# Patient Record
Sex: Female | Born: 1970 | Race: Black or African American | Hispanic: No | Marital: Single | Smoking: Never smoker
Health system: Southern US, Community
[De-identification: ages and names within clinical notes are randomized; demographics above are authoritative.]

## PROBLEM LIST (undated history)

## (undated) DIAGNOSIS — R112 Nausea with vomiting, unspecified: Secondary | ICD-10-CM

## (undated) DIAGNOSIS — T7840XA Allergy, unspecified, initial encounter: Secondary | ICD-10-CM

## (undated) DIAGNOSIS — E785 Hyperlipidemia, unspecified: Secondary | ICD-10-CM

## (undated) DIAGNOSIS — Z9889 Other specified postprocedural states: Secondary | ICD-10-CM

## (undated) DIAGNOSIS — D259 Leiomyoma of uterus, unspecified: Secondary | ICD-10-CM

## (undated) HISTORY — DX: Allergy, unspecified, initial encounter: T78.40XA

## (undated) HISTORY — PX: BREAST LUMPECTOMY: SHX2

## (undated) HISTORY — PX: BREAST EXCISIONAL BIOPSY: SUR124

---

## 1989-12-09 HISTORY — PX: BREAST LUMPECTOMY: SHX2

## 1991-12-10 HISTORY — PX: BREAST EXCISIONAL BIOPSY: SUR124

## 2005-08-28 ENCOUNTER — Observation Stay: Payer: Self-pay | Admitting: Obstetrics and Gynecology

## 2005-08-29 ENCOUNTER — Ambulatory Visit: Payer: Self-pay

## 2005-12-15 ENCOUNTER — Inpatient Hospital Stay: Payer: Self-pay | Admitting: Obstetrics and Gynecology

## 2006-02-06 ENCOUNTER — Encounter: Payer: Self-pay | Admitting: Neurology

## 2006-03-09 ENCOUNTER — Encounter: Payer: Self-pay | Admitting: Neurology

## 2006-05-28 DIAGNOSIS — D251 Intramural leiomyoma of uterus: Secondary | ICD-10-CM | POA: Insufficient documentation

## 2006-06-08 HISTORY — PX: ABDOMINAL HYSTERECTOMY: SHX81

## 2006-06-16 ENCOUNTER — Inpatient Hospital Stay: Payer: Self-pay | Admitting: Obstetrics and Gynecology

## 2007-08-03 DIAGNOSIS — E785 Hyperlipidemia, unspecified: Secondary | ICD-10-CM | POA: Insufficient documentation

## 2007-08-06 LAB — HIV ANTIBODY (ROUTINE TESTING W REFLEX): HIV 1&2 Ab, 4th Generation: NONREACTIVE

## 2007-09-09 ENCOUNTER — Ambulatory Visit: Payer: Self-pay | Admitting: Family Medicine

## 2010-02-21 ENCOUNTER — Ambulatory Visit: Payer: Self-pay | Admitting: Family Medicine

## 2014-04-03 ENCOUNTER — Emergency Department: Payer: Self-pay | Admitting: Emergency Medicine

## 2014-04-03 LAB — CBC WITH DIFFERENTIAL/PLATELET
Basophil #: 0 10*3/uL (ref 0.0–0.1)
Basophil %: 0.7 %
EOS PCT: 2.8 %
Eosinophil #: 0.2 10*3/uL (ref 0.0–0.7)
HCT: 39.7 % (ref 35.0–47.0)
HGB: 13.6 g/dL (ref 12.0–16.0)
LYMPHS ABS: 2 10*3/uL (ref 1.0–3.6)
Lymphocyte %: 33.3 %
MCH: 32.4 pg (ref 26.0–34.0)
MCHC: 34.2 g/dL (ref 32.0–36.0)
MCV: 95 fL (ref 80–100)
Monocyte #: 0.4 x10 3/mm (ref 0.2–0.9)
Monocyte %: 6.5 %
Neutrophil #: 3.4 10*3/uL (ref 1.4–6.5)
Neutrophil %: 56.7 %
PLATELETS: 242 10*3/uL (ref 150–440)
RBC: 4.2 10*6/uL (ref 3.80–5.20)
RDW: 13.3 % (ref 11.5–14.5)
WBC: 5.9 10*3/uL (ref 3.6–11.0)

## 2014-04-03 LAB — COMPREHENSIVE METABOLIC PANEL
ALBUMIN: 4.1 g/dL (ref 3.4–5.0)
ALK PHOS: 55 U/L
Anion Gap: 3 — ABNORMAL LOW (ref 7–16)
BUN: 9 mg/dL (ref 7–18)
Bilirubin,Total: 0.6 mg/dL (ref 0.2–1.0)
CO2: 31 mmol/L (ref 21–32)
CREATININE: 0.74 mg/dL (ref 0.60–1.30)
Calcium, Total: 9 mg/dL (ref 8.5–10.1)
Chloride: 104 mmol/L (ref 98–107)
EGFR (African American): >60
EGFR (Non-African Amer.): >60
Glucose: 90 mg/dL (ref 65–99)
Osmolality: 274 (ref 275–301)
POTASSIUM: 4.1 mmol/L (ref 3.5–5.1)
SGOT(AST): 26 U/L (ref 15–37)
SGPT (ALT): 20 U/L (ref 12–78)
SODIUM: 138 mmol/L (ref 136–145)
Total Protein: 7.9 g/dL (ref 6.4–8.2)

## 2014-04-03 LAB — URINALYSIS, COMPLETE
Bacteria: NONE SEEN
Bilirubin,UR: NEGATIVE
Glucose,UR: NEGATIVE mg/dL (ref 0–75)
Ketone: NEGATIVE
Leukocyte Esterase: NEGATIVE
Nitrite: NEGATIVE
PH: 6 (ref 4.5–8.0)
PROTEIN: NEGATIVE
SPECIFIC GRAVITY: 1.009 (ref 1.003–1.030)
Squamous Epithelial: 9
WBC UR: <1 /HPF (ref 0–5)

## 2014-04-03 LAB — TROPONIN I: Troponin-I: <0.02 ng/mL

## 2014-04-03 LAB — LIPASE, BLOOD: Lipase: 91 U/L (ref 73–393)

## 2014-09-29 LAB — HEPATIC FUNCTION PANEL
ALT: 21 (ref 7–35)
AST: 25 (ref 13–35)

## 2014-09-29 LAB — BASIC METABOLIC PANEL
BUN: 9 (ref 4–21)
Creatinine: 0.7 (ref 0.5–1.1)
GLUCOSE: 92
Potassium: 4.2 (ref 3.4–5.3)
Sodium: 141 (ref 137–147)

## 2014-09-29 LAB — LIPID PANEL
CHOLESTEROL: 221 — AB (ref 0–200)
HDL: 46 (ref 35–70)
LDL CALC: 160
Triglycerides: 76 (ref 40–160)

## 2014-10-11 ENCOUNTER — Ambulatory Visit: Payer: Self-pay | Admitting: Family Medicine

## 2014-10-27 ENCOUNTER — Ambulatory Visit: Payer: Self-pay | Admitting: Family Medicine

## 2014-11-11 ENCOUNTER — Ambulatory Visit: Payer: Self-pay | Admitting: Family Medicine

## 2014-11-11 DIAGNOSIS — N63 Unspecified lump in unspecified breast: Secondary | ICD-10-CM | POA: Insufficient documentation

## 2015-07-30 IMAGING — CR DG CHEST 2V
1 series · 2 of 2 positions shown · non-contrast
Comparison: None.

CLINICAL DATA: sharp pain under rt breast circles around to back
intense pain comes and goes dull ache constant

EXAM:
CHEST  2 VIEW

[Series 1: w chest pa · 0.14mm/px · 2 of 2 slices shown]
[im 1/2]
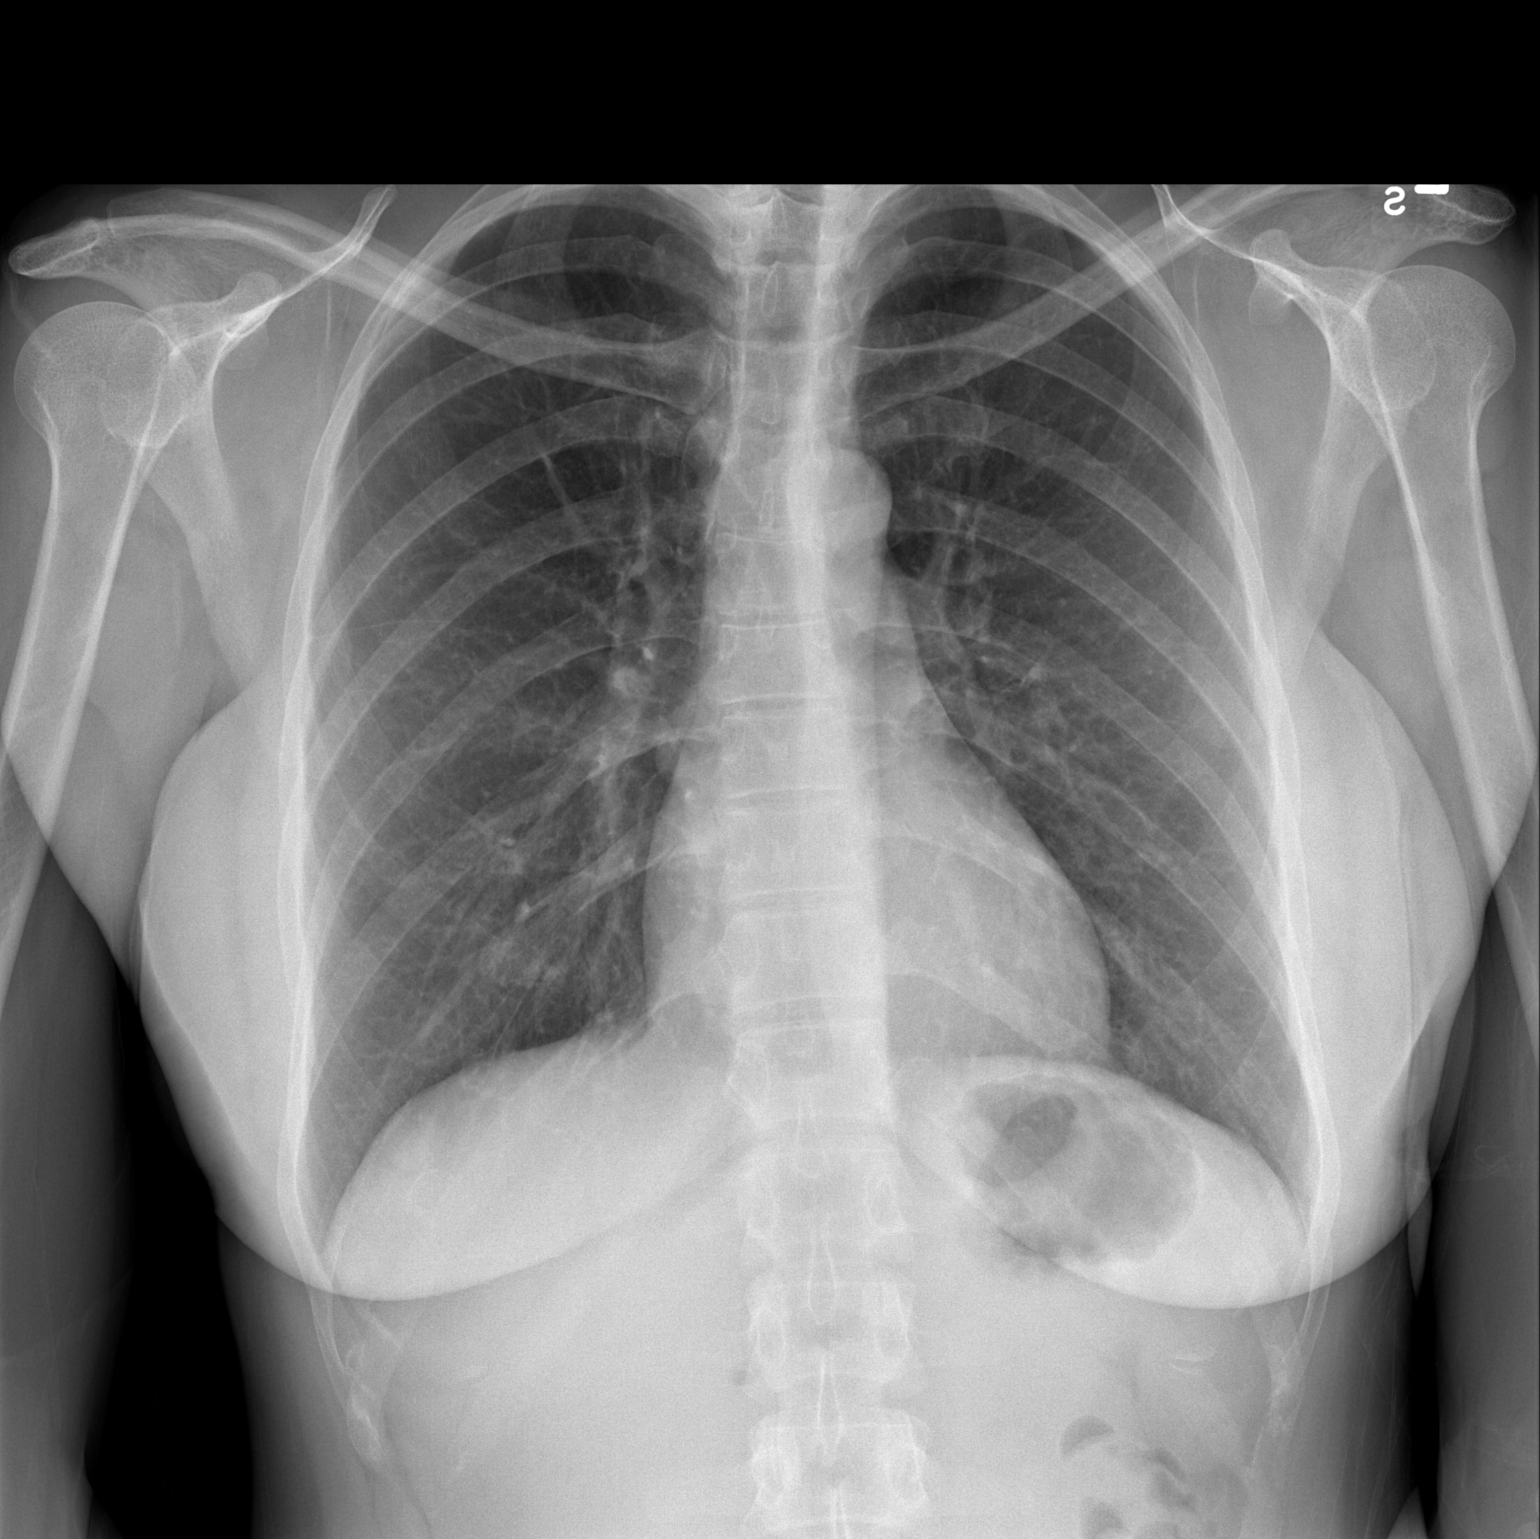
[im 2/2]
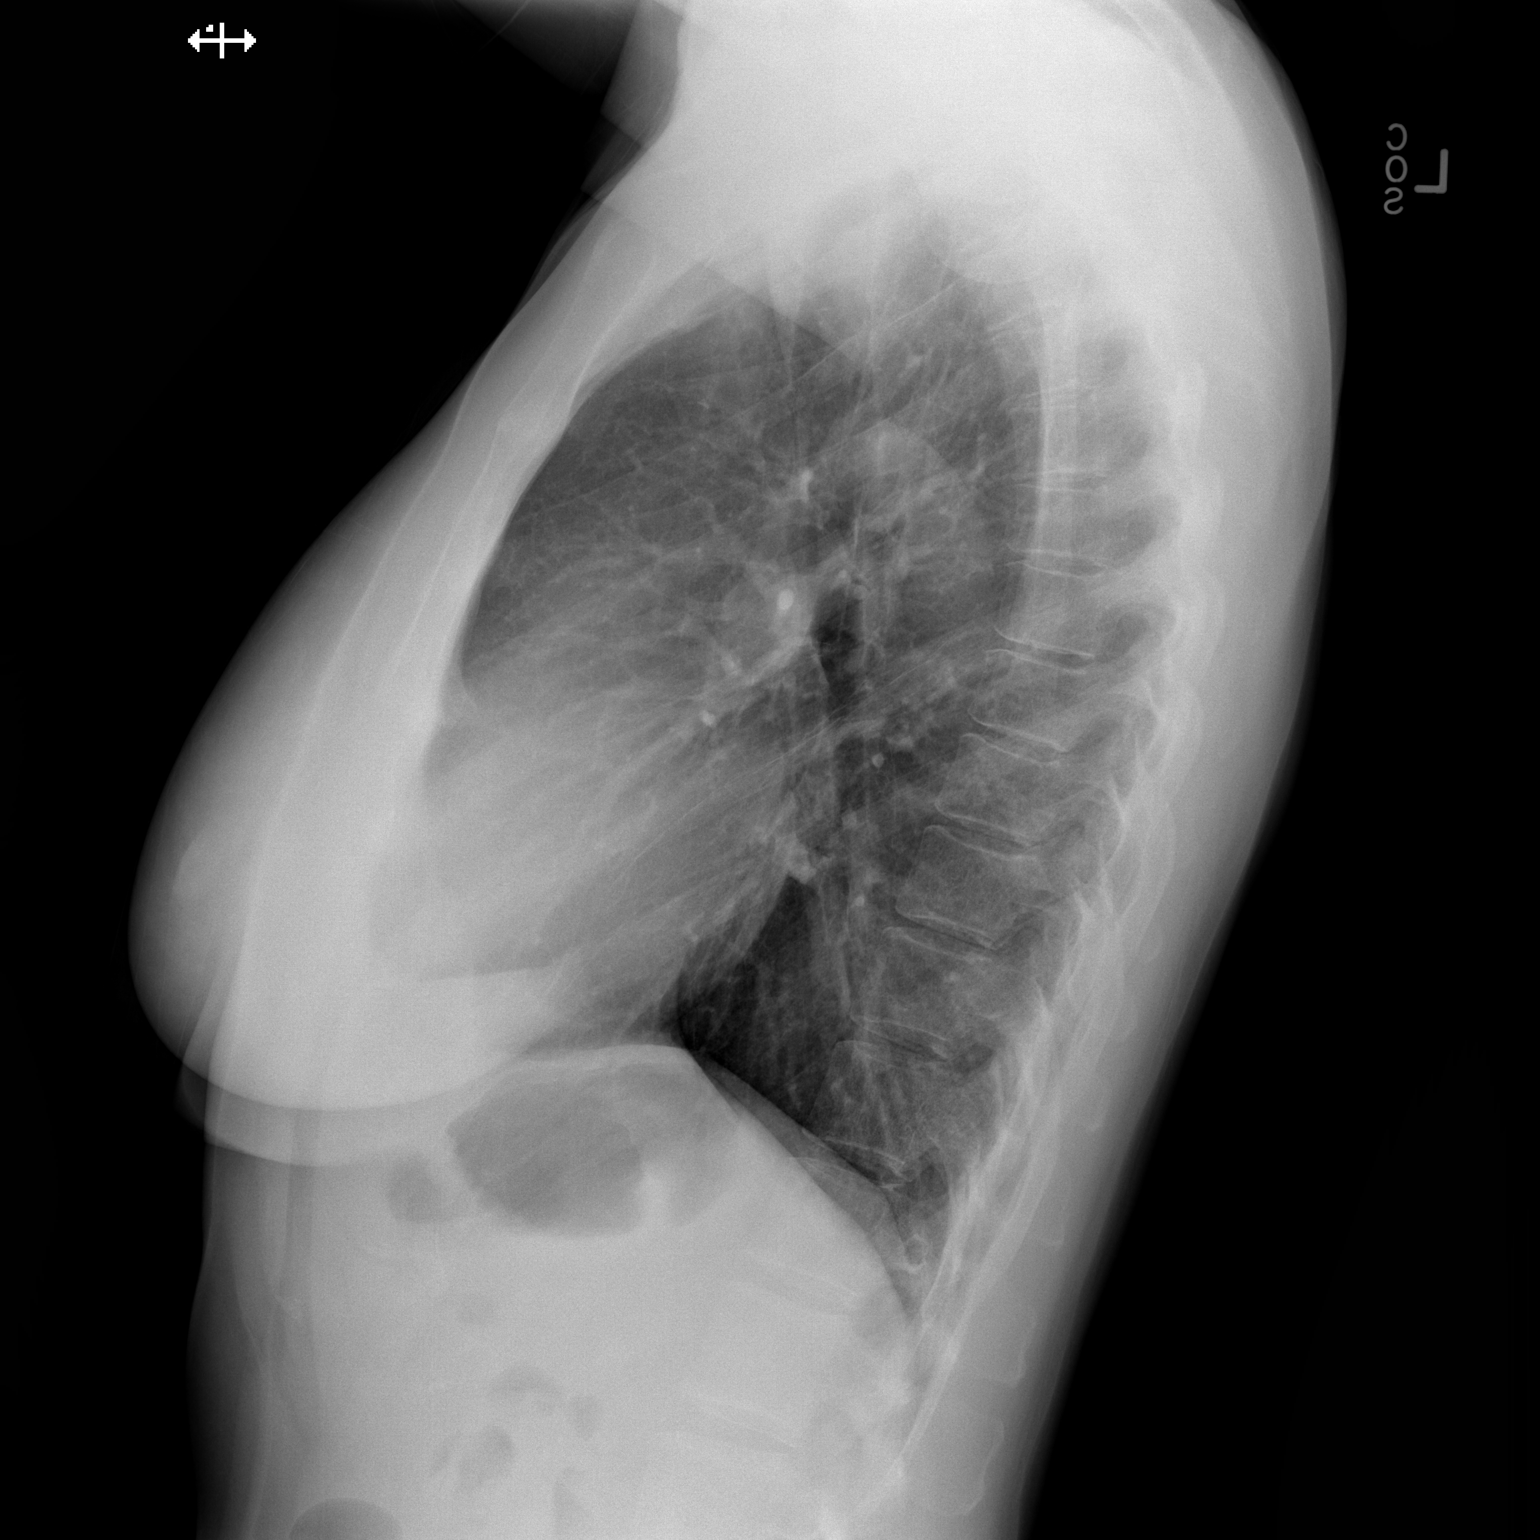

[2 of 2 positions shown; findings below may reference images not displayed]

FINDINGS: Left lung clear. Cardiopericardial silhouette and mediastinal
contours appear normal.

There is a nodular density measuring about 14 mm at the right lung
base. Repeat frontal with a chest with nipple markers is
recommended.

No airspace disease or pleural effusion.
IMPRESSION: 1. 14 mm nodular density at the right lung base. Repeat frontal view
of the chest with nipple markers recommended. While this may
represent nipple shadow, confluence of rib end and vascular shadows,
a pulmonary nodule cannot be excluded.
2. No acute cardiopulmonary disease.

## 2016-02-06 IMAGING — US ABDOMEN ULTRASOUND
1 series · 14 of 25 positions shown · non-contrast
Comparison: None.

CLINICAL DATA: Generalized abdominal pain intermittently for 1
year.

EXAM:
ULTRASOUND ABDOMEN COMPLETE

[Series 1: abdomen ultrasound · 0.27mm/px · 14 of 111 slices shown]
[im 1/111]
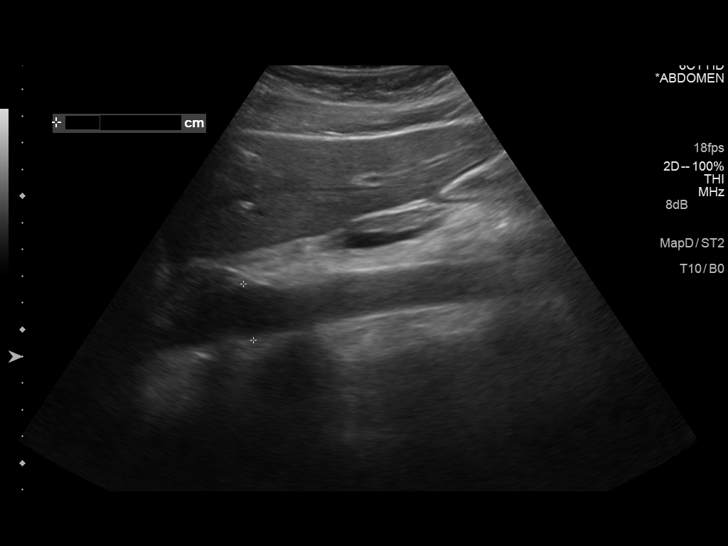
[im 10/111]
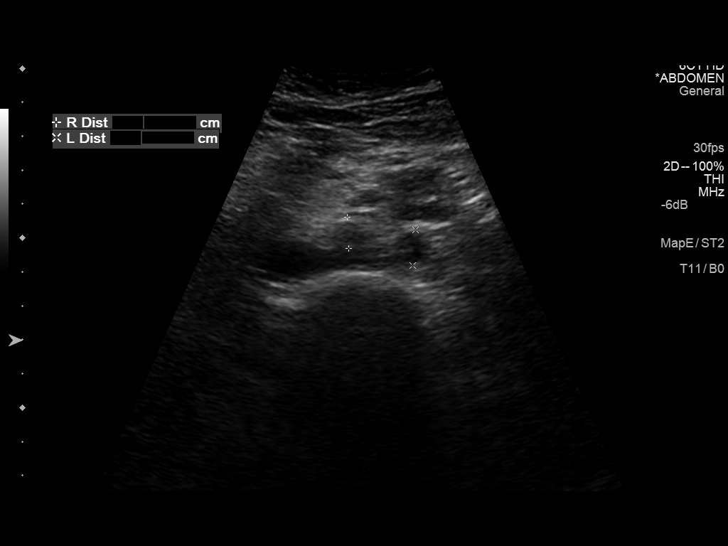
[im 19/111]
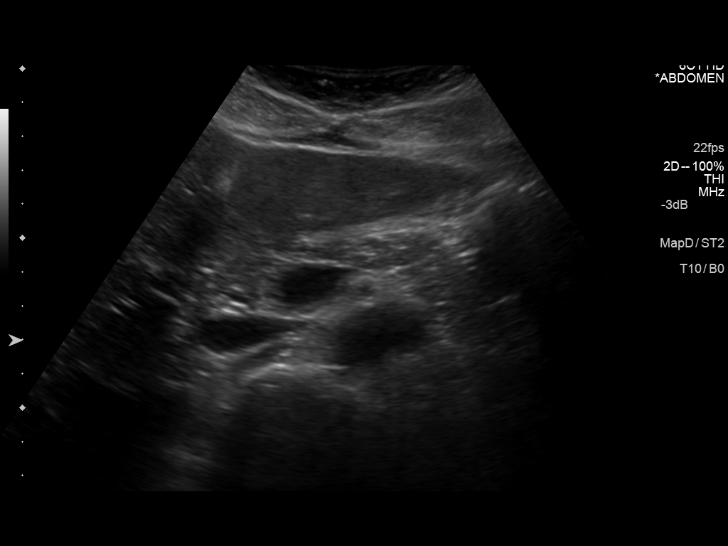
[im 28/111]
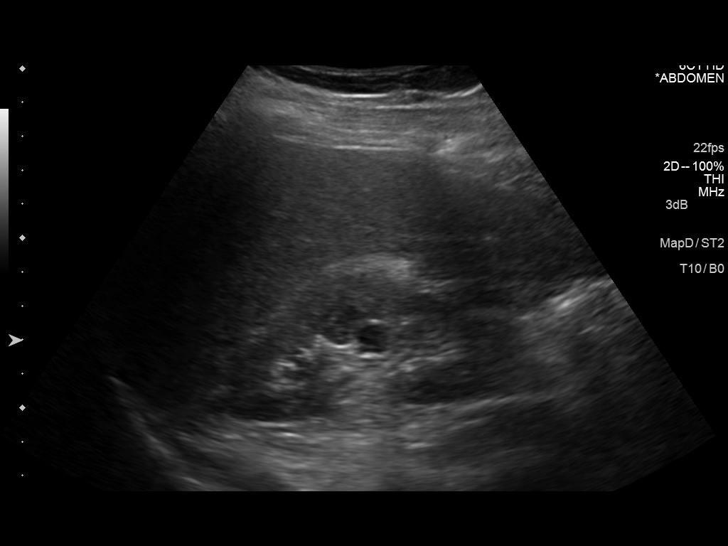
[im 37/111]
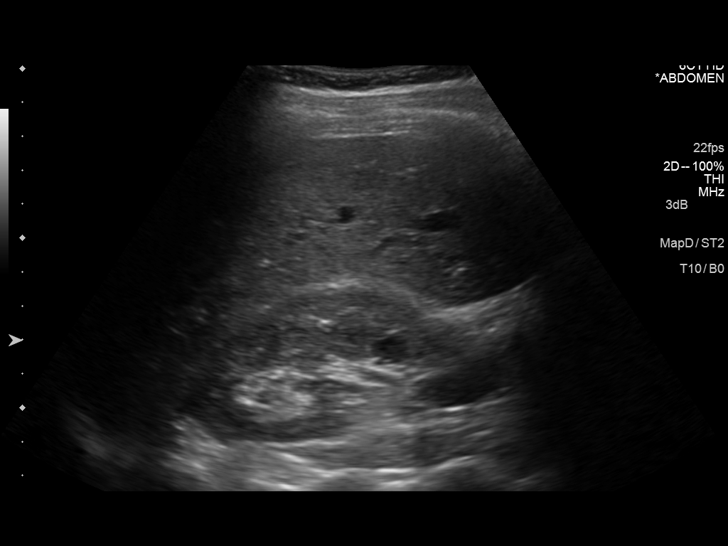
[im 42/111]
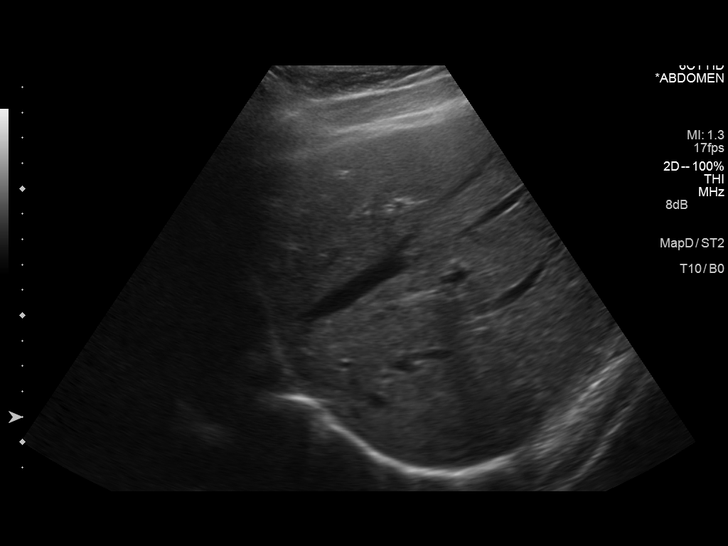
[im 51/111]
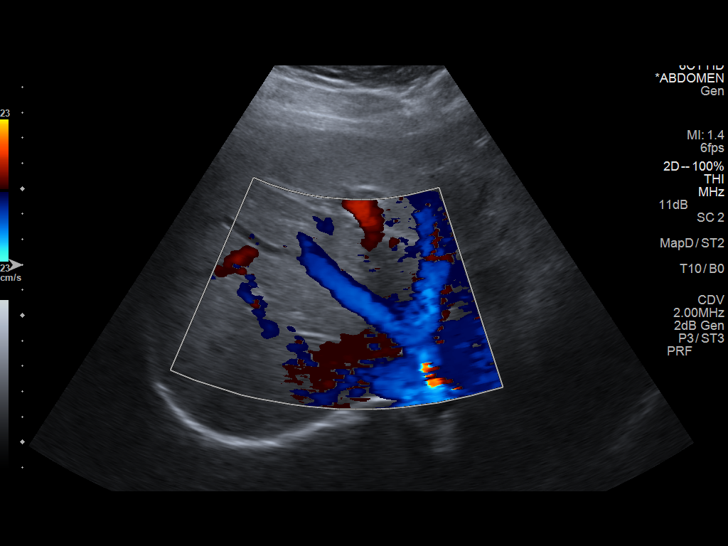
[im 60/111]
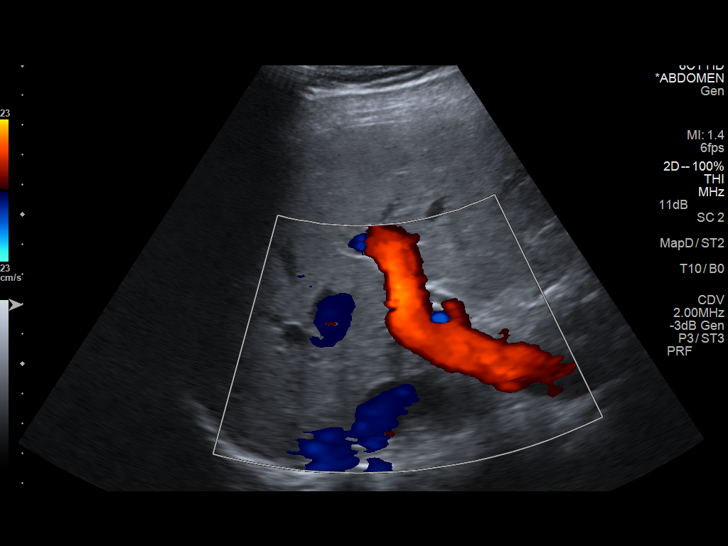
[im 69/111]
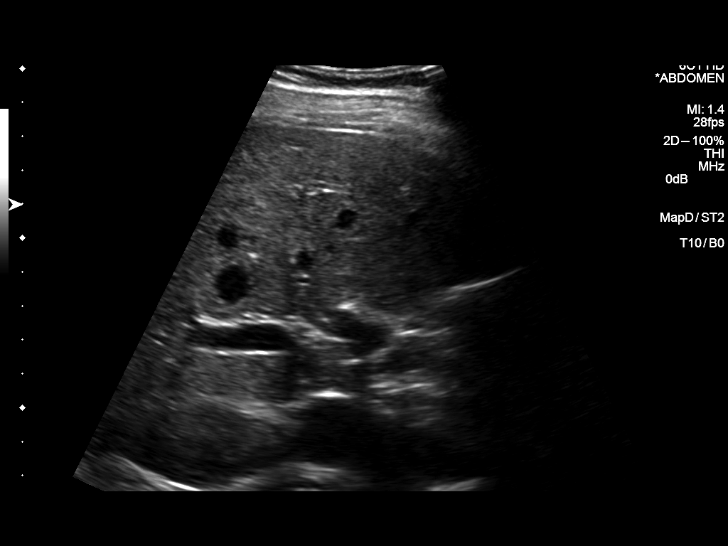
[im 74/111]
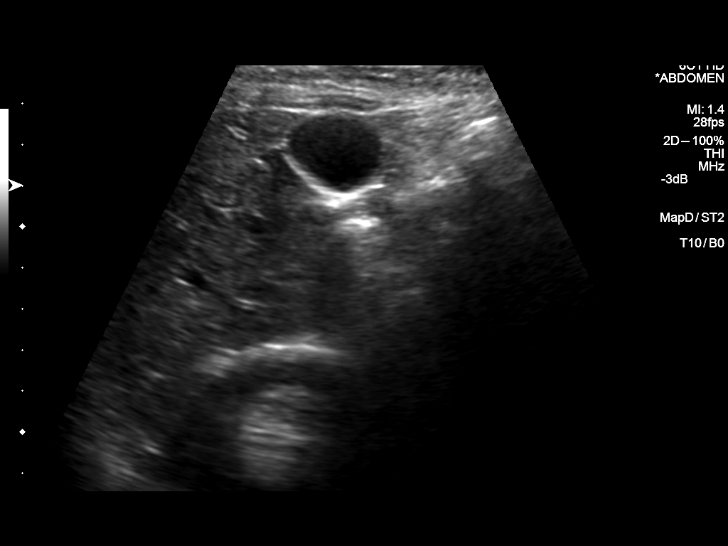
[im 83/111]
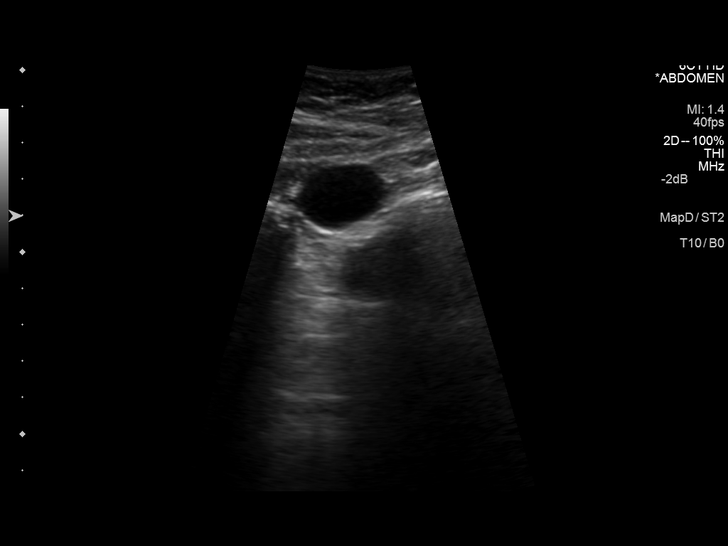
[im 92/111]
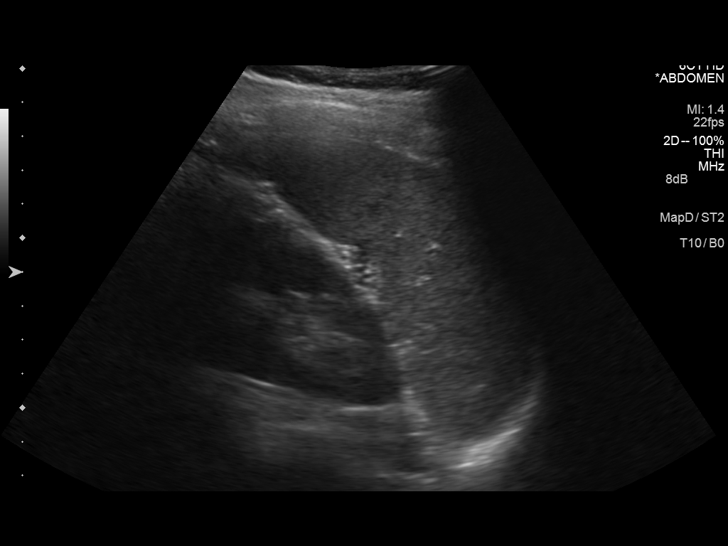
[im 101/111]
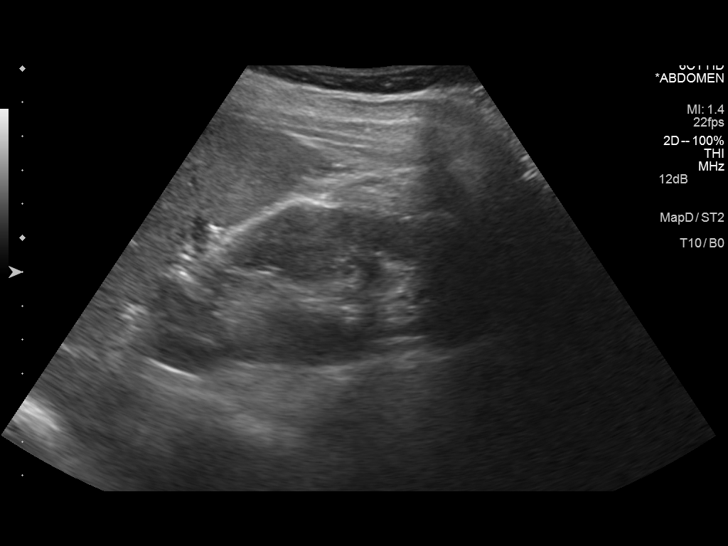
[im 111/111]
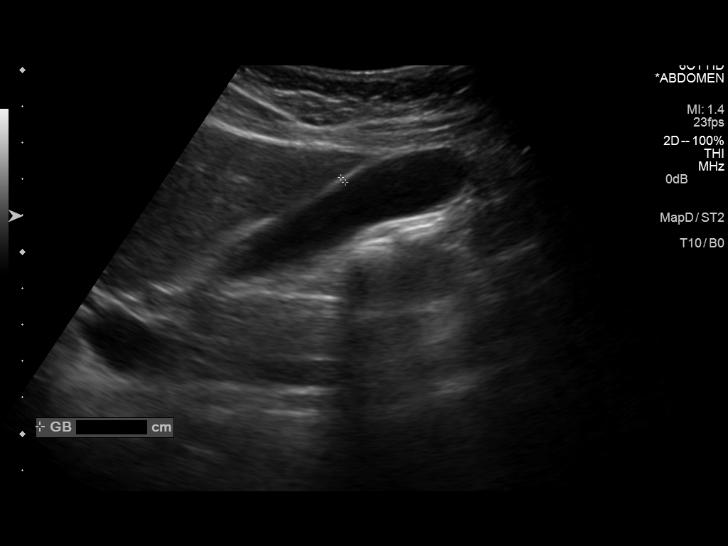

[14 of 25 positions shown; findings below may reference images not displayed]

FINDINGS: Gallbladder: No gallstones. 4 mm echogenic polyp lies along 1 wall.
No wall thickening. No pericholecystic fluid.

Common bile duct: Diameter: 4.3 mm

Liver: No focal lesion identified. Within normal limits in
parenchymal echogenicity.

IVC: No abnormality visualized.

Pancreas: Visualized portion unremarkable.

Spleen: Size and appearance within normal limits.

Right Kidney: Length: 11.3 cm. Echogenicity within normal limits. No
mass or hydronephrosis visualized.

Left Kidney: Length: 11.3 cm. Normal parenchymal echogenicity. 15 mm
simple appearing lower pole cyst. No other renal masses. No
hydronephrosis.

Abdominal aorta: No aneurysm visualized.

Other findings: None.
IMPRESSION: 1. No acute findings.
2. Small echogenic gallbladder polyp. Small lower pole left renal
cyst. No other abnormalities.

## 2017-10-29 DIAGNOSIS — M5430 Sciatica, unspecified side: Secondary | ICD-10-CM | POA: Insufficient documentation

## 2017-11-03 ENCOUNTER — Encounter: Payer: Self-pay | Admitting: Family Medicine

## 2017-11-03 ENCOUNTER — Other Ambulatory Visit: Payer: Self-pay | Admitting: Family Medicine

## 2017-11-03 ENCOUNTER — Ambulatory Visit (INDEPENDENT_AMBULATORY_CARE_PROVIDER_SITE_OTHER): Payer: Managed Care, Other (non HMO) | Admitting: Family Medicine

## 2017-11-03 ENCOUNTER — Telehealth: Payer: Self-pay | Admitting: Family Medicine

## 2017-11-03 VITALS — BP 120/84 | HR 60 | Temp 98.4°F | Resp 16 | Ht 72.0 in | Wt 164.0 lb

## 2017-11-03 DIAGNOSIS — Z1231 Encounter for screening mammogram for malignant neoplasm of breast: Secondary | ICD-10-CM

## 2017-11-03 DIAGNOSIS — Z0000 Encounter for general adult medical examination without abnormal findings: Secondary | ICD-10-CM

## 2017-11-03 DIAGNOSIS — Z87898 Personal history of other specified conditions: Secondary | ICD-10-CM

## 2017-11-03 LAB — COMPLETE METABOLIC PANEL WITH GFR
AG RATIO: 1.8 (calc) (ref 1.0–2.5)
ALKALINE PHOSPHATASE (APISO): 52 U/L (ref 33–115)
ALT: 17 U/L (ref 6–29)
AST: 19 U/L (ref 10–35)
Albumin: 4.5 g/dL (ref 3.6–5.1)
BILIRUBIN TOTAL: 0.6 mg/dL (ref 0.2–1.2)
BUN: 7 mg/dL (ref 7–25)
CO2: 34 mmol/L — AB (ref 20–32)
CREATININE: 0.62 mg/dL (ref 0.50–1.10)
Calcium: 9.3 mg/dL (ref 8.6–10.2)
Chloride: 104 mmol/L (ref 98–110)
GFR, Est African American: 124 mL/min/{1.73_m2} (ref >=60)
GFR, Est Non African American: 107 mL/min/{1.73_m2} (ref >=60)
GLOBULIN: 2.5 g/dL (ref 1.9–3.7)
Glucose, Bld: 88 mg/dL (ref 65–99)
Potassium: 4.1 mmol/L (ref 3.5–5.3)
SODIUM: 140 mmol/L (ref 135–146)
Total Protein: 7 g/dL (ref 6.1–8.1)

## 2017-11-03 LAB — LIPID PANEL
Cholesterol: 233 mg/dL — ABNORMAL HIGH (ref <200)
HDL: 55 mg/dL (ref >50)
LDL Cholesterol (Calc): 160 mg/dL (calc) — ABNORMAL HIGH
NON-HDL CHOLESTEROL (CALC): 178 mg/dL — AB (ref <130)
TRIGLYCERIDES: 76 mg/dL (ref <150)
Total CHOL/HDL Ratio: 4.2 (calc) (ref <5.0)

## 2017-11-03 LAB — CBC
HEMATOCRIT: 37.5 % (ref 35.0–45.0)
HEMOGLOBIN: 12.8 g/dL (ref 11.7–15.5)
MCH: 31.2 pg (ref 27.0–33.0)
MCHC: 34.1 g/dL (ref 32.0–36.0)
MCV: 91.5 fL (ref 80.0–100.0)
MPV: 9.5 fL (ref 7.5–12.5)
Platelets: 283 10*3/uL (ref 140–400)
RBC: 4.1 10*6/uL (ref 3.80–5.10)
RDW: 12.4 % (ref 11.0–15.0)
WBC: 6.5 10*3/uL (ref 3.8–10.8)

## 2017-11-03 NOTE — Patient Instructions (Signed)
Preventive Care 40-64 Years, Female Preventive care refers to lifestyle choices and visits with your health care provider that can promote health and wellness. What does preventive care include?  A yearly physical exam. This is also called an annual well check.  Dental exams once or twice a year.  Routine eye exams. Ask your health care provider how often you should have your eyes checked.  Personal lifestyle choices, including: ? Daily care of your teeth and gums. ? Regular physical activity. ? Eating a healthy diet. ? Avoiding tobacco and drug use. ? Limiting alcohol use. ? Practicing safe sex. ? Taking low-dose aspirin daily starting at age 58. ? Taking vitamin and mineral supplements as recommended by your health care provider. What happens during an annual well check? The services and screenings done by your health care provider during your annual well check will depend on your age, overall health, lifestyle risk factors, and family history of disease. Counseling Your health care provider may ask you questions about your:  Alcohol use.  Tobacco use.  Drug use.  Emotional well-being.  Home and relationship well-being.  Sexual activity.  Eating habits.  Work and work Statistician.  Method of birth control.  Menstrual cycle.  Pregnancy history.  Screening You may have the following tests or measurements:  Height, weight, and BMI.  Blood pressure.  Lipid and cholesterol levels. These may be checked every 5 years, or more frequently if you are over 81 years old.  Skin check.  Lung cancer screening. You may have this screening every year starting at age 78 if you have a 30-pack-year history of smoking and currently smoke or have quit within the past 15 years.  Fecal occult blood test (FOBT) of the stool. You may have this test every year starting at age 65.  Flexible sigmoidoscopy or colonoscopy. You may have a sigmoidoscopy every 5 years or a colonoscopy  every 10 years starting at age 30.  Hepatitis C blood test.  Hepatitis B blood test.  Sexually transmitted disease (STD) testing.  Diabetes screening. This is done by checking your blood sugar (glucose) after you have not eaten for a while (fasting). You may have this done every 1-3 years.  Mammogram. This may be done every 1-2 years. Talk to your health care provider about when you should start having regular mammograms. This may depend on whether you have a family history of breast cancer.  BRCA-related cancer screening. This may be done if you have a family history of breast, ovarian, tubal, or peritoneal cancers.  Pelvic exam and Pap test. This may be done every 3 years starting at age 80. Starting at age 36, this may be done every 5 years if you have a Pap test in combination with an HPV test.  Bone density scan. This is done to screen for osteoporosis. You may have this scan if you are at high risk for osteoporosis.  Discuss your test results, treatment options, and if necessary, the need for more tests with your health care provider. Vaccines Your health care provider may recommend certain vaccines, such as:  Influenza vaccine. This is recommended every year.  Tetanus, diphtheria, and acellular pertussis (Tdap, Td) vaccine. You may need a Td booster every 10 years.  Varicella vaccine. You may need this if you have not been vaccinated.  Zoster vaccine. You may need this after age 5.  Measles, mumps, and rubella (MMR) vaccine. You may need at least one dose of MMR if you were born in  1957 or later. You may also need a second dose.  Pneumococcal 13-valent conjugate (PCV13) vaccine. You may need this if you have certain conditions and were not previously vaccinated.  Pneumococcal polysaccharide (PPSV23) vaccine. You may need one or two doses if you smoke cigarettes or if you have certain conditions.  Meningococcal vaccine. You may need this if you have certain  conditions.  Hepatitis A vaccine. You may need this if you have certain conditions or if you travel or work in places where you may be exposed to hepatitis A.  Hepatitis B vaccine. You may need this if you have certain conditions or if you travel or work in places where you may be exposed to hepatitis B.  Haemophilus influenzae type b (Hib) vaccine. You may need this if you have certain conditions.  Talk to your health care provider about which screenings and vaccines you need and how often you need them. This information is not intended to replace advice given to you by your health care provider. Make sure you discuss any questions you have with your health care provider. Document Released: 12/22/2015 Document Revised: 08/14/2016 Document Reviewed: 09/26/2015 Elsevier Interactive Patient Education  2017 [employer].

## 2017-11-03 NOTE — Telephone Encounter (Signed)
Please schedule mammogram. Patient needs appt to be either early in the morning or late in the afternoon. Thank you!

## 2017-11-03 NOTE — Progress Notes (Signed)
Patient: Angelica Dougherty, Female    DOB: 1971/03/11, 46 y.o.   MRN: [id number] Visit Date: 11/03/2017  Today's Provider: Mila Merry, MD   Chief Complaint  Patient presents with  . Annual Exam  . Hyperlipidemia   Subjective:    Annual physical exam Vennesa Dougherty is a 46 y.o. female who presents today for health maintenance and complete physical. She feels well. She reports exercising none. She reports she is sleeping well.  ----------------------------------------------------------------  Hyperlipidemia  From 09/28/2014-labs checked. Recommended she cut back on saturated fats. Lab Results  Component Value Date   CHOL 221 (A) 09/29/2014   HDL 46 09/29/2014   LDLCALC 160 09/29/2014   TRIG 76 09/29/2014      Review of Systems  Constitutional: Negative for chills, diaphoresis and fever.  HENT: Negative for congestion, ear discharge, ear pain, hearing loss, nosebleeds, sore throat and tinnitus.   Eyes: Negative for photophobia, pain, discharge and redness.  Respiratory: Negative for cough, shortness of breath, wheezing and stridor.   Cardiovascular: Negative for chest pain, palpitations and leg swelling.  Gastrointestinal: Negative for abdominal pain, blood in stool, constipation, diarrhea, nausea and vomiting.  Endocrine: Negative for polydipsia.  Genitourinary: Negative for dysuria, flank pain, frequency, hematuria and urgency.  Musculoskeletal: Negative for back pain, myalgias and neck pain.  Skin: Negative for rash.  Allergic/Immunologic: Negative for environmental allergies.  Neurological: Negative for dizziness, tremors, seizures, weakness and headaches.  Hematological: Does not bruise/bleed easily.  Psychiatric/Behavioral: Negative for hallucinations and suicidal ideas. The patient is not nervous/anxious.   All other systems reviewed and are negative.   Social History      She  reports that  has never smoked. she has never used smokeless tobacco.  She reports that she does not drink alcohol or use drugs.       Social History   Socioeconomic History  . Marital status: Single    Spouse name: None  . Number of children: 1  . Years of education: None  . Highest education level: None  Social Needs  . Financial resource strain: None  . Food insecurity - worry: None  . Food insecurity - inability: None  . Transportation needs - medical: None  . Transportation needs - non-medical: None  Occupational History  . None  Tobacco Use  . Smoking status: Never Smoker  . Smokeless tobacco: Never Used  Substance and Sexual Activity  . Alcohol use: No    Frequency: Never  . Drug use: No  . Sexual activity: None  Other Topics Concern  . None  Social History Narrative  . None    History reviewed. No pertinent past medical history.   Patient Active Problem List   Diagnosis Date Noted  . 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 Breast mass seen on mammogram 11/11/2014  . HLD (hyperlipidemia) 08/03/2007  . Intramural leiomyoma of uterus 05/28/2006    Past Surgical History:  Procedure Laterality Date  . ABDOMINAL HYSTERECTOMY  06/2006   Total hysterctomy due to excessive bleeding and fibroids per patient report  . CESAREAN SECTION  12/16/2005    Family History        Family Status  Relation Name Status  . Mother  Alive  . Father  Alive  . Emelda Brothers  (Not Specified)        Her family history includes Breast cancer in her paternal aunt.     Allergies  Allergen Reactions  . Codeine  Swelling    No current outpatient medications on file.   Patient Care Team: Malva Limes, MD as PCP - General (Family Medicine)      Objective:   Vitals: BP 120/84 (BP Location: Right Arm, Patient Position: Sitting, Cuff Size: Normal)   Pulse 60   Temp 98.4 F (36.9 C) (Oral)   Resp 16   Ht 6' (1.829 m)   Wt 164 lb (74.4 kg)   LMP 06/08/2006 (Approximate) Comment: Hysterectomy  SpO2 97%   BMI 22.24 kg/m     Vitals:   11/03/17 0921  BP: 120/84  Pulse: 60  Resp: 16  Temp: 98.4 F (36.9 C)  TempSrc: Oral  SpO2: 97%  Weight: 164 lb (74.4 kg)  Height: 6' (1.829 m)     Physical Exam   General Appearance:    Alert, cooperative, no distress, appears stated age  Head:    Normocephalic, without obvious abnormality, atraumatic  Eyes:    PERRL, conjunctiva/corneas clear, EOM's intact, fundi    benign, both eyes  Ears:    Normal TM's and external ear canals, both ears  Nose:   Nares normal, septum midline, mucosa normal, no drainage    or sinus tenderness  Throat:   Lips, mucosa, and tongue normal; teeth and gums normal  Neck:   Supple, symmetrical, trachea midline, no adenopathy;    thyroid:  no enlargement/tenderness/nodules; no carotid   bruit or JVD  Back:     Symmetric, no curvature, ROM normal, no CVA tenderness  Lungs:     Clear to auscultation bilaterally, respirations unlabored  Chest Wall:    No tenderness or deformity   Heart:    Regular rate and rhythm, S1 and S2 normal, no murmur, rub   or gallop  Breast Exam:    normal appearance, no masses or tenderness  Abdomen:     Soft, non-tender, bowel sounds active all four quadrants,    no masses, no organomegaly  Pelvic:    deferred  Extremities:   Extremities normal, atraumatic, no cyanosis or edema  Pulses:   2+ and symmetric all extremities  Skin:   Skin color, texture, turgor normal, no rashes or lesions  Lymph nodes:   Cervical, supraclavicular, and axillary nodes normal  Neurologic:   CNII-XII intact, normal strength, sensation and reflexes    throughout    Depression Screen PHQ 2/9 Scores 11/03/2017  PHQ - 2 Score 0  PHQ- 9 Score 0      Assessment & Plan:     Routine Health Maintenance and Physical Exam  Exercise Activities and Dietary recommendations Goals    None      Immunization History  Administered Date(s) Administered  . Tdap 09/28/2014    Health Maintenance  Topic Date Due  . INFLUENZA  VACCINE  07/09/2018 (Originally 07/09/2017)  . TETANUS/TDAP  09/28/2024  . HIV Screening  Completed  . PAP SMEAR  Discontinued     Discussed health benefits of physical activity, and encouraged her to engage in regular exercise appropriate for her age and condition.    --------------------------------------------------------------------  1. Annual physical exam Doing well, normal exam. Recommended flu vaccine which she declined.  - COMPLETE METABOLIC PANEL WITH GFR - CBC - Lipid panel  Patient given contact information to schedule mammogram.    Mila Merry, MD  [facility]

## 2017-11-03 NOTE — Telephone Encounter (Signed)
Pt called [name] to st up an appointement for a mammogram.  Angelica Dougherty is requesting an order for a  diagnostic bilateral mammogram.  [phone]/[id number]

## 2017-11-04 NOTE — Telephone Encounter (Signed)
[facility] at 3[facility].Pt advised

## 2017-12-12 ENCOUNTER — Other Ambulatory Visit: Payer: Self-pay

## 2017-12-31 ENCOUNTER — Ambulatory Visit
Admission: RE | Admit: 2017-12-31 | Discharge: 2017-12-31 | Disposition: A | Discharge status: Home / Self Care | Payer: BLUE CROSS/BLUE SHIELD | Source: Ambulatory Visit | Attending: Family Medicine | Admitting: Family Medicine

## 2017-12-31 ENCOUNTER — Encounter: Payer: Self-pay | Admitting: Radiology

## 2017-12-31 ENCOUNTER — Other Ambulatory Visit: Payer: Self-pay | Admitting: Family Medicine

## 2017-12-31 DIAGNOSIS — Z87898 Personal history of other specified conditions: Secondary | ICD-10-CM

## 2017-12-31 DIAGNOSIS — R928 Other abnormal and inconclusive findings on diagnostic imaging of breast: Secondary | ICD-10-CM | POA: Diagnosis present

## 2017-12-31 DIAGNOSIS — Z1231 Encounter for screening mammogram for malignant neoplasm of breast: Secondary | ICD-10-CM

## 2017-12-31 DIAGNOSIS — N6311 Unspecified lump in the right breast, upper outer quadrant: Secondary | ICD-10-CM | POA: Diagnosis not present

## 2019-08-17 IMAGING — US US BREAST*R* LIMITED INC AXILLA
1 series · 9 of 9 positions shown · non-contrast
Comparison: Previous exam(s).

CLINICAL DATA: Patient had a diagnostic study on 11/11/2014 where a
probable benign lesion was seen in the right breast. The patient did
not return for short-term interval follow-up.

EXAM:
2D DIGITAL DIAGNOSTIC BILATERAL MAMMOGRAM WITH CAD AND ADJUNCT TOMO
ULTRASOUND RIGHT BREAST

[Series 1: us breast*right* limited inc axilla · 0.06mm/px · 9 of 9 slices shown]
[im 1/9]
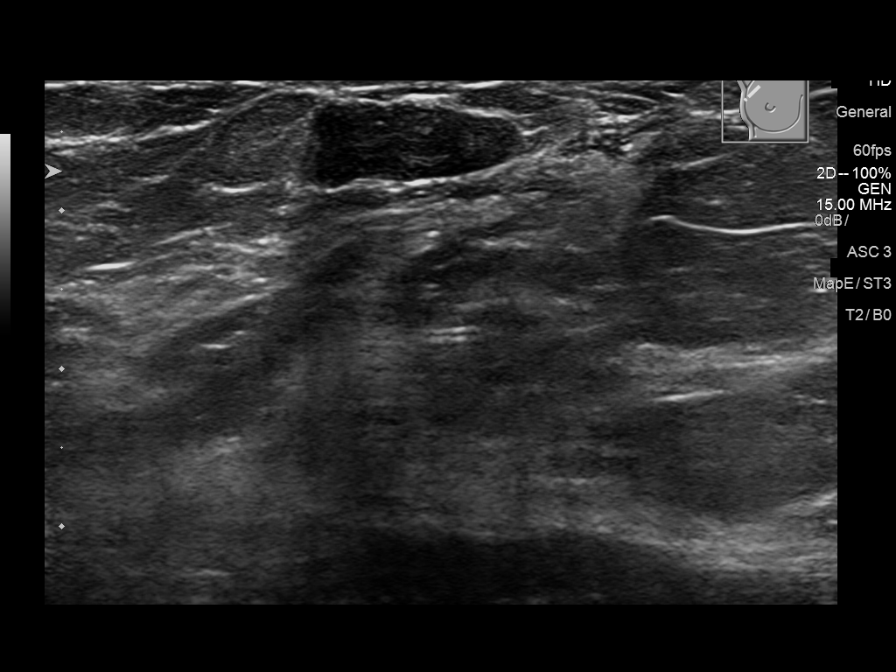
[im 2/9]
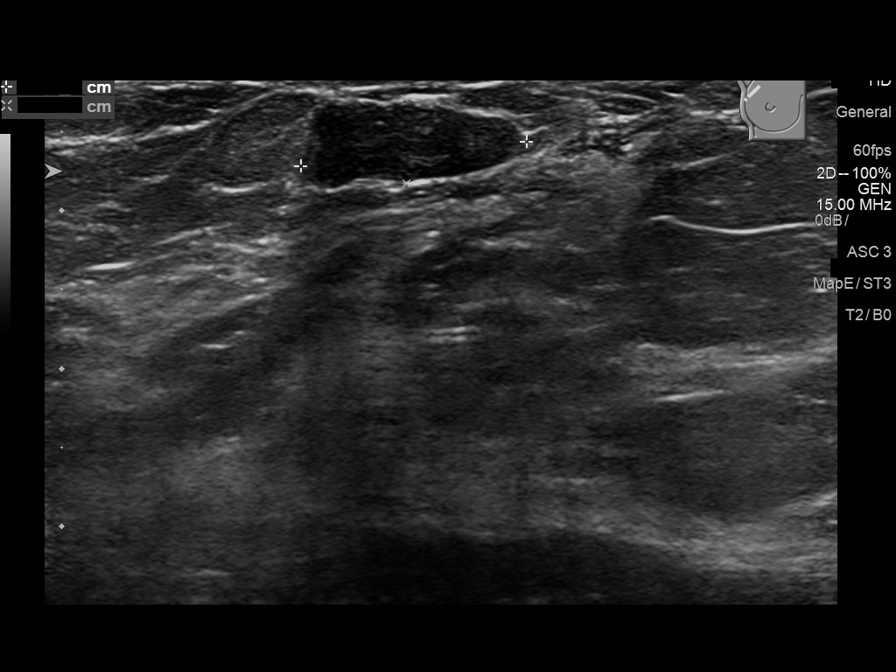
[im 3/9]
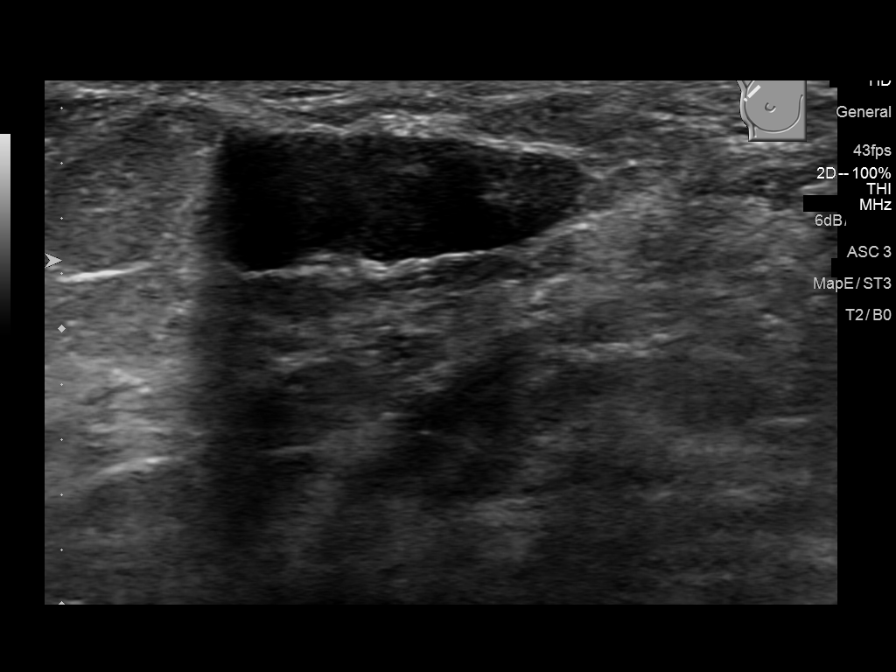
[im 4/9]
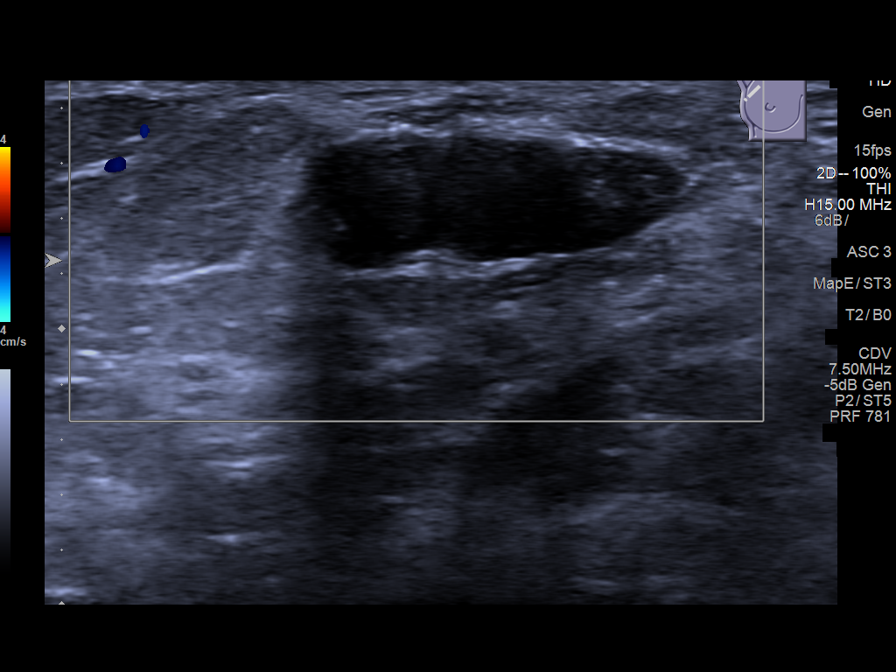
[im 5/9]
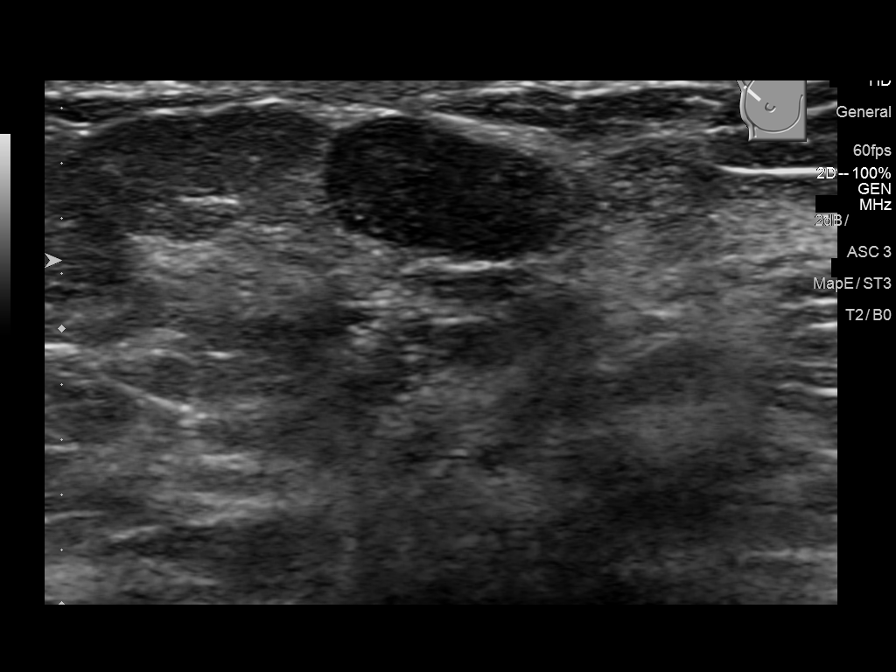
[im 6/9]
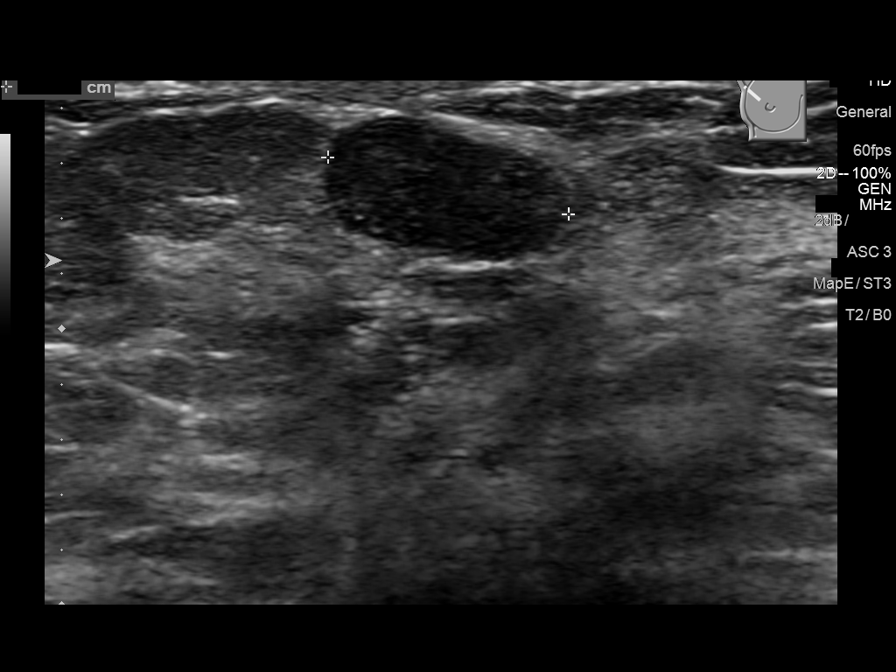
[im 7/9]
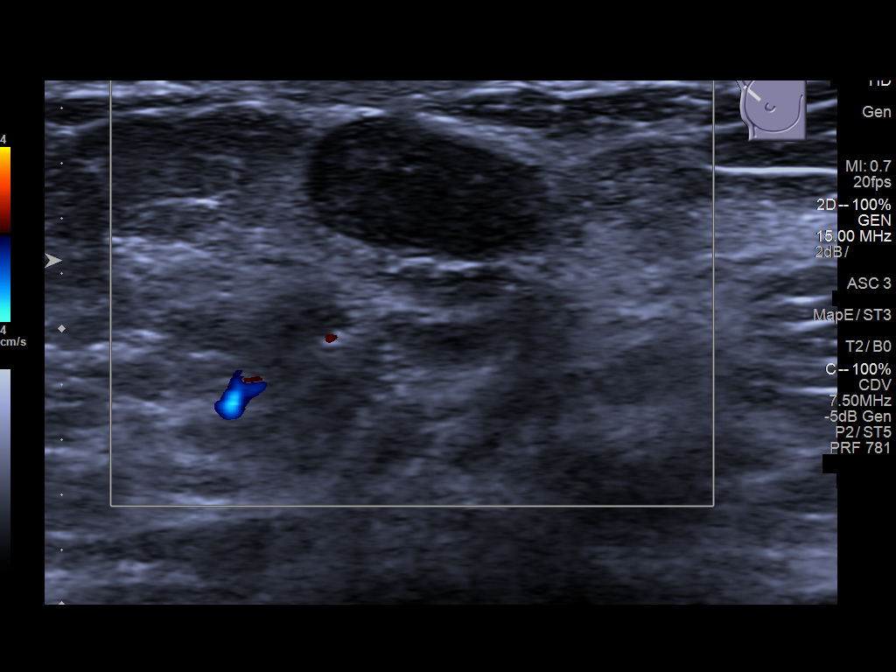
[im 8/9]
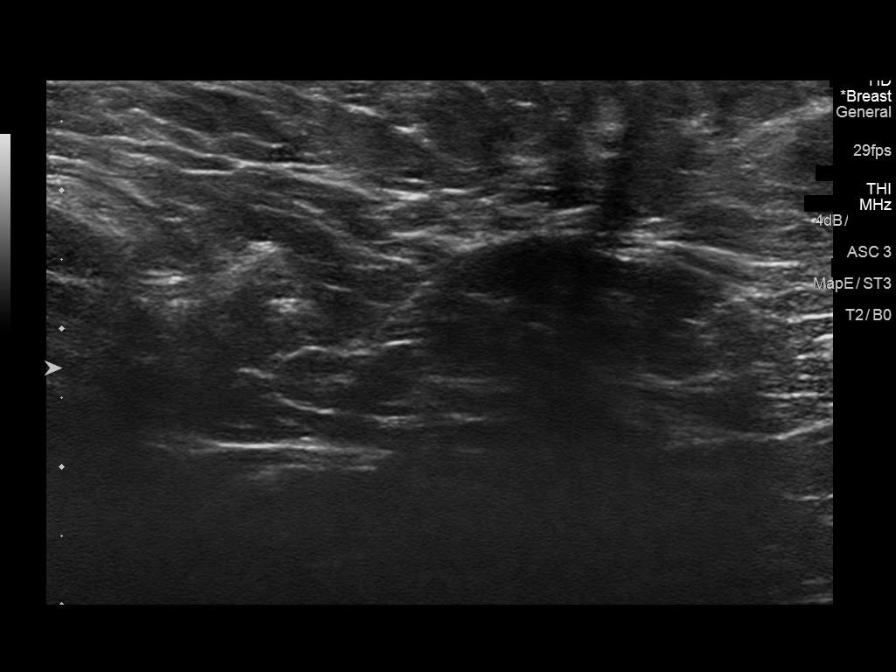
[im 9/9]
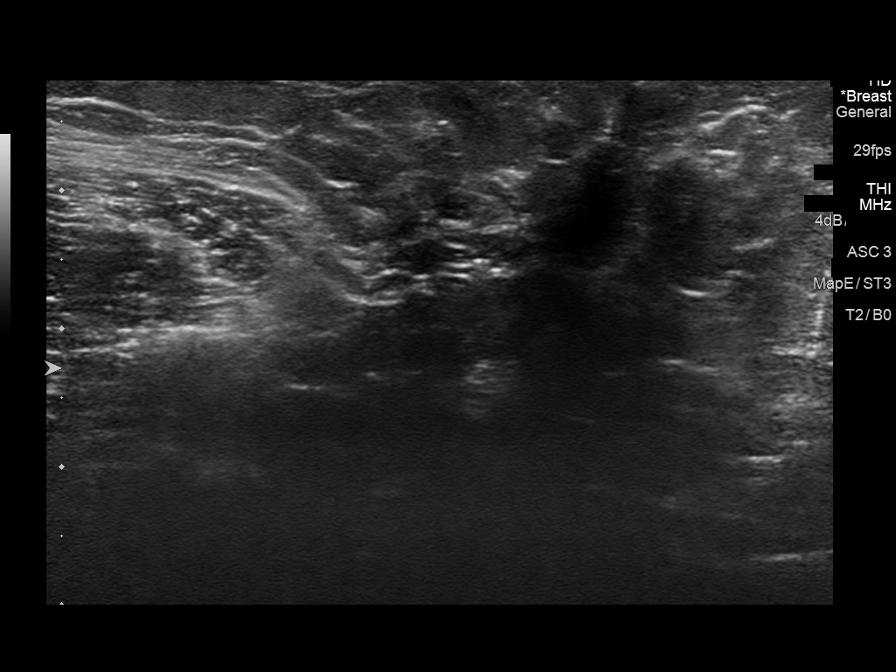

[9 of 9 positions shown; findings below may reference images not displayed]

ACR Breast Density Category c: The breast tissue is heterogeneously
dense, which may obscure small masses.
FINDINGS: There is a partially imaged mass in the superior aspect of the right
breast seen on the MLO view. No suspicious mass or malignant type
microcalcifications identified in either breast.

Mammographic images were processed with CAD.

Targeted ultrasound is performed, showing a well-circumscribed
hypoechoic mass in the right breast at 10 o'clock 10 cm from the
nipple measuring 1.4 x 0.5 x 0.9 cm. On the prior ultrasound dated
11/11/2014 it measured 1.3 x 0.5 x 0.9 cm. It is compatible with a
benign fibroadenoma.
IMPRESSION: Stable benign mass in the 10 o'clock region of the right breast
consistent with a fibroadenoma. No evidence of malignancy in either
breast.

RECOMMENDATION:
Bilateral screening mammogram in 1 year is recommended.

I have discussed the findings and recommendations with the patient.
Results were also provided in writing at the conclusion of the
visit. If applicable, a reminder letter will be sent to the patient
regarding the next appointment.

BI-RADS CATEGORY  2: Benign.

## 2019-11-02 ENCOUNTER — Other Ambulatory Visit: Payer: Self-pay

## 2019-11-02 DIAGNOSIS — Z20822 Contact with and (suspected) exposure to covid-19: Secondary | ICD-10-CM

## 2019-11-04 LAB — NOVEL CORONAVIRUS, NAA: SARS-CoV-2, NAA: NOT DETECTED

## 2019-12-30 ENCOUNTER — Ambulatory Visit: Payer: BC Managed Care – PPO | Attending: Internal Medicine

## 2019-12-30 DIAGNOSIS — Z20822 Contact with and (suspected) exposure to covid-19: Secondary | ICD-10-CM

## 2019-12-31 LAB — NOVEL CORONAVIRUS, NAA: SARS-CoV-2, NAA: NOT DETECTED

## 2022-12-16 DIAGNOSIS — Z03818 Encounter for observation for suspected exposure to other biological agents ruled out: Secondary | ICD-10-CM | POA: Diagnosis not present

## 2022-12-16 DIAGNOSIS — Z1152 Encounter for screening for COVID-19: Secondary | ICD-10-CM | POA: Diagnosis not present

## 2022-12-16 DIAGNOSIS — R051 Acute cough: Secondary | ICD-10-CM | POA: Diagnosis not present

## 2022-12-16 DIAGNOSIS — J029 Acute pharyngitis, unspecified: Secondary | ICD-10-CM | POA: Diagnosis not present

## 2023-08-15 ENCOUNTER — Ambulatory Visit (INDEPENDENT_AMBULATORY_CARE_PROVIDER_SITE_OTHER): Payer: BC Managed Care – PPO | Admitting: Family Medicine

## 2023-08-15 VITALS — BP 130/78 | HR 62 | Temp 98.6°F | Ht 72.0 in | Wt 195.0 lb

## 2023-08-15 DIAGNOSIS — Z0000 Encounter for general adult medical examination without abnormal findings: Secondary | ICD-10-CM | POA: Insufficient documentation

## 2023-08-15 DIAGNOSIS — Z1231 Encounter for screening mammogram for malignant neoplasm of breast: Secondary | ICD-10-CM | POA: Insufficient documentation

## 2023-08-15 DIAGNOSIS — Z1211 Encounter for screening for malignant neoplasm of colon: Secondary | ICD-10-CM | POA: Diagnosis not present

## 2023-08-15 DIAGNOSIS — E785 Hyperlipidemia, unspecified: Secondary | ICD-10-CM | POA: Diagnosis not present

## 2023-08-15 DIAGNOSIS — Z1159 Encounter for screening for other viral diseases: Secondary | ICD-10-CM | POA: Diagnosis not present

## 2023-08-15 DIAGNOSIS — Z780 Asymptomatic menopausal state: Secondary | ICD-10-CM | POA: Insufficient documentation

## 2023-08-15 NOTE — Assessment & Plan Note (Signed)
Previously noted; now with 30# weight gain in 6 years Repeat LP recommend diet low in saturated fat and regular exercise - 30 min at least 5 times per week

## 2023-08-15 NOTE — Assessment & Plan Note (Signed)
Recommend Vit D screening; hx of hysterectomy

## 2023-08-15 NOTE — Assessment & Plan Note (Signed)
Encouraged dental and prevention screening Things to do to keep yourself healthy  - Exercise at least 30-45 minutes a day, 3-4 days a week.  - Eat a low-fat diet with lots of fruits and vegetables, up to 7-9 servings per day.  - Seatbelts can save your life. Wear them always.  - Smoke detectors on every level of your home, check batteries every year.  - Eye Doctor - have an eye exam every 1-2 years  - Safe sex - if you may be exposed to STDs, use a condom.  - Alcohol -  If you drink, do it moderately, less than 2 drinks per day.  - Health Care Power of Attorney. Choose someone to speak for you if you are not able.  - Depression is common in our stressful world.If you're feeling down or losing interest in things you normally enjoy, please come in for a visit.  - Violence - If anyone is threatening or hurting you, please call immediately.

## 2023-08-15 NOTE — Progress Notes (Signed)
New patient visit  Patient: Angelica Dougherty   DOB: 1971-10-29   52 y.o. Female  MRN: [id number] Visit Date: 08/15/2023  Today's healthcare provider: Jacky Kindle, FNP  Patient presents for new patient visit to establish care.  Introduced to Publishing rights manager role and practice setting.  All questions answered.  Discussed provider/patient relationship and expectations.  Chief Complaint  Patient presents with   Establish Care   Subjective    Angelica Dougherty is a 52 y.o. female who presents today as a new patient to establish care.  HPI HPI   Random abdominal pain. Last time in July Last edited by Adline Peals, CMA on 08/15/2023 10:07 AM.       Past Medical History:  Diagnosis Date   Allergy    Past Surgical History:  Procedure Laterality Date   ABDOMINAL HYSTERECTOMY  06/2006   Total hysterctomy excessive bleeding and fibroids per patient report. [facility]   BREAST EXCISIONAL BIOPSY Right    benign   BREAST LUMPECTOMY Right    benign   CESAREAN SECTION  12/16/2005   Family Status  Relation Name Status   Mother  Alive   Father  Alive   Sister  Alive   Brother  Alive   Oceanographer  (Not Specified)   Emelda Brothers  (Not Specified)   Cousin paternal Alive  No partnership data on file   Family History  Problem Relation Age of Onset   Asthma Mother    Breast cancer Paternal Aunt    Breast cancer Paternal Aunt    Breast cancer Cousin    Social History   Socioeconomic History   Marital status: Single    Spouse name: Not on file   Number of children: 1   Years of education: Not on file   Highest education level: Some college, no degree  Occupational History   Not on file  Tobacco Use   Smoking status: Never   Smokeless tobacco: Never  Substance and Sexual Activity   Alcohol use: No   Drug use: No   Sexual activity: Not Currently  Other Topics Concern   Not on file  Social History Narrative   Not on file   Social Determinants of Health    Financial Resource Strain: Low Risk  (08/15/2023)   Overall Financial Resource Strain (CARDIA)    Difficulty of Paying Living Expenses: Not very hard  Food Insecurity: No Food Insecurity (08/15/2023)   Hunger Vital Sign    Worried About Running Out of Food in the Last Year: Never true    Ran Out of Food in the Last Year: Never true  Transportation Needs: No Transportation Needs (08/15/2023)   PRAPARE - Administrator, Civil Service (Medical): No    Lack of Transportation (Non-Medical): No  Physical Activity: Insufficiently Active (08/15/2023)   Exercise Vital Sign    Days of Exercise per Week: 2 days    Minutes of Exercise per Session: 20 min  Stress: No Stress Concern Present (08/15/2023)   [employer] of Occupational Health - Occupational Stress Questionnaire    Feeling of Stress : Not at all  Social Connections: Moderately Integrated (08/15/2023)   Social Connection and Isolation Panel [NHANES]    Frequency of Communication with Friends and Family: More than three times a week    Frequency of Social Gatherings with Friends and Family: Once a week    Attends Religious Services: More than 4 times per year    Active Member  of Clubs or Organizations: Yes    Attends Banker Meetings: More than 4 times per year    Marital Status: Never married   No outpatient medications prior to visit.   No facility-administered medications prior to visit.   Allergies  Allergen Reactions   Codeine     Swelling    Immunization History  Administered Date(s) Administered   Tdap 09/28/2014    Health Maintenance  Topic Date Due   Hepatitis C Screening  Never done   Colonoscopy  Never done   MAMMOGRAM  09/30/2020   Zoster Vaccines- Shingrix (1 of 2) Never done   COVID-19 Vaccine (1 - 2023-24 season) Never done   INFLUENZA VACCINE  03/08/2024 (Originally 07/10/2023)   DTaP/Tdap/Td (2 - Td or Tdap) 09/28/2024   HIV Screening  Completed   HPV VACCINES  Aged Out   PAP  SMEAR-Modifier  Discontinued    Patient Care Team: Jacky Kindle, FNP as PCP - General (Family Medicine)  Review of Systems      Objective    BP 130/78 (BP Location: Right Arm, Patient Position: Sitting, Cuff Size: Normal)   Pulse 62   Temp 98.6 F (37 C) (Oral)   Ht 6' (1.829 m)   Wt 195 lb (88.5 kg)   LMP 06/08/2006 (Approximate) Comment: Hysterectomy  SpO2 100%   BMI 26.45 kg/m   Physical Exam Vitals and nursing note reviewed.  Constitutional:      General: She is awake. She is not in acute distress.    Appearance: Normal appearance. She is well-developed, well-groomed and overweight. She is not ill-appearing, toxic-appearing or diaphoretic.  HENT:     Head: Normocephalic and atraumatic.     Jaw: There is normal jaw occlusion. No trismus, tenderness, swelling or pain on movement.     Right Ear: Hearing, tympanic membrane, ear canal and external ear normal. There is no impacted cerumen.     Left Ear: Hearing, tympanic membrane, ear canal and external ear normal. There is no impacted cerumen.     Nose: Nose normal. No congestion or rhinorrhea.     Right Turbinates: Not enlarged, swollen or pale.     Left Turbinates: Not enlarged, swollen or pale.     Right Sinus: No maxillary sinus tenderness or frontal sinus tenderness.     Left Sinus: No maxillary sinus tenderness or frontal sinus tenderness.     Mouth/Throat:     Lips: Pink.     Mouth: Mucous membranes are moist. No injury.     Tongue: No lesions.     Pharynx: Oropharynx is clear. Uvula midline. No pharyngeal swelling, oropharyngeal exudate, posterior oropharyngeal erythema or uvula swelling.     Tonsils: No tonsillar exudate or tonsillar abscesses.  Eyes:     General: Lids are normal. Lids are everted, no foreign bodies appreciated. Vision grossly intact. Gaze aligned appropriately. No allergic shiner or visual field deficit.       Right eye: No discharge.        Left eye: No discharge.     Extraocular  Movements: Extraocular movements intact.     Conjunctiva/sclera: Conjunctivae normal.     Right eye: Right conjunctiva is not injected. No exudate.    Left eye: Left conjunctiva is not injected. No exudate.    Pupils: Pupils are equal, round, and reactive to light.  Neck:     Thyroid: No thyroid mass, thyromegaly or thyroid tenderness.     Vascular: No carotid bruit.  Trachea: Trachea normal.  Cardiovascular:     Rate and Rhythm: Normal rate and regular rhythm.     Pulses: Normal pulses.          Carotid pulses are 2+ on the right side and 2+ on the left side.      Radial pulses are 2+ on the right side and 2+ on the left side.       Dorsalis pedis pulses are 2+ on the right side and 2+ on the left side.       Posterior tibial pulses are 2+ on the right side and 2+ on the left side.     Heart sounds: Normal heart sounds, S1 normal and S2 normal. No murmur heard.    No friction rub. No gallop.  Pulmonary:     Effort: Pulmonary effort is normal. No respiratory distress.     Breath sounds: Normal breath sounds and air entry. No stridor. No wheezing, rhonchi or rales.  Chest:     Chest wall: No tenderness.  Abdominal:     General: Abdomen is flat. Bowel sounds are normal. There is no distension.     Palpations: Abdomen is soft. There is no mass.     Tenderness: There is no abdominal tenderness. There is no right CVA tenderness, left CVA tenderness, guarding or rebound.     Hernia: No hernia is present.  Genitourinary:    Comments: Exam deferred; denies complaints Musculoskeletal:        General: No swelling, tenderness, deformity or signs of injury. Normal range of motion.     Cervical back: Full passive range of motion without pain, normal range of motion and neck supple. No edema, rigidity or tenderness. No muscular tenderness.     Right lower leg: No edema.     Left lower leg: No edema.  Lymphadenopathy:     Cervical: No cervical adenopathy.     Right cervical: No superficial,  deep or posterior cervical adenopathy.    Left cervical: No superficial, deep or posterior cervical adenopathy.  Skin:    General: Skin is warm and dry.     Capillary Refill: Capillary refill takes less than 2 seconds.     Coloration: Skin is not jaundiced or pale.     Findings: No bruising, erythema, lesion or rash.  Neurological:     General: No focal deficit present.     Mental Status: She is alert and oriented to person, place, and time. Mental status is at baseline.     GCS: GCS eye subscore is 4. GCS verbal subscore is 5. GCS motor subscore is 6.     Sensory: Sensation is intact. No sensory deficit.     Motor: Motor function is intact. No weakness.     Coordination: Coordination is intact. Coordination normal.     Gait: Gait is intact. Gait normal.  Psychiatric:        Attention and Perception: Attention and perception normal.        Mood and Affect: Mood and affect normal.        Speech: Speech normal.        Behavior: Behavior normal. Behavior is cooperative.        Thought Content: Thought content normal.        Cognition and Memory: Cognition and memory normal.        Judgment: Judgment normal.    Depression Screen    08/15/2023   10:08 AM 11/03/2017    9:19 AM  PHQ  2/9 Scores  PHQ - 2 Score 0 0  PHQ- 9 Score 0 0   No results found for any visits on 08/15/23.  Assessment & Plan      Problem List Items Addressed This Visit       Other   Annual physical exam - Primary    Encouraged dental and prevention screening Things to do to keep yourself healthy  - Exercise at least 30-45 minutes a day, 3-4 days a week.  - Eat a low-fat diet with lots of fruits and vegetables, up to 7-9 servings per day.  - Seatbelts can save your life. Wear them always.  - Smoke detectors on every level of your home, check batteries every year.  - Eye Doctor - have an eye exam every 1-2 years  - Safe sex - if you may be exposed to STDs, use a condom.  - Alcohol -  If you drink, do it  moderately, less than 2 drinks per day.  - Health Care Power of Attorney. Choose someone to speak for you if you are not able.  - Depression is common in our stressful world.If you're feeling down or losing interest in things you normally enjoy, please come in for a visit.  - Violence - If anyone is threatening or hurting you, please call immediately.       Relevant Orders   CBC with Differential/Platelet   TSH   Hemoglobin A1c   Comprehensive Metabolic Panel (CMET)   Lipid panel   Vitamin D (25 hydroxy)   Urinalysis, Routine w reflex microscopic   HLD (hyperlipidemia)    Previously noted; now with 30# weight gain in 6 years Repeat LP recommend diet low in saturated fat and regular exercise - 30 min at least 5 times per week       Relevant Orders   Lipid panel   Post-menopausal    Recommend Vit D screening; hx of hysterectomy       Relevant Orders   Vitamin D (25 hydroxy)   Screen for colon cancer    Occasional complaints of generalized abdominal pain and bloating Denies further changes or concerns Referral for colon cancer screening       Relevant Orders   Ambulatory referral to Gastroenterology   Screening mammogram for breast cancer    Due for screening for mammogram, denies breast concerns, provided with phone number to call and schedule appointment for mammogram. Encouraged to repeat breast cancer screening every 1-2 years.       Relevant Orders   MM 3D SCREENING MAMMOGRAM BILATERAL BREAST   Other Visit Diagnoses     Encounter for hepatitis C screening test for low risk patient       Relevant Orders   Hepatitis C Antibody      Return if symptoms worsen or fail to improve.    Leilani Merl, FNP, have reviewed all documentation for this visit. The documentation on 08/15/23 for the exam, diagnosis, procedures, and orders are all accurate and complete.  Jacky Kindle, FNP  [facility] [phone] (phone) [phone]  (fax)  [facility]

## 2023-08-15 NOTE — Patient Instructions (Signed)
The CDC recommends two doses of Shingrix (the new shingles vaccine) separated by 2 to 6 months for adults age 53 years and older. I recommend checking with your insurance plan regarding coverage for this vaccine.    Please call and schedule your mammogram:  [name]  [address], [address] [facility] [place],  [place]  [address] Get Driving Directions Main: [phone]  Sunday:Closed Monday:7:20 AM - 5:00 PM Tuesday:7:20 AM - 5:00 PM Wednesday:7:20 AM - 5:00 PM Thursday:7:20 AM - 5:00 PM Friday:7:20 AM - 4:30 PM Saturday:Closed  

## 2023-08-15 NOTE — Assessment & Plan Note (Signed)
Occasional complaints of generalized abdominal pain and bloating Denies further changes or concerns Referral for colon cancer screening

## 2023-08-15 NOTE — Assessment & Plan Note (Signed)
Due for screening for mammogram, denies breast concerns, provided with phone number to call and schedule appointment for mammogram. Encouraged to repeat breast cancer screening every 1-2 years.  

## 2023-08-16 LAB — URINALYSIS, ROUTINE W REFLEX MICROSCOPIC
Bilirubin, UA: NEGATIVE
Glucose, UA: NEGATIVE
Ketones, UA: NEGATIVE
Leukocytes,UA: NEGATIVE
Nitrite, UA: NEGATIVE
Protein,UA: NEGATIVE
Specific Gravity, UA: 1.02 (ref 1.005–1.030)
Urobilinogen, Ur: 0.2 mg/dL (ref 0.2–1.0)
pH, UA: 6 (ref 5.0–7.5)

## 2023-08-16 LAB — MICROSCOPIC EXAMINATION
Bacteria, UA: NONE SEEN
Casts: NONE SEEN /lpf

## 2023-08-16 LAB — CBC WITH DIFFERENTIAL/PLATELET
Basophils Absolute: 0 10*3/uL (ref 0.0–0.2)
Basos: 0 %
EOS (ABSOLUTE): 0.1 10*3/uL (ref 0.0–0.4)
Eos: 2 %
Hematocrit: 42 % (ref 34.0–46.6)
Hemoglobin: 14 g/dL (ref 11.1–15.9)
Immature Grans (Abs): 0 10*3/uL (ref 0.0–0.1)
Immature Granulocytes: 0 %
Lymphocytes Absolute: 2.9 10*3/uL (ref 0.7–3.1)
Lymphs: 44 %
MCH: 31.8 pg (ref 26.6–33.0)
MCHC: 33.3 g/dL (ref 31.5–35.7)
MCV: 96 fL (ref 79–97)
Monocytes Absolute: 0.4 10*3/uL (ref 0.1–0.9)
Monocytes: 6 %
Neutrophils Absolute: 3.1 10*3/uL (ref 1.4–7.0)
Neutrophils: 48 %
Platelets: 319 10*3/uL (ref 150–450)
RBC: 4.4 x10E6/uL (ref 3.77–5.28)
RDW: 12.5 % (ref 11.7–15.4)
WBC: 6.6 10*3/uL (ref 3.4–10.8)

## 2023-08-16 LAB — LIPID PANEL
Chol/HDL Ratio: 6.2 ratio — ABNORMAL HIGH (ref 0.0–4.4)
Cholesterol, Total: 283 mg/dL — ABNORMAL HIGH (ref 100–199)
HDL: 46 mg/dL (ref >39)
LDL Chol Calc (NIH): 221 mg/dL — ABNORMAL HIGH (ref 0–99)
Triglycerides: 93 mg/dL (ref 0–149)
VLDL Cholesterol Cal: 16 mg/dL (ref 5–40)

## 2023-08-16 LAB — HEPATITIS C ANTIBODY: Hep C Virus Ab: NONREACTIVE

## 2023-08-16 LAB — COMPREHENSIVE METABOLIC PANEL
ALT: 18 IU/L (ref 0–32)
AST: 22 IU/L (ref 0–40)
Albumin: 4.6 g/dL (ref 3.8–4.9)
Alkaline Phosphatase: 61 IU/L (ref 44–121)
BUN/Creatinine Ratio: 8 — ABNORMAL LOW (ref 9–23)
BUN: 8 mg/dL (ref 6–24)
Bilirubin Total: 0.6 mg/dL (ref 0.0–1.2)
CO2: 24 mmol/L (ref 20–29)
Calcium: 9.9 mg/dL (ref 8.7–10.2)
Chloride: 102 mmol/L (ref 96–106)
Creatinine, Ser: 0.99 mg/dL (ref 0.57–1.00)
Globulin, Total: 2.5 g/dL (ref 1.5–4.5)
Glucose: 92 mg/dL (ref 70–99)
Potassium: 4.6 mmol/L (ref 3.5–5.2)
Sodium: 138 mmol/L (ref 134–144)
Total Protein: 7.1 g/dL (ref 6.0–8.5)
eGFR: 69 mL/min/{1.73_m2} (ref >59)

## 2023-08-16 LAB — HEMOGLOBIN A1C
Est. average glucose Bld gHb Est-mCnc: 114 mg/dL
Hgb A1c MFr Bld: 5.6 % (ref 4.8–5.6)

## 2023-08-16 LAB — TSH: TSH: 0.683 u[IU]/mL (ref 0.450–4.500)

## 2023-08-16 LAB — VITAMIN D 25 HYDROXY (VIT D DEFICIENCY, FRACTURES): Vit D, 25-Hydroxy: 11 ng/mL — ABNORMAL LOW (ref 30.0–100.0)

## 2023-08-26 ENCOUNTER — Ambulatory Visit
Admission: RE | Admit: 2023-08-26 | Discharge: 2023-08-26 | Disposition: A | Discharge status: Home / Self Care | Payer: BC Managed Care – PPO | Source: Ambulatory Visit | Attending: Family Medicine | Admitting: Family Medicine

## 2023-08-26 DIAGNOSIS — Z1231 Encounter for screening mammogram for malignant neoplasm of breast: Secondary | ICD-10-CM | POA: Diagnosis not present

## 2023-08-27 ENCOUNTER — Other Ambulatory Visit: Payer: Self-pay | Admitting: Family Medicine

## 2023-08-27 DIAGNOSIS — R921 Mammographic calcification found on diagnostic imaging of breast: Secondary | ICD-10-CM

## 2023-08-27 DIAGNOSIS — N63 Unspecified lump in unspecified breast: Secondary | ICD-10-CM

## 2023-08-27 DIAGNOSIS — R928 Other abnormal and inconclusive findings on diagnostic imaging of breast: Secondary | ICD-10-CM

## 2023-08-27 NOTE — Progress Notes (Signed)
Incomplete mammogram; you will be contacted from radiology on the next steps.

## 2023-09-09 DIAGNOSIS — C50912 Malignant neoplasm of unspecified site of left female breast: Secondary | ICD-10-CM

## 2023-09-09 DIAGNOSIS — C50911 Malignant neoplasm of unspecified site of right female breast: Secondary | ICD-10-CM

## 2023-09-09 DIAGNOSIS — D0511 Intraductal carcinoma in situ of right breast: Secondary | ICD-10-CM

## 2023-09-09 HISTORY — DX: Malignant neoplasm of unspecified site of left female breast: C50.912

## 2023-09-09 HISTORY — DX: Malignant neoplasm of unspecified site of right female breast: C50.911

## 2023-09-09 HISTORY — DX: Intraductal carcinoma in situ of right breast: D05.11

## 2023-09-12 ENCOUNTER — Other Ambulatory Visit: Payer: BC Managed Care – PPO

## 2023-09-17 ENCOUNTER — Ambulatory Visit
Admission: RE | Admit: 2023-09-17 | Discharge: 2023-09-17 | Disposition: A | Discharge status: Home / Self Care | Payer: BC Managed Care – PPO | Source: Ambulatory Visit | Attending: Family Medicine

## 2023-09-17 ENCOUNTER — Ambulatory Visit
Admission: RE | Admit: 2023-09-17 | Discharge: 2023-09-17 | Disposition: A | Discharge status: Home / Self Care | Payer: BC Managed Care – PPO | Source: Ambulatory Visit | Attending: Family Medicine | Admitting: Family Medicine

## 2023-09-17 DIAGNOSIS — R921 Mammographic calcification found on diagnostic imaging of breast: Secondary | ICD-10-CM | POA: Diagnosis not present

## 2023-09-17 DIAGNOSIS — N63 Unspecified lump in unspecified breast: Secondary | ICD-10-CM | POA: Insufficient documentation

## 2023-09-17 DIAGNOSIS — R928 Other abnormal and inconclusive findings on diagnostic imaging of breast: Secondary | ICD-10-CM

## 2023-09-17 DIAGNOSIS — R92333 Mammographic heterogeneous density, bilateral breasts: Secondary | ICD-10-CM | POA: Diagnosis not present

## 2023-09-17 DIAGNOSIS — N6322 Unspecified lump in the left breast, upper inner quadrant: Secondary | ICD-10-CM | POA: Diagnosis not present

## 2023-09-18 ENCOUNTER — Other Ambulatory Visit: Payer: Self-pay | Admitting: Family Medicine

## 2023-09-18 DIAGNOSIS — R928 Other abnormal and inconclusive findings on diagnostic imaging of breast: Secondary | ICD-10-CM

## 2023-09-25 ENCOUNTER — Ambulatory Visit
Admission: RE | Admit: 2023-09-25 | Discharge: 2023-09-25 | Disposition: A | Discharge status: Home / Self Care | Payer: BC Managed Care – PPO | Source: Ambulatory Visit | Attending: Family Medicine | Admitting: Family Medicine

## 2023-09-25 DIAGNOSIS — D0501 Lobular carcinoma in situ of right breast: Secondary | ICD-10-CM | POA: Diagnosis not present

## 2023-09-25 DIAGNOSIS — R928 Other abnormal and inconclusive findings on diagnostic imaging of breast: Secondary | ICD-10-CM

## 2023-09-25 DIAGNOSIS — D0511 Intraductal carcinoma in situ of right breast: Secondary | ICD-10-CM | POA: Diagnosis not present

## 2023-09-25 DIAGNOSIS — C50212 Malignant neoplasm of upper-inner quadrant of left female breast: Secondary | ICD-10-CM | POA: Diagnosis not present

## 2023-09-25 HISTORY — PX: BREAST BIOPSY: SHX20

## 2023-09-25 MED ORDER — LIDOCAINE 1 % OPTIME INJ - NO CHARGE
5.0000 mL | Freq: Once | INTRAMUSCULAR | Status: AC
  Administered 2023-09-25: 5 mL
  Filled 2023-09-25: qty 6

## 2023-09-25 MED ORDER — LIDOCAINE-EPINEPHRINE 1 %-1:100000 IJ SOLN
8.0000 mL | Freq: Once | INTRAMUSCULAR | Status: AC
  Administered 2023-09-25: 8 mL
  Filled 2023-09-25: qty 8

## 2023-09-25 MED ORDER — LIDOCAINE 1 % OPTIME INJ - NO CHARGE
2.0000 mL | Freq: Once | INTRAMUSCULAR | Status: AC
  Administered 2023-09-25: 2 mL
  Filled 2023-09-25: qty 2

## 2023-09-25 MED ORDER — LIDOCAINE-EPINEPHRINE 1 %-1:100000 IJ SOLN
20.0000 mL | Freq: Once | INTRAMUSCULAR | Status: AC
  Administered 2023-09-25: 20 mL
  Filled 2023-09-25: qty 20

## 2023-09-26 LAB — SURGICAL PATHOLOGY

## 2023-09-29 ENCOUNTER — Encounter: Payer: Self-pay | Admitting: *Deleted

## 2023-09-29 DIAGNOSIS — D0511 Intraductal carcinoma in situ of right breast: Secondary | ICD-10-CM

## 2023-09-29 DIAGNOSIS — C50919 Malignant neoplasm of unspecified site of unspecified female breast: Secondary | ICD-10-CM

## 2023-09-29 NOTE — Progress Notes (Signed)
Received referral for newly diagnosed breast cancer from [facility].  Navigation initiated.  She will see Dr. Alena Bills tomorrow at 2:00.  Referral placed to [facility] per patient preference.

## 2023-09-30 ENCOUNTER — Inpatient Hospital Stay: Payer: BC Managed Care – PPO | Attending: Internal Medicine | Admitting: Internal Medicine

## 2023-09-30 ENCOUNTER — Encounter: Payer: Self-pay | Admitting: Family Medicine

## 2023-09-30 ENCOUNTER — Encounter: Payer: Self-pay | Admitting: *Deleted

## 2023-09-30 ENCOUNTER — Other Ambulatory Visit: Payer: Self-pay | Admitting: *Deleted

## 2023-09-30 ENCOUNTER — Inpatient Hospital Stay: Payer: BC Managed Care – PPO

## 2023-09-30 VITALS — BP 145/80 | HR 72 | Temp 97.8°F | Wt 190.0 lb

## 2023-09-30 DIAGNOSIS — Z885 Allergy status to narcotic agent status: Secondary | ICD-10-CM | POA: Insufficient documentation

## 2023-09-30 DIAGNOSIS — Z9189 Other specified personal risk factors, not elsewhere classified: Secondary | ICD-10-CM

## 2023-09-30 DIAGNOSIS — Z825 Family history of asthma and other chronic lower respiratory diseases: Secondary | ICD-10-CM | POA: Diagnosis not present

## 2023-09-30 DIAGNOSIS — Z170 Estrogen receptor positive status [ER+]: Secondary | ICD-10-CM | POA: Insufficient documentation

## 2023-09-30 DIAGNOSIS — C50919 Malignant neoplasm of unspecified site of unspecified female breast: Secondary | ICD-10-CM | POA: Insufficient documentation

## 2023-09-30 DIAGNOSIS — C50212 Malignant neoplasm of upper-inner quadrant of left female breast: Secondary | ICD-10-CM | POA: Insufficient documentation

## 2023-09-30 DIAGNOSIS — Z803 Family history of malignant neoplasm of breast: Secondary | ICD-10-CM | POA: Insufficient documentation

## 2023-09-30 DIAGNOSIS — Z79899 Other long term (current) drug therapy: Secondary | ICD-10-CM | POA: Diagnosis not present

## 2023-09-30 DIAGNOSIS — C50911 Malignant neoplasm of unspecified site of right female breast: Secondary | ICD-10-CM

## 2023-09-30 DIAGNOSIS — D0511 Intraductal carcinoma in situ of right breast: Secondary | ICD-10-CM

## 2023-09-30 DIAGNOSIS — Z1721 Progesterone receptor positive status: Secondary | ICD-10-CM | POA: Diagnosis not present

## 2023-09-30 DIAGNOSIS — Z90710 Acquired absence of both cervix and uterus: Secondary | ICD-10-CM | POA: Diagnosis not present

## 2023-09-30 DIAGNOSIS — D0510 Intraductal carcinoma in situ of unspecified breast: Secondary | ICD-10-CM | POA: Insufficient documentation

## 2023-09-30 DIAGNOSIS — C50912 Malignant neoplasm of unspecified site of left female breast: Secondary | ICD-10-CM | POA: Insufficient documentation

## 2023-09-30 NOTE — Progress Notes (Signed)
[facility] CONSULT NOTE  Patient Care Team: Jacky Kindle, FNP as PCP - General (Family Medicine) Michaelyn Barter, MD as Consulting Physician (Oncology)  REFERRING PROVIDER: Merita Norton   REASON FOR REFFERAL: Right breast DCIS, left breast IDC  CANCER STAGING   Cancer Staging  Breast cancer [place]) Staging form: Breast, AJCC 8th Edition - Clinical: Stage IIA (cT2, cN0, cM0, G3, ER+, PR+, HER2-) - Signed by Michaelyn Barter, MD on 09/30/2023 Histologic grading system: 3 grade system - Pathologic: No stage assigned - Unsigned   ASSESSMENT & PLAN:  Angelica Dougherty 52 y.o. 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  # Left breast IDC, stage IIa, ER/PR positive, HER2 negative -Detected on screening mammogram.  On ultrasound there is 2.1 x 1.2 x 1.2 cm irregular mass in the left breast at the 11:30 position 18 cm from the nipple.  Targeted ultrasound of the left axilla showed normal lymph node.  -s/p biopsy of left breast lesion at 11.30 18 cm from nipple showed invasive mammary carcinoma with focal lobular features, overall grade 3, ER 90% positive, PR 90% positive, HER2 negative by IHC  -Discussed with the patient, mother and nephew about the diagnosis, staging, prognosis and treatment.  She has stage IIa left breast cancer.  Goal of treatment is curative.  She has been referred to [facility].  Of chart, surgery office called her yesterday but no response.  We will share the contact information with the patient to call their office and schedule for follow-up appointment.  She will benefit from upfront surgery.  We will send the tumor for Oncotype DX testing to assess role for adjuvant chemotherapy.  If she decides to proceed with partial mastectomy, she will benefit from radiation.  She has history of hysterectomy around age 44.  Considering  her age she is likely postmenopausal.  I will obtain estradiol, LH FSH level to confirm.  I also discussed with her about aromatase inhibitor for 5 years such as letrozole.  Side effects such as mood changes, myalgia, arthralgia, hyperlipidemia, osteoporosis, role of DEXA scan was discussed.  Considering there are lobular features on the pathology, I will also obtain MRI breast with and without contrast to assess the extent of the disease.  I will follow-up with her in 2 to 3 weeks after the surgery.  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  1.9 cm area of calcifications. -As above.  # Family history of breast cancer -Discussed about referral to genetics.  She is okay to proceed if covered by insurance.  Will reach out to genetics.   Orders Placed This Encounter  Procedures   MR BREAST BILATERAL W WO CONTRAST INC CAD    Standing Status:   Future    Standing Expiration Date:   09/29/2024    Order Specific Question:   If indicated for the ordered procedure, I authorize the administration of contrast media per Radiology protocol    Answer:   Yes    Order Specific Question:   What is the patient's sedation requirement?    Answer:   No Sedation    Order Specific Question:   Does the patient have a pacemaker or implanted devices?    Answer:   No    Order Specific Question:   Preferred imaging location?    Answer:   [facility] (table limit - 550lbs)  [facility]    Standing Status:   Future    Number of Occurrences:   1    Standing Expiration Date:   09/29/2024   FSH/LH    Standing Status:   Future    Number of Occurrences:   1    Standing Expiration Date:   09/29/2024    The total time spent in the appointment was 60 minutes encounter with patients including review of chart and various tests results, discussions about plan of care and coordination of care plan   All questions were answered. The patient knows to call the clinic with any problems, questions or concerns.  No barriers to learning was detected.  Michaelyn Barter, MD 10/22/20243:35 PM   HISTORY OF PRESENTING ILLNESS:  Angelica Dougherty 52 y.o. 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  Detected on screening mammogram.  Patient has been feeling well.  Denies any concerns.  I have reviewed her chart and materials related to her cancer extensively and collaborated history with the patient. Summary of oncologic history is as follows: Oncology History  Breast cancer (HCC)  08/26/2023 Mammogram   Screening mammogram IMPRESSION: Further evaluation is suggested for possible calcifications in the right breast.   Further evaluation is suggested for possible mass in the left breast.  Diagnostic mammogram and ultrasound FINDINGS: RIGHT breast: In the upper central right breast at middle depth, there are amorphous and round calcifications in a linear distribution spanning 1.9 cm. These are new compared to most recent mammogram 12/31/2017. No suspicious mass or other findings in the right breast.   LEFT breast: The previously noted possible mass seen in the upper posterior left breast on MLO view only, overlying the pectoralis muscle, persists on additional views as a 1.8 cm irregular mass with indistinct margins in the associated architectural distortion. On tomosynthesis series, the mass localizes to the medial breast. No definite mammographic correlate seen on [facility] M view, likely due to the far posterior location. No suspicious calcifications or other suspicious findings in the left breast.   Targeted ultrasound of the left breast at the 11:30 position 18 cm from the nipple demonstrates a 2.1 x 1.2 x 1.2 cm irregular hypoechoic mass with indistinct and angular margins, corresponding to the mammographic finding.   Targeted ultrasound of the left axilla demonstrates lymph nodes with normal morphology.    IMPRESSION: 1. Highly suspicious 2.1 cm mass at the left breast 11:30 position. 2. Suspicious linear calcifications in the upper central right breast. 3. No left axillary lymphadenopathy.     09/25/2023 Pathology Results   FINAL DIAGNOSIS       1. Breast, right, needle core biopsy, upper middle depth, ribbon clip :      - DUCTAL CARCINOMA IN SITU, INTERMEDIATE GRADE (2)      - CANNOT RULE OUT FOCAL MICROINVASION      - NECROSIS: PRESENT, COMEDO-TYPE      - CALCIFICATIONS: PRESENT      - DCIS LENGTH: 0.22 CM      - SEE NOTE       2. 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      - TUBULE FORMATION: SCORE 3      - NUCLEAR PLEOMORPHISM: SCORE 3      - MITOTIC COUNT: SCORE 2      - TOTAL SCORE:  8      - OVERALL GRADE: 3      - LYMPHOVASCULAR INVASION: NOT IDENTIFIED      - CANCER LENGTH: 1.1 CM      - CALCIFICATIONS: NOT IDENTIFIED      - SEE NOTE  Results: IMMUNOHISTOCHEMICAL AND MORPHOMETRIC ANALYSIS PERFORMED MANUALLY Estrogen Receptor:  95%, POSITIVE, STRONG STAINING INTENSITY REFERENCE RANGE ESTROGEN RECEPTOR NEGATIVE     0% POSITIVE       =>1% All controls stained appropriately [employer], [place], Sports administrator, International aid/development worker ( Signed 10 21 2024) Breast, left, needle core biopsy, 11:30 o'clock, 18cmfn savi scout PROGNOSTIC INDICATORS  Results: IMMUNOHISTOCHEMICAL AND MORPHOMETRIC ANALYSIS PERFORMED MANUALLY The tumor cells are negative for Her2 (1+). Estrogen Receptor:  90%, POSITIVE, STRONG STAINING INTENSITY Progesterone Receptor:  90%, POSITIVE, STRONG STAINING INTENSITY REFERENCE RANGE ESTROGEN RECEPTOR NEGATIVE     0% POSITIVE       =>1% REFERENCE RANGE PROGESTERONE RECEPTOR NEGATIVE     0% POSITIVE        =>1%    09/30/2023 Cancer Staging   Staging form: Breast, AJCC 8th Edition - Clinical: Stage IIA (cT2, cN0, cM0, G3, ER+, PR+, HER2-) - Signed by Michaelyn Barter, MD on 09/30/2023 Histologic grading system: 3 grade system     MEDICAL HISTORY:  Past Medical History:  Diagnosis Date   Allergy     SURGICAL HISTORY: Past Surgical History:  Procedure Laterality Date   ABDOMINAL HYSTERECTOMY  06/2006   Total hysterctomy excessive bleeding and fibroids per patient report. [place]   BREAST BIOPSY Right 09/25/2023   stereo bx, calcs, RIBBON clip-path pending   BREAST BIOPSY Left 09/25/2023   [place] bx, savi tag as marker, path pending   BREAST BIOPSY Right 09/25/2023   MM RT BREAST BX W LOC DEV 1ST LESION IMAGE BX SPEC STEREO GUIDE 09/25/2023 [facility]   BREAST BIOPSY Left 09/25/2023   [place] LT BREAST BX W LOC DEV 1ST LESION IMG BX SPEC US GUIDE 09/25/2023 [facility]   BREAST EXCISIONAL BIOPSY Right    benign   BREAST LUMPECTOMY Right    benign   CESAREAN SECTION  12/16/2005    SOCIAL HISTORY: Social History   Socioeconomic History   Marital status: Single    Spouse name: Not on file   Number of children: 1   Years of education: Not on file   Highest education level: Some college, no degree  Occupational History   Not on file  Tobacco Use   Smoking status: Never   Smokeless tobacco: Never  Substance and Sexual Activity   Alcohol use: No   Drug use: No   Sexual activity: Not Currently  Other Topics Concern   Not on file  Social History Narrative   Not on file   Social Determinants of Health   Financial Resource Strain: Low Risk  (08/15/2023)   Overall Financial Resource Strain (CARDIA)    Difficulty of Paying Living Expenses: Not very hard  Food Insecurity: No Food Insecurity (08/15/2023)   Hunger Vital Sign    Worried About Running Out of Food in the Last Year: Never true    Ran Out of Food in the Last Year: Never true  Transportation Needs: No Transportation Needs (08/15/2023)   PRAPARE - Administrator, Civil Service (Medical): No    Lack of Transportation (Non-Medical): No  Physical  Activity: Insufficiently Active (08/15/2023)   Exercise Vital Sign    Days of Exercise per Week: 2 days  Minutes of Exercise per Session: 20 min  Stress: No Stress Concern Present (08/15/2023)   [employer] of Occupational Health - Occupational Stress Questionnaire    Feeling of Stress : Not at all  Social Connections: Moderately Integrated (08/15/2023)   Social Connection and Isolation Panel [NHANES]    Frequency of Communication with Friends and Family: More than three times a week    Frequency of Social Gatherings with Friends and Family: Once a week    Attends Religious Services: More than 4 times per year    Active Member of [employer] or Organizations: Yes    Attends Engineer, structural: More than 4 times per year    Marital Status: Never married  Catering manager Violence: Not on file    FAMILY HISTORY: Family History  Problem Relation Age of Onset   Asthma Mother    Breast cancer Paternal Aunt    Breast cancer Paternal Aunt    Breast cancer Cousin     ALLERGIES:  is allergic to codeine.  MEDICATIONS:  No current outpatient medications on file.   No current facility-administered medications for this visit.    REVIEW OF SYSTEMS:   Pertinent information mentioned in HPI All other systems were reviewed with the patient and are negative.  PHYSICAL EXAMINATION: ECOG PERFORMANCE STATUS: 0 - Asymptomatic  Vitals:   09/30/23 1409  BP: (!) 145/80  Pulse: 72  Temp: 97.8 F (36.6 C)  SpO2: 100%   Filed Weights   09/30/23 1409  Weight: 190 lb (86.2 kg)    GENERAL:alert, no distress and comfortable SKIN: skin color, texture, turgor are normal, no rashes or significant lesions EYES: normal, conjunctiva are pink and non-injected, sclera clear OROPHARYNX:no exudate, no erythema and lips, buccal mucosa, and tongue normal  NECK: supple, thyroid normal size, non-tender, without nodularity LYMPH:  no palpable lymphadenopathy in the cervical, axillary or  inguinal LUNGS: clear to auscultation and percussion with normal breathing effort HEART: regular rate & rhythm and no murmurs and no lower extremity edema ABDOMEN:abdomen soft, non-tender and normal bowel sounds Musculoskeletal:no cyanosis of digits and no clubbing  PSYCH: alert & oriented x 3 with fluent speech NEURO: no focal motor/sensory deficits  LABORATORY DATA:  I have reviewed the data as listed Lab Results  Component Value Date   WBC 6.6 08/15/2023   HGB 14.0 08/15/2023   HCT 42.0 08/15/2023   MCV 96 08/15/2023   PLT 319 08/15/2023   Recent Labs    08/15/23 1030  NA 138  K 4.6  CL 102  CO2 24  GLUCOSE 92  BUN 8  CREATININE 0.99  CALCIUM 9.9  PROT 7.1  ALBUMIN 4.6  AST 22  ALT 18  ALKPHOS 61  BILITOT 0.6    RADIOGRAPHIC STUDIES: I have personally reviewed the radiological images as listed and agreed with the findings in the report. MM RT BREAST BX W LOC DEV 1ST LESION IMAGE BX SPEC STEREO GUIDE  Addendum Date: 09/26/2023   ADDENDUM REPORT: 09/26/2023 15:23 ADDENDUM: PATHOLOGY revealed: Site 1. Breast, right, needle core biopsy, upper middle depth, ribbon clip. - DUCTAL CARCINOMA IN SITU, INTERMEDIATE GRADE (2). - CANNOT RULE OUT FOCAL MICROINVASION. - NECROSIS: PRESENT, COMEDO-TYPE - CALCIFICATIONS: PRESENT. - DCIS LENGTH: 0.22 CM. Pathology results are CONCORDANT with imaging findings, per Dr. Meda Klinefelter. PATHOLOGY revealed: Site 2. Breast, left, needle core biopsy, 11:30 o'clock, 18 cmfn, savi scout. - INVASIVE MAMMARY CARCINOMA WITH FOCAL LOBULAR FEATURES - TUBULE FORMATION: SCORE 3. - OVERALL GRADE: 3. -  LYMPHOVASCULAR INVASION: NOT IDENTIFIED. - CANCER LENGTH: 1.1 CM - CALCIFICATIONS: NOT IDENTIFIED. Pathology results are CONCORDANT with imaging findings, per Dr. Meda Klinefelter. Pathology results and recommendations below were discussed with patient by telephone on 09/26/2023. Patient reported biopsy site within normal limits with slight tenderness at  the site. Post biopsy care instructions were reviewed, questions were answered and my direct phone number was provided to patient. Patient was instructed to call [facility] if any concerns or questions arise related to the biopsy. RECOMMENDATIONS: 1. Surgical and oncological consultation. Request for surgical and oncological consultation relayed to Irving Shows RN at [facility] by Randa Lynn RN on 09/26/2023. 2. Recommend pretreatment bilateral breast MRI with and without contrast to evaluate extent of breast disease given lobular features and breast density. 3. If breast conservation surgery is being pursued, recommend complete bracketed excision of residual calcifications for Site 1 only. Pathology results reported by Randa Lynn RN on 09/26/2023. Electronically Signed   By: Meda Klinefelter M.D.   On: 09/26/2023 15:23   Result Date: 09/26/2023 CLINICAL DATA:  Highly suspicious LEFT breast mass. Indeterminate RIGHT breast calcifications. EXAM: RIGHT BREAST STEREOTACTIC CORE NEEDLE BIOPSY ULTRASOUND GUIDED LEFT BREAST CORE NEEDLE BIOPSY COMPARISON:  Previous exam(s). PROCEDURE: Site 1: RIGHT upper breast middle depth calcifications The patient and I discussed the procedure of stereotactic-guided biopsy including benefits and alternatives. We discussed the high likelihood of a successful procedure. We discussed the risks of the procedure including infection, bleeding, tissue injury, clip migration, and inadequate sampling. Informed written consent was given. The usual time out protocol was performed immediately prior to the procedure. Using sterile technique and 1% lidocaine and 1% lidocaine with epinephrine as local anesthetic, under stereotactic guidance, a 9 gauge vacuum assisted device was used to perform core needle biopsy of calcifications in the upper aspect of the RIGHT breast using a superior approach. Specimen radiograph was performed showing representative calcifications.  Specimens with calcifications are identified for pathology. Lesion quadrant: Upper outer quadrant At the conclusion of the procedure, a RIBBON shaped tissue marker clip was deployed into the biopsy cavity. Follow-up 2-view mammogram was performed and dictated separately. Site 2: LEFT breast 11:30 18 cm from the nipple I met with the patient and we discussed the procedure of ultrasound-guided biopsy, including benefits and alternatives. We discussed the high likelihood of a successful procedure. We discussed the risks of the procedure, including infection, bleeding, tissue injury, clip migration, and inadequate sampling. Informed written consent was given. The usual time-out protocol was performed immediately prior to the procedure. Lesion quadrant: Upper inner quadrant Using sterile technique and 1% lidocaine and 1% lidocaine with epinephrine as local anesthetic, under direct ultrasound visualization, a 14 gauge spring-loaded device was used to perform biopsy of a mass at 11:30 18 cm from the nipple using a medial approach. At the conclusion of the procedure a SAVI scout was deployed into the biopsy cavity. Follow up 2 view mammogram was performed and dictated separately. IMPRESSION: 1. Stereotactic guided biopsy of indeterminate RIGHT breast calcifications. No apparent significant complications. Residual calcifications extend posteriorly. 2. Ultrasound guided biopsy of a LEFT breast mass at 11:30. No apparent complications. Electronically Signed: By: Meda Klinefelter M.D. On: 09/25/2023 09:31   [place] LT BREAST BX W LOC DEV 1ST LESION IMG BX SPEC US GUIDE  Addendum Date: 09/26/2023   ADDENDUM REPORT: 09/26/2023 15:23 ADDENDUM: PATHOLOGY revealed: Site 1. Breast, right, needle core biopsy, upper middle depth, ribbon clip. - DUCTAL CARCINOMA IN SITU, INTERMEDIATE  GRADE (2). - CANNOT RULE OUT FOCAL MICROINVASION. - NECROSIS: PRESENT, COMEDO-TYPE - CALCIFICATIONS: PRESENT. - DCIS LENGTH: 0.22 CM. Pathology results  are CONCORDANT with imaging findings, per Dr. Meda Klinefelter. PATHOLOGY revealed: Site 2. Breast, left, needle core biopsy, 11:30 o'clock, 18 cmfn, savi scout. - INVASIVE MAMMARY CARCINOMA WITH FOCAL LOBULAR FEATURES - TUBULE FORMATION: SCORE 3. - OVERALL GRADE: 3. - LYMPHOVASCULAR INVASION: NOT IDENTIFIED. - CANCER LENGTH: 1.1 CM - CALCIFICATIONS: NOT IDENTIFIED. Pathology results are CONCORDANT with imaging findings, per Dr. Meda Klinefelter. Pathology results and recommendations below were discussed with patient by telephone on 09/26/2023. Patient reported biopsy site within normal limits with slight tenderness at the site. Post biopsy care instructions were reviewed, questions were answered and my direct phone number was provided to patient. Patient was instructed to call [facility] if any concerns or questions arise related to the biopsy. RECOMMENDATIONS: 1. Surgical and oncological consultation. Request for surgical and oncological consultation relayed to Irving Shows RN at [facility] by Randa Lynn RN on 09/26/2023. 2. Recommend pretreatment bilateral breast MRI with and without contrast to evaluate extent of breast disease given lobular features and breast density. 3. If breast conservation surgery is being pursued, recommend complete bracketed excision of residual calcifications for Site 1 only. Pathology results reported by Randa Lynn RN on 09/26/2023. Electronically Signed   By: Meda Klinefelter M.D.   On: 09/26/2023 15:23   Result Date: 09/26/2023 CLINICAL DATA:  Highly suspicious LEFT breast mass. Indeterminate RIGHT breast calcifications. EXAM: RIGHT BREAST STEREOTACTIC CORE NEEDLE BIOPSY ULTRASOUND GUIDED LEFT BREAST CORE NEEDLE BIOPSY COMPARISON:  Previous exam(s). PROCEDURE: Site 1: RIGHT upper breast middle depth calcifications The patient and I discussed the procedure of stereotactic-guided biopsy including benefits and alternatives. We discussed the high  likelihood of a successful procedure. We discussed the risks of the procedure including infection, bleeding, tissue injury, clip migration, and inadequate sampling. Informed written consent was given. The usual time out protocol was performed immediately prior to the procedure. Using sterile technique and 1% lidocaine and 1% lidocaine with epinephrine as local anesthetic, under stereotactic guidance, a 9 gauge vacuum assisted device was used to perform core needle biopsy of calcifications in the upper aspect of the RIGHT breast using a superior approach. Specimen radiograph was performed showing representative calcifications. Specimens with calcifications are identified for pathology. Lesion quadrant: Upper outer quadrant At the conclusion of the procedure, a RIBBON shaped tissue marker clip was deployed into the biopsy cavity. Follow-up 2-view mammogram was performed and dictated separately. Site 2: LEFT breast 11:30 18 cm from the nipple I met with the patient and we discussed the procedure of ultrasound-guided biopsy, including benefits and alternatives. We discussed the high likelihood of a successful procedure. We discussed the risks of the procedure, including infection, bleeding, tissue injury, clip migration, and inadequate sampling. Informed written consent was given. The usual time-out protocol was performed immediately prior to the procedure. Lesion quadrant: Upper inner quadrant Using sterile technique and 1% lidocaine and 1% lidocaine with epinephrine as local anesthetic, under direct ultrasound visualization, a 14 gauge spring-loaded device was used to perform biopsy of a mass at 11:30 18 cm from the nipple using a medial approach. At the conclusion of the procedure a SAVI scout was deployed into the biopsy cavity. Follow up 2 view mammogram was performed and dictated separately. IMPRESSION: 1. Stereotactic guided biopsy of indeterminate RIGHT breast calcifications. No apparent significant  complications. Residual calcifications extend posteriorly. 2. Ultrasound guided biopsy of a  LEFT breast mass at 11:30. No apparent complications. Electronically Signed: By: Meda Klinefelter M.D. On: 09/25/2023 09:31   MM CLIP PLACEMENT LEFT  Result Date: 09/25/2023 CLINICAL DATA:  Status post ultrasound-guided biopsy EXAM: 3D DIAGNOSTIC LEFT MAMMOGRAM POST ULTRASOUND BIOPSY COMPARISON:  Previous exam(s). FINDINGS: 3D Mammographic images were obtained following ultrasound guided biopsy of a LEFT breast mass at 11:30. The SAVI scout is in expected position at the site of biopsy. Mass is outside of the field of view on CC imaging IMPRESSION: Appropriate positioning of the SAVI scout at the site of biopsy in the far upper breast. Final Assessment: Post Procedure Mammograms for Marker Placement Electronically Signed   By: Meda Klinefelter M.D.   On: 09/25/2023 09:07   MM CLIP PLACEMENT RIGHT  Result Date: 09/25/2023 CLINICAL DATA:  Status post stereotactic guided biopsy EXAM: 3D DIAGNOSTIC RIGHT MAMMOGRAM POST STEREOTACTIC BIOPSY COMPARISON:  Previous exam(s). FINDINGS: 3D Mammographic images were obtained following stereotactic guided biopsy of indeterminate calcifications. The RIBBON biopsy marking clip is minimally displaced inferiorly from site of biopsy. Residual calcifications extend posteriorly. IMPRESSION: Minimal inferior displacement of the RIBBON shaped biopsy marking clip from the site of biopsy in the upper breast at middle depth. Residual calcifications extend posteriorly. Final Assessment: Post Procedure Mammograms for Marker Placement Electronically Signed   By: Meda Klinefelter M.D.   On: 09/25/2023 08:24   MM 3D DIAGNOSTIC MAMMOGRAM BILATERAL BREAST  Result Date: 09/17/2023 CLINICAL DATA:  Recall for possible mass in the left breast and calcifications in the right breast. EXAM: DIGITAL DIAGNOSTIC BILATERAL MAMMOGRAM WITH TOMOSYNTHESIS AND CAD; ULTRASOUND LEFT BREAST LIMITED  TECHNIQUE: Bilateral digital diagnostic mammography and breast tomosynthesis was performed. The images were evaluated with computer-aided detection. ; Targeted ultrasound examination of the left breast was performed. COMPARISON:  Previous exam(s). ACR Breast Density Category c: The breasts are heterogeneously dense, which may obscure small masses. FINDINGS: RIGHT breast: In the upper central right breast at middle depth, there are amorphous and round calcifications in a linear distribution spanning 1.9 cm. These are new compared to most recent mammogram 12/31/2017. No suspicious mass or other findings in the right breast. LEFT breast: The previously noted possible mass seen in the upper posterior left breast on MLO view only, overlying the pectoralis muscle, persists on additional views as a 1.8 cm irregular mass with indistinct margins in the associated architectural distortion. On tomosynthesis series, the mass localizes to the medial breast. No definite mammographic correlate seen on [facility] M view, likely due to the far posterior location. No suspicious calcifications or other suspicious findings in the left breast. Targeted ultrasound of the left breast at the 11:30 position 18 cm from the nipple demonstrates a 2.1 x 1.2 x 1.2 cm irregular hypoechoic mass with indistinct and angular margins, corresponding to the mammographic finding. Targeted ultrasound of the left axilla demonstrates lymph nodes with normal morphology. IMPRESSION: 1. Highly suspicious 2.1 cm mass at the left breast 11:30 position. 2. Suspicious linear calcifications in the upper central right breast. 3. No left axillary lymphadenopathy. RECOMMENDATION: Left breast ultrasound-guided biopsy and right breast stereotactic guided biopsy. I have discussed the findings and recommendations with the patient. The biopsy procedures were discussed with the patient and questions were answered. Patient expressed their understanding of the biopsy  recommendations. Ordering provider will be notified regarding imaging findings and recommendations. Once orders for the biopsy procedures are received, patient will be contacted to schedule the procedures at her earliest convenience. BI-RADS CATEGORY  5: Highly suggestive of  malignancy. Electronically Signed   By: Sherron Ales M.D.   On: 09/17/2023 16:14   [place] LIMITED ULTRASOUND INCLUDING AXILLA LEFT BREAST   Result Date: 09/17/2023 CLINICAL DATA:  Recall for possible mass in the left breast and calcifications in the right breast. EXAM: DIGITAL DIAGNOSTIC BILATERAL MAMMOGRAM WITH TOMOSYNTHESIS AND CAD; ULTRASOUND LEFT BREAST LIMITED TECHNIQUE: Bilateral digital diagnostic mammography and breast tomosynthesis was performed. The images were evaluated with computer-aided detection. ; Targeted ultrasound examination of the left breast was performed. COMPARISON:  Previous exam(s). ACR Breast Density Category c: The breasts are heterogeneously dense, which may obscure small masses. FINDINGS: RIGHT breast: In the upper central right breast at middle depth, there are amorphous and round calcifications in a linear distribution spanning 1.9 cm. These are new compared to most recent mammogram 12/31/2017. No suspicious mass or other findings in the right breast. LEFT breast: The previously noted possible mass seen in the upper posterior left breast on MLO view only, overlying the pectoralis muscle, persists on additional views as a 1.8 cm irregular mass with indistinct margins in the associated architectural distortion. On tomosynthesis series, the mass localizes to the medial breast. No definite mammographic correlate seen on [facility] M view, likely due to the far posterior location. No suspicious calcifications or other suspicious findings in the left breast. Targeted ultrasound of the left breast at the 11:30 position 18 cm from the nipple demonstrates a 2.1 x 1.2 x 1.2 cm irregular hypoechoic mass with indistinct and angular  margins, corresponding to the mammographic finding. Targeted ultrasound of the left axilla demonstrates lymph nodes with normal morphology. IMPRESSION: 1. Highly suspicious 2.1 cm mass at the left breast 11:30 position. 2. Suspicious linear calcifications in the upper central right breast. 3. No left axillary lymphadenopathy. RECOMMENDATION: Left breast ultrasound-guided biopsy and right breast stereotactic guided biopsy. I have discussed the findings and recommendations with the patient. The biopsy procedures were discussed with the patient and questions were answered. Patient expressed their understanding of the biopsy recommendations. Ordering provider will be notified regarding imaging findings and recommendations. Once orders for the biopsy procedures are received, patient will be contacted to schedule the procedures at her earliest convenience. BI-RADS CATEGORY  5: Highly suggestive of malignancy. Electronically Signed   By: Sherron Ales M.D.   On: 09/17/2023 16:14

## 2023-09-30 NOTE — Progress Notes (Signed)
Accompanied patient and family to initial medical oncology appointment.   Reviewed Breast Cancer treatment handbook.   Care plan summary given to patient.   Reviewed outreach programs and cancer center services.   

## 2023-10-01 LAB — FSH/LH
FSH: 11.8 m[IU]/mL
LH: 5.6 m[IU]/mL

## 2023-10-01 LAB — ESTRADIOL: Estradiol: 58.8 pg/mL

## 2023-10-02 ENCOUNTER — Encounter: Payer: Self-pay | Admitting: General Surgery

## 2023-10-02 ENCOUNTER — Ambulatory Visit (INDEPENDENT_AMBULATORY_CARE_PROVIDER_SITE_OTHER): Payer: BC Managed Care – PPO | Admitting: General Surgery

## 2023-10-02 VITALS — BP 148/75 | HR 75 | Temp 98.3°F | Ht 73.0 in | Wt 189.2 lb

## 2023-10-02 DIAGNOSIS — C50212 Malignant neoplasm of upper-inner quadrant of left female breast: Secondary | ICD-10-CM | POA: Diagnosis not present

## 2023-10-02 DIAGNOSIS — D0511 Intraductal carcinoma in situ of right breast: Secondary | ICD-10-CM | POA: Diagnosis not present

## 2023-10-02 NOTE — Patient Instructions (Signed)
We have spoken today about removing a lump in your breast. This will be done by Dr. Maurine Minister at [facility].  You will most likely be able to leave the hospital several hours after your surgery. Rarely, a patient needs to stay over night but this is a possibility.  Plan to tentatively be off work for 1-2 weeks following the surgery and may return with approximately 2 more weeks of a lifting restriction, no greater than 15 lbs.  Please see your Blue surgery sheet for more information. Our surgery scheduler will call you to look at surgery dates and to go over information.   If you have FMLA or Disability paperwork that needs to be filled out, please have your company fax your paperwork to [phone] or you may drop this by either office. This paperwork will be filled out within 3 days after your surgery has been completed.    Lumpectomy A lumpectomy is a form of "breast conserving" or "breast preservation" surgery. It may also be referred to as a partial mastectomy. During a lumpectomy, the portion of the breast that contains the cancerous tumor or breast mass (the lump) is removed. Some normal tissue around the lump may also be removed to make sure all of the tumor has been removed.  LET [facility] CARE PROVIDER KNOW ABOUT: Any allergies you have. All medicines you are taking, including vitamins, herbs, eye drops, creams, and over-the-counter medicines. Previous problems you or members of your family have had with the use of anesthetics. Any blood disorders you have. Previous surgeries you have had. Medical conditions you have. RISKS AND COMPLICATIONS Generally, this is a safe procedure. However, problems can occur and include: Bleeding. Infection. Pain. Temporary swelling. Change in the shape of the breast, particularly if a large portion is removed. BEFORE THE PROCEDURE Ask your health care provider about changing or stopping your regular medicines. This is especially important if you are  taking diabetes medicines or blood thinners. Do not eat or drink anything after midnight on the night before the procedure or as directed by your health care provider. Ask your health care provider if you can take a sip of water with any approved medicines. On the day of surgery, your health care provider will use a mammogram or ultrasound to locate and mark the tumor in your breast. These markings on your breast will show where the cut (incision) will be made.   PROCEDURE  An IV tube will be put into one of your veins. You may be given medicine to help you relax before the surgery (sedative). You will be given one of the following: A medicine that numbs the area (local anesthetic). A medicine that makes you fall asleep (general anesthetic). Your health care provider will use a kind of electric scalpel that uses heat to minimize bleeding (electrocautery knife). A curved incision (like a smile or frown) that follows the natural curve of your breast is made, to allow for minimal scarring and better healing. The tumor will be removed with some of the surrounding tissue. This will be sent to the lab for analysis. Your health care provider may also remove your lymph nodes at this time if needed. Sometimes, but not always, a rubber tube called a drain will be surgically inserted into your breast area or armpit to collect excess fluid that may accumulate in the space where the tumor was. This drain is connected to a plastic bulb on the outside of your body. This drain creates suction to  help remove the fluid. The incisions will be closed with stitches (sutures). A bandage may be placed over the incisions. AFTER THE PROCEDURE You will be taken to the recovery area. You will be given medicine for pain. A small rubber drain may be placed in the breast for 2-3 days to prevent a collection of blood (hematoma) from developing in the breast. You will be given instructions on caring for the drain before you go  home. A pressure bandage (dressing) will be applied for 1-2 days to prevent bleeding. Ask your health care provider how to care for your bandage at home.   This information is not intended to replace advice given to you by your health care provider. Make sure you discuss any questions you have with your health care provider.   Document Released: 01/06/2007 Document Revised: 12/16/2014 Document Reviewed: 04/30/2013 Elsevier Interactive Patient Education [employer]

## 2023-10-03 ENCOUNTER — Other Ambulatory Visit: Payer: Self-pay | Admitting: General Surgery

## 2023-10-03 DIAGNOSIS — D0511 Intraductal carcinoma in situ of right breast: Secondary | ICD-10-CM

## 2023-10-03 DIAGNOSIS — C50819 Malignant neoplasm of overlapping sites of unspecified female breast: Secondary | ICD-10-CM

## 2023-10-06 ENCOUNTER — Encounter: Payer: Self-pay | Admitting: *Deleted

## 2023-10-06 DIAGNOSIS — D0511 Intraductal carcinoma in situ of right breast: Secondary | ICD-10-CM

## 2023-10-06 DIAGNOSIS — Z170 Estrogen receptor positive status [ER+]: Secondary | ICD-10-CM

## 2023-10-06 NOTE — Progress Notes (Unsigned)
Bil lumpectomies scheduled for 11/11.   She will follow up with Dr. Alena Bills and Dr. Rushie Chestnut on 12/2.  Appt. Details given to her.

## 2023-10-07 ENCOUNTER — Ambulatory Visit: Payer: Self-pay | Admitting: General Surgery

## 2023-10-07 ENCOUNTER — Encounter: Payer: Self-pay | Admitting: Surgery

## 2023-10-07 ENCOUNTER — Telehealth: Payer: Self-pay | Admitting: General Surgery

## 2023-10-07 DIAGNOSIS — Z170 Estrogen receptor positive status [ER+]: Secondary | ICD-10-CM

## 2023-10-07 NOTE — Progress Notes (Signed)
Patient ID: Angelica Dougherty, female   DOB: 1971/02/19, 51 y.o.   MRN: [id number]  History of Present Illness Angelica Dougherty is a 52 y.o. female presents to clinic today after a new cancer diagnosis.  The patient had a screening mammography in September that showed concern in the left breast as well as some calcifications in the right breast.  She underwent diagnostic mammography that showed continued concern for lesions and a BI-RADS 5.  Subsequently she underwent a left breast biopsy as well as a right breast biopsy.  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  The patient reports that she did not have any symptoms.  She did not feel any masses or noticed any overlying skin changes.  She has not had any nipple discharge.  She has a history of birth control use but has had a hysterectomy.  She has a significant family history of 2 aunts and 1 cousin with breast cancer.  She initially had her menses at age 1 and is G1, P1.  She was 28 at the age of her first pregnancy and did not breast-feed.  She has an MRI scheduled for 30 October.  Her oncologic history is summed up below.  Oncology History  Breast cancer (HCC)  08/26/2023 Mammogram   Screening mammogram IMPRESSION: Further evaluation is suggested for possible calcifications in the right breast.   Further evaluation is suggested for possible mass in the left breast.  Diagnostic mammogram and ultrasound FINDINGS: RIGHT breast: In the upper central right breast at middle depth, there are amorphous and round calcifications in a linear distribution spanning 1.9 cm. These are new compared to most recent mammogram 12/31/2017. No suspicious mass or other findings in the right breast.   LEFT breast: The previously noted possible mass seen in the upper posterior left breast on MLO view only, overlying the pectoralis muscle, persists on additional views as a 1.8 cm  irregular mass with indistinct margins in the associated architectural distortion. On tomosynthesis series, the mass localizes to the medial breast. No definite mammographic correlate seen on [facility] M view, likely due to the far posterior location. No suspicious calcifications or other suspicious findings in the left breast.   Targeted ultrasound of the left breast at the 11:30 position 18 cm from the nipple demonstrates a 2.1 x 1.2 x 1.2 cm irregular hypoechoic mass with indistinct and angular margins, corresponding to the mammographic finding.   Targeted ultrasound of the left axilla demonstrates lymph nodes with normal morphology.   IMPRESSION: 1. Highly suspicious 2.1 cm mass at the left breast 11:30 position. 2. Suspicious linear calcifications in the upper central right breast. 3. No left axillary lymphadenopathy.     09/25/2023 Pathology Results   FINAL DIAGNOSIS       1. Breast, right, needle core biopsy, upper middle depth, ribbon clip :      - DUCTAL CARCINOMA IN SITU, INTERMEDIATE GRADE (2)      - CANNOT RULE OUT FOCAL MICROINVASION      - NECROSIS: PRESENT, COMEDO-TYPE      - CALCIFICATIONS: PRESENT      - DCIS LENGTH: 0.22 CM      - SEE NOTE       2. 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      - TUBULE FORMATION: SCORE 3      -  NUCLEAR PLEOMORPHISM: SCORE 3      - MITOTIC COUNT: SCORE 2      - TOTAL SCORE: 8      - OVERALL GRADE: 3      - LYMPHOVASCULAR INVASION: NOT IDENTIFIED      - CANCER LENGTH: 1.1 CM      - CALCIFICATIONS: NOT IDENTIFIED      - SEE NOTE  Results: IMMUNOHISTOCHEMICAL AND MORPHOMETRIC ANALYSIS PERFORMED MANUALLY Estrogen Receptor:  95%, POSITIVE, STRONG STAINING INTENSITY REFERENCE RANGE ESTROGEN RECEPTOR NEGATIVE     0% POSITIVE       =>1% All controls stained appropriately [employer] [employer], [place], Sports administrator, International aid/development worker ( Signed 10 21  2024) Breast, left, needle core biopsy, 11:30 o'clock, 18cmfn savi scout PROGNOSTIC INDICATORS  Results: IMMUNOHISTOCHEMICAL AND MORPHOMETRIC ANALYSIS PERFORMED MANUALLY The tumor cells are negative for Her2 (1+). Estrogen Receptor:  90%, POSITIVE, STRONG STAINING INTENSITY Progesterone Receptor:  90%, POSITIVE, STRONG STAINING INTENSITY REFERENCE RANGE ESTROGEN RECEPTOR NEGATIVE     0% POSITIVE       =>1% REFERENCE RANGE PROGESTERONE RECEPTOR NEGATIVE     0% POSITIVE        =>1%    09/30/2023 Cancer Staging   Staging form: Breast, AJCC 8th Edition - Clinical: Stage IIA (cT2, cN0, cM0, G3, ER+, PR+, HER2-) - Signed by Michaelyn Barter, MD on 09/30/2023 Histologic grading system: 3 grade system    .  Past Medical History Past Medical History:  Diagnosis Date   Allergy        Past Surgical History:  Procedure Laterality Date   ABDOMINAL HYSTERECTOMY  06/2006   Total hysterctomy excessive bleeding and fibroids per patient report. [facility]   BREAST BIOPSY Right 09/25/2023   stereo bx, calcs, RIBBON clip-path pending   BREAST BIOPSY Left 09/25/2023   [place] bx, savi tag as marker, path pending   BREAST BIOPSY Right 09/25/2023   MM RT BREAST BX W LOC DEV 1ST LESION IMAGE BX SPEC STEREO GUIDE 09/25/2023 [facility]   BREAST BIOPSY Left 09/25/2023   [place] LT BREAST BX W LOC DEV 1ST LESION IMG BX SPEC US GUIDE 09/25/2023 [facility]   BREAST EXCISIONAL BIOPSY Right    benign   BREAST LUMPECTOMY Right    benign   CESAREAN SECTION  12/16/2005    Allergies  Allergen Reactions   Codeine Other (See Comments)    Swelling    Current Outpatient Medications  Medication Sig Dispense Refill   naproxen sodium (ALEVE) 220 MG tablet Take 440 mg by mouth 2 (two) times daily as needed (severe pain.).     No current facility-administered medications for this visit.    Family History Family History  Problem Relation Age of Onset   Asthma Mother    Breast cancer Paternal  Aunt    Breast cancer Paternal Aunt    Breast cancer Cousin        Social History Social History   Tobacco Use   Smoking status: Never   Smokeless tobacco: Never  Substance Use Topics   Alcohol use: No   Drug use: No        ROS Full ROS of systems performed and is otherwise negative there than what is stated in the HPI  Physical Exam Blood pressure (!) 148/75, pulse 75, temperature 98.3 F (36.8 C), temperature source Oral, height 6\' 1"  (1.854 m), weight 189 lb 3.2 oz (85.8 kg), last menstrual period 06/08/2006, SpO2 96%.  Acute distress, PERRLA, moving all extremities spontaneously, alert and oriented x  3, clear to auscultation bilaterally without any rubs murmurs or gallops on cardiac exam.  Breast exam performed in the presence of a chaperone.  On her right breast she has no axillary lymphadenopathy.  There is a site where the biopsy was performed but there are no other skin changes.  She has no nipple retraction.  There are no dominant masses palpated.  The left breast there is no axillary lymphadenopathy.  Again she has areas where she has slight bruising from her biopsy site.  Superior to the nipple and just medial there is a nonmobile palpable mass that measures approximately 2 cm.  Data Reviewed I have independently reviewed her mammograms and her ultrasound findings.  She has on her right breast an area of calcifications that has been biopsied and this is consistent with DCIS.  She does have a ribbon biopsy at this location.  On her left breast imaging the area of concern is quite close to the chest wall and has a Savi scout placed.  I have personally reviewed the patient's imaging and medical records.    Assessment    The patient is a 52 year old who had screening mammography that showed concerning findings in her right and left breast.  On her right breast she was noted to have calcifications that were biopsied and positive for DCIS.  On her left breast she had a  mass superior medial to the nipple along the chest wall that was biopsied and positive for invasive mammary carcinoma.  Plan    The patient and I had a long discussion about each one of her breast lesions.  For her left previous lesion I discussed with her that she does have cancer and that the role of treatment at this stage is for curative treatment with surgical therapy.  I discussed with her that she is a candidate for breast conserving therapy with lumpectomy.  I also discussed with her that we would need to stage her axilla so she would require a sentinel lymph node biopsy.  If she did have breast conserving therapy she would need to undergo postoperative radiation as well as hormonal therapy if applicable.  Alternatively, I could perform a mastectomy but she would still need to have a sentinel lymph node biopsy.  I discussed the risk of lumpectomy includes the risk of infection, bleeding, positive margins and need for reoperation, seroma formation, cosmetic distortion.  I also discussed with her that the risk are similar with a full mastectomy.  I then addressed her right breast lesion.  I discussed with her that this is a premalignant lesion and the recommendations are that there is excision of this lesion.  We talked about that she could undergo either a lumpectomy or mastectomy.  She understands that if she undergoes lumpectomy then she would likely need postoperative radiation therapy and applicable hormonal therapy.  If she undergoes mastectomy then I would perform a sentinel lymph node biopsy.  I also discussed with her that the risks of the lumpectomy and the mastectomy on the right side are similar to the risk of the left side.  The patient would like to preserve her breast tissue and is opting for bilateral lumpectomies with a left sentinel lymph node biopsy.  On her left breast lesion there is already a [place] scout placed.  We will refer her to clear medicine for localization of the sentinel  lymph node.  We will also have a Savi scout placed in her right breast at the area corresponding to  the DCIS.  I will follow-up her MRI and contact her should this change any of our plans    Kandis Cocking 10/07/2023, 1:00 PM

## 2023-10-07 NOTE — Telephone Encounter (Signed)
Patient has been advised of Pre-Admission date/time, and Surgery date at [facility].  Surgery Date: 10/20/23 Preadmission Testing Date: 10/13/23 (phone 8a-1p)  Patient has been made aware to arrive @ 7:30 am @ [facility] for SLN injection to be done before her surgery.     Patient also reminded of RF tag placement at the [facility] on 10/15/23.

## 2023-10-08 ENCOUNTER — Encounter: Payer: Self-pay | Admitting: Licensed Clinical Social Worker

## 2023-10-08 ENCOUNTER — Inpatient Hospital Stay: Payer: BC Managed Care – PPO

## 2023-10-08 ENCOUNTER — Telehealth: Payer: Self-pay | Admitting: Family Medicine

## 2023-10-08 ENCOUNTER — Ambulatory Visit
Admission: RE | Admit: 2023-10-08 | Discharge: 2023-10-08 | Disposition: A | Discharge status: Home / Self Care | Payer: BC Managed Care – PPO | Source: Ambulatory Visit | Attending: Internal Medicine | Admitting: Internal Medicine

## 2023-10-08 ENCOUNTER — Inpatient Hospital Stay: Payer: BC Managed Care – PPO | Admitting: Licensed Clinical Social Worker

## 2023-10-08 ENCOUNTER — Ambulatory Visit: Payer: BC Managed Care – PPO | Attending: Internal Medicine | Admitting: Occupational Therapy

## 2023-10-08 ENCOUNTER — Encounter: Payer: Self-pay | Admitting: Occupational Therapy

## 2023-10-08 DIAGNOSIS — C50911 Malignant neoplasm of unspecified site of right female breast: Secondary | ICD-10-CM | POA: Insufficient documentation

## 2023-10-08 DIAGNOSIS — Z170 Estrogen receptor positive status [ER+]: Secondary | ICD-10-CM | POA: Insufficient documentation

## 2023-10-08 DIAGNOSIS — Z803 Family history of malignant neoplasm of breast: Secondary | ICD-10-CM

## 2023-10-08 DIAGNOSIS — R293 Abnormal posture: Secondary | ICD-10-CM | POA: Insufficient documentation

## 2023-10-08 DIAGNOSIS — R928 Other abnormal and inconclusive findings on diagnostic imaging of breast: Secondary | ICD-10-CM | POA: Diagnosis not present

## 2023-10-08 MED ORDER — GADOBUTROL 1 MMOL/ML IV SOLN
7.5000 mL | Freq: Once | INTRAVENOUS | Status: AC | PRN
  Administered 2023-10-08: 7.5 mL via INTRAVENOUS

## 2023-10-08 NOTE — Progress Notes (Signed)
REFERRING PROVIDER: Michaelyn Barter, MD [address] [place],  [place] [address]  PRIMARY PROVIDER:  Jacky Kindle, FNP  PRIMARY REASON FOR VISIT:  1. Malignant neoplasm of right breast in female, estrogen receptor positive, unspecified site of breast (HCC)   2. Family history of breast cancer      HISTORY OF PRESENT ILLNESS:   Angelica Dougherty, a 52 y.o. female, was seen for a [place] cancer genetics consultation at the request of Dr. Alena Bills due to a personal and family history of breast cancer.  Angelica Dougherty presents to clinic today to discuss the possibility of a hereditary predisposition to cancer, genetic testing, and to further clarify her future cancer risks, as well as potential cancer risks for family members.   CANCER HISTORY:  Oncology History  Breast cancer (HCC)  08/26/2023 Mammogram   Screening mammogram IMPRESSION: Further evaluation is suggested for possible calcifications in the right breast.   Further evaluation is suggested for possible mass in the left breast.  Diagnostic mammogram and ultrasound FINDINGS: RIGHT breast: In the upper central right breast at middle depth, there are amorphous and round calcifications in a linear distribution spanning 1.9 cm. These are new compared to most recent mammogram 12/31/2017. No suspicious mass or other findings in the right breast.   LEFT breast: The previously noted possible mass seen in the upper posterior left breast on MLO view only, overlying the pectoralis muscle, persists on additional views as a 1.8 cm irregular mass with indistinct margins in the associated architectural distortion. On tomosynthesis series, the mass localizes to the medial breast. No definite mammographic correlate seen on [facility] M view, likely due to the far posterior location. No suspicious calcifications or other suspicious findings in the left breast.   Targeted ultrasound of the left breast at the 11:30 position 18 cm from the  nipple demonstrates a 2.1 x 1.2 x 1.2 cm irregular hypoechoic mass with indistinct and angular margins, corresponding to the mammographic finding.   Targeted ultrasound of the left axilla demonstrates lymph nodes with normal morphology.   IMPRESSION: 1. Highly suspicious 2.1 cm mass at the left breast 11:30 position. 2. Suspicious linear calcifications in the upper central right breast. 3. No left axillary lymphadenopathy.     09/25/2023 Pathology Results   FINAL DIAGNOSIS       1. Breast, right, needle core biopsy, upper middle depth, ribbon clip :      - DUCTAL CARCINOMA IN SITU, INTERMEDIATE GRADE (2)      - CANNOT RULE OUT FOCAL MICROINVASION      - NECROSIS: PRESENT, COMEDO-TYPE      - CALCIFICATIONS: PRESENT      - DCIS LENGTH: 0.22 CM      - SEE NOTE       2. 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      - TUBULE FORMATION: SCORE 3      - NUCLEAR PLEOMORPHISM: SCORE 3      - MITOTIC COUNT: SCORE 2      - TOTAL SCORE: 8      - OVERALL GRADE: 3      - LYMPHOVASCULAR INVASION: NOT IDENTIFIED      - CANCER LENGTH: 1.1 CM      - CALCIFICATIONS: NOT IDENTIFIED      - SEE NOTE  Results: IMMUNOHISTOCHEMICAL AND MORPHOMETRIC ANALYSIS PERFORMED MANUALLY Estrogen Receptor:  95%, POSITIVE,  STRONG STAINING INTENSITY REFERENCE RANGE ESTROGEN RECEPTOR NEGATIVE     0% POSITIVE       =>1% All controls stained appropriately [employer], [place], Sports administrator, International aid/development worker ( Signed [phone] 2024) Breast, left, needle core biopsy, 11:30 o'clock, 18cmfn savi scout PROGNOSTIC INDICATORS  Results: IMMUNOHISTOCHEMICAL AND MORPHOMETRIC ANALYSIS PERFORMED MANUALLY The tumor cells are negative for Her2 (1+). 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 The treatment plan includes lumpectomy, scheduled for 11/11.  RISK FACTORS:  Menarche was at age 35.  First live birth at age 61.  Ovaries intact: no.  Hysterectomy: yes.  Colonoscopy: no; not examined.  Past Medical History:  Diagnosis Date   Allergy     Past Surgical History:  Procedure Laterality Date   ABDOMINAL HYSTERECTOMY  06/2006   Total hysterctomy excessive bleeding and fibroids per patient report. [facility]   BREAST BIOPSY Right 09/25/2023   stereo bx, calcs, RIBBON clip-path pending   BREAST BIOPSY Left 09/25/2023   [place] bx, savi tag as marker, path pending   BREAST BIOPSY Right 09/25/2023   MM RT BREAST BX W LOC DEV 1ST LESION IMAGE BX SPEC STEREO GUIDE 09/25/2023 [facility]   BREAST BIOPSY Left 09/25/2023   [place] LT BREAST BX W LOC DEV 1ST LESION IMG BX SPEC US GUIDE 09/25/2023 [facility]   BREAST EXCISIONAL BIOPSY Right    benign   BREAST LUMPECTOMY Right    benign   CESAREAN SECTION  12/16/2005    FAMILY HISTORY:  We obtained a detailed, 4-generation family history.  Significant diagnoses are listed below: Family History  Problem Relation Age of Onset   Asthma Mother    Cancer Maternal Uncle        back cancer at 22   Breast cancer Paternal Aunt        dx 30s, bilateral   Breast cancer Paternal Aunt        dx 76s   Breast cancer Cousin        dx 69s    Angelica Dougherty has 1 daughter, 34. She has 1 brother, 1 sister, no cancers.   Angelica Dougherty mother is living at 42. A maternal uncle had back cancer at 78 and is living  at 60.   Angelica Dougherty's father is living at 79. Two paternal aunts had breast cancer, one in her 30s (bilateral) and one in her 55s. A paternal cousin had breast cancer in her 71s and is living at 40, unknown if she has had genetic testing.  Ms. Mullineaux is unaware of previous family history of genetic testing for hereditary cancer risks. There is no reported Ashkenazi Jewish ancestry. There isn o known consanguinity.    GENETIC COUNSELING ASSESSMENT: Ms. Sirna is a 52 y.o. female with a personal and family history of breast cancer which is somewhat suggestive of a hereditary cancer syndrome and predisposition  to cancer. We, therefore, discussed and recommended the following at today's visit.   DISCUSSION: We discussed that approximately 10% of breast cancer is hereditary. Most cases of hereditary breast cancer are associated with BRCA1/BRCA2 genes, although there are other genes associated with hereditary cancer as well. Cancers and risks are gene specific. We discussed that testing is beneficial for several reasons including knowing about cancer risks, identifying potential screening and risk-reduction options that may be appropriate, and to understand if other family members could be at risk for cancer and allow them to undergo genetic testing.   We reviewed the characteristics, features and inheritance patterns of hereditary cancer syndromes. We also discussed genetic testing, including the appropriate family members to test, the process of testing, insurance coverage and turn-around-time for results. We discussed the implications of a negative, positive and/or variant of uncertain significant result. We recommended Ms. Klare pursue genetic testing for the [facility] BRCAPlus+ CancerNext-Expanded+RNA gene panel.   Based on Ms. Jetter's personal and family history of cancer, she meets medical criteria for genetic testing. Despite that she meets criteria, she may still have an out of  pocket cost.   PLAN: After considering the risks, benefits, and limitations, Ms. Handler provided informed consent to pursue genetic testing and the blood sample was sent to [facility] for analysis of the CancerNext-Expanded+RNA panel. Results should be available within approximately 1 weeks' time, at which point they will be disclosed by telephone to Ms. Nofsinger, as will any additional recommendations warranted by these results. Ms. Gonthier will receive a summary of her genetic counseling visit and a copy of her results once available. This information will also be available in Epic.   Ms. Mcginity questions were answered to her satisfaction today. Our contact information was provided should additional questions or concerns arise. Thank you for the referral and allowing [place] to share in the care of your patient.   Lacy Duverney, MS, [facility] Genetic Counselor [email or web address] Phone: [phone]  The patient was seen for a total of 20 minutes in face-to-face genetic counseling.  Dr. Blake Divine was available for discussion regarding this case.   _______________________________________________________________________ For Office Staff:  Number of people involved in session: 1 Was an Intern/ student involved with case: no

## 2023-10-08 NOTE — Therapy (Addendum)
OUTPATIENT OCCUPATIONAL THERAPY BREAST CANCER BASELINE EVALUATION   Patient Name: Angelica Dougherty MRN: [id number] DOB:10/31/1971, 52 y.o., female Today's Date: 10/08/2023  END OF SESSION:  OT End of Session - 10/08/23 1650     Visit Number 1    Number of Visits 2    Date for OT Re-Evaluation 11/19/23    OT Start Time 1301    OT Stop Time 1328    OT Time Calculation (min) 27 min    Activity Tolerance Patient tolerated treatment well    Behavior During Therapy [facility] for tasks assessed/performed             Past Medical History:  Diagnosis Date   Allergy    Past Surgical History:  Procedure Laterality Date   ABDOMINAL HYSTERECTOMY  06/2006   Total hysterctomy excessive bleeding and fibroids per patient report. [facility]   BREAST BIOPSY Right 09/25/2023   stereo bx, calcs, RIBBON clip-path pending   BREAST BIOPSY Left 09/25/2023   [place] bx, savi tag as marker, path pending   BREAST BIOPSY Right 09/25/2023   MM RT BREAST BX W LOC DEV 1ST LESION IMAGE BX SPEC STEREO GUIDE 09/25/2023 [facility]   BREAST BIOPSY Left 09/25/2023   [place] LT BREAST BX W LOC DEV 1ST LESION IMG BX SPEC US GUIDE 09/25/2023 [facility]   BREAST EXCISIONAL BIOPSY Right    benign   BREAST LUMPECTOMY Right    benign   CESAREAN SECTION  12/16/2005   Patient Active Problem List   Diagnosis Date Noted   Breast cancer (HCC) 09/30/2023   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   Annual physical exam 08/15/2023   Screening mammogram for breast cancer 08/15/2023   Screen for colon cancer 08/15/2023   Post-menopausal 08/15/2023   HLD (hyperlipidemia) 08/03/2007    PCP: Suzie Portela NP  REFERRING PROVIDER: DR Geryl Councilman DIAG: Breast CA with bil lumpectomy  THERAPY DIAG:  Abnormal posture  Rationale for Evaluation and Treatment: Rehabilitation  ONSET DATE: 09/30/23  SUBJECTIVE:                                                                                                                                                                                            SUBJECTIVE STATEMENT: Patient reports she is here today after being refer by one of her medical team for her newly diagnosed bilateral breast cancer.   PERTINENT HISTORY:  Patient was diagnosed with bilateral  breast cancer - plan is to have bilateral lumpectomy with ln remove on L .   PATIENT GOALS:   reduce lymphedema risk and learn post op HEP.   PAIN:  Are  you having pain? No  PRECAUTIONS: Active CA     HAND DOMINANCE: left  WEIGHT BEARING RESTRICTIONS: No  FALLS:  Has patient fallen in last 6 months? 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   LEISURE: read , spend time with daughter    OBJECTIVE:  COGNITION: Overall cognitive status: Within functional limits for tasks assessed    POSTURE:  Forward head and rounded shoulders posture  UPPER EXTREMITY AROM/PROM:  A/PROM RIGHT   eval   Shoulder extension   Shoulder flexion 165  Shoulder abduction 165  Shoulder internal rotation   Shoulder external rotation 85    (Blank rows = not tested)  A/PROM LEFT   eval  Shoulder extension   Shoulder flexion 165  Shoulder abduction 165  Shoulder internal rotation   Shoulder external rotation 85    (Blank rows = not tested)  CERVICAL AROM: All within normal limits:     UPPER EXTREMITY STRENGTH: 5/5 for bilateral shoulder in all planes   LYMPHEDEMA ASSESSMENTS:   LANDMARK RIGHT   eval  10 cm proximal to olecranon process   Olecranon process   10 cm proximal to ulnar styloid process   Just proximal to ulnar styloid process   Across hand at thumb web space   At base of 2nd digit   (Blank rows = not tested)  LANDMARK LEFT   eval  10 cm proximal to olecranon process   Olecranon process   10 cm proximal to ulnar styloid process   Just proximal to ulnar styloid process    Across hand at thumb web space   At base of 2nd digit   (Blank rows = not tested)  L-DEX LYMPHEDEMA SCREENING:  The patient was assessed using the L-Dex machine today to produce a lymphedema index baseline score. The patient will be reassessed on a regular basis (typically every 3 months) to obtain new L-Dex scores. If the score is > 6.5 points away from his/her baseline score indicating onset of subclinical lymphedema, it will be recommended to wear a compression garment for 4 weeks, 12 hours per day and then be reassessed. If the score continues to be > 6.5 points from baseline at reassessment, we will initiate lymphedema treatment. Assessing in this manner has a 95% rate of preventing clinically significant lymphedema.  5.6 score - L UE at risk  R UE dominant - no lymph nodes remove L-Dex score at baseline   PATIENT EDUCATION:  Education details: Lymphedema risk reduction and post op shoulder/posture HEP Person educated: Patient Education method: Explanation, Demonstration, Handout Education comprehension: Patient verbalized understanding and returned demonstration  HOME EXERCISE PROGRAM: Patient was instructed today in a home exercise program today for post op shoulder range of motion. These included active assist shoulder flexion in sitting/supine, scapular retraction, wall walking/slides with shoulder abduction, and hands behind head external rotation in sitting /supine.  She was encouraged to do these twice a day, holding 3 seconds and repeating 5 times when permitted by her physician/surgeon.   ASSESSMENT:  CLINICAL IMPRESSION: Her multidisciplinary medical team has met to assess and determine a recommended treatment plan. She is planning to have bilateral lumpectomy with ln remove on L. She will benefit from a post op OT reassessment to determine needs and from L-Dex screens every 3 months for 2 years to detect subclinical lymphedema.  Pt will benefit from skilled therapeutic  intervention to improve on  the following deficits: Decreased knowledge of precautions and lymphedema education, impaired UE functional use, pain, decreased ROM, postural dysfunction.   OT treatment/interventions: ADL/self-care home management, pt/family education, therapeutic exercise,manual therapy  REHAB POTENTIAL: Good  CLINICAL DECISION MAKING: Stable/uncomplicated  EVALUATION COMPLEXITY: Low   GOALS: Goals reviewed with patient? YES  LONG TERM GOALS: (STG=LTG)    Name Target Date Goal status  1 Pt will be able to verbalize understanding of pertinent lymphedema risk reduction practices relevant to her dx specifically related to skin care.  Baseline:  No knowledge 6 wks 6 wks   2 Pt will be able to return demo and/or verbalize understanding of the post op HEP related to regaining shoulder ROM. Baseline:  No knowledge MET Achieved at eval       4 Pt will demo she has regained full shoulder ROM and function post operatively compared to baselines.  Baseline: See objective measurements taken today. 6wks Initial    PLAN:  OT FREQUENCY/DURATION: EVAL and 1 follow up appointment.   PLAN FOR NEXT SESSION: will reassess 3-4 weeks post op to determine needs.   Patient will follow up at outpatient cancer rehab 3-4 weeks following surgery.  If the patient requires occupational therapy at that time, a specific plan will be dictated and sent to the referring physician for approval.  Occupational Therapy Information for After Breast Cancer Surgery/Treatment:  Lymphedema is a swelling condition that you may be at risk for in your arm if you have lymph nodes removed from the armpit area.  After a sentinel node biopsy, the risk is approximately 5-9% and is higher after an axillary node dissection.  There is treatment available for this condition and it is not life-threatening.  Contact your physician or occupational therapist with concerns. You may begin the 4 shoulder/posture exercises (see  additional sheet) when permitted by your physician (typically a week after surgery).  If you have drains, you may need to wait until those are removed before beginning range of motion exercises.  A general recommendation is to not lift your arms above shoulder height until drains are removed.  These exercises should be done to your tolerance and gently.  This is not a "no pain/no gain" type of recovery so listen to your body and stretch into the range of motion that you can tolerate, stopping if you have pain.  If you are having immediate reconstruction, ask your plastic surgeon about doing exercises as he or she may want you to wait. We encourage you to watch the ABC (After Breast Cancer)  video. You will learn information related to lymphedema risk, prevention and treatment and additional exercises to regain mobility following surgery.  While undergoing any medical procedure or treatment, try to avoid blood pressure being taken or needle sticks from occurring on the arm on the side of cancer.   This recommendation begins after surgery and continues for the rest of your life.  This may help reduce your risk of getting lymphedema (swelling in your arm). An excellent resource for those seeking information on lymphedema is the National Lymphedema Network's web site. It can be accessed at www.lymphnet.org If you notice swelling in your hand, arm or breast at any time following surgery (even if it is many years from now), please contact your doctor or occupational therapist to discuss this.  Lymphedema can be treated at any time but it is easier for you if it is treated early on.  If you feel like your shoulder motion is not  returning to normal in a reasonable amount of time, please contact your surgeon or occupational therapist.  [facility] [phone]. [address] [place], [place] [address]   Patient was instructed today in a home exercise program today for post op shoulder  range of motion. These included active assist shoulder flexion in sitting/supine, scapular retraction, wall slides/walking with shoulder abduction, and hands behind head external rotation.  She was encouraged to do these twice a day, holding 3 seconds and repeating 5 times when permitted by her physician.    Oletta Cohn, OTR/L,CLT 10/08/2023, 4:51 PM

## 2023-10-08 NOTE — Telephone Encounter (Signed)
Sending FMLA forms back for completion.

## 2023-10-09 ENCOUNTER — Other Ambulatory Visit: Payer: Self-pay | Admitting: Family Medicine

## 2023-10-09 ENCOUNTER — Telehealth: Payer: Self-pay | Admitting: Family Medicine

## 2023-10-09 DIAGNOSIS — Z0279 Encounter for issue of other medical certificate: Secondary | ICD-10-CM

## 2023-10-09 NOTE — Telephone Encounter (Signed)
Informed patient that FMLA paperwork was completed and faxed. Patient will pick up a copy up front. Patient declined office visit at this time. But if provider insist she will schedule an appt, per patient.

## 2023-10-10 ENCOUNTER — Encounter: Payer: Self-pay | Admitting: *Deleted

## 2023-10-10 ENCOUNTER — Other Ambulatory Visit: Payer: Self-pay | Admitting: Internal Medicine

## 2023-10-10 DIAGNOSIS — D0511 Intraductal carcinoma in situ of right breast: Secondary | ICD-10-CM

## 2023-10-10 DIAGNOSIS — Z170 Estrogen receptor positive status [ER+]: Secondary | ICD-10-CM

## 2023-10-10 NOTE — Progress Notes (Signed)
Orders placed for MRI guided biopsies.

## 2023-10-13 ENCOUNTER — Encounter
Admission: RE | Admit: 2023-10-13 | Discharge: 2023-10-13 | Disposition: A | Discharge status: Home / Self Care | Payer: BC Managed Care – PPO | Source: Ambulatory Visit | Attending: General Surgery | Admitting: General Surgery

## 2023-10-13 ENCOUNTER — Encounter: Payer: Self-pay | Admitting: *Deleted

## 2023-10-13 ENCOUNTER — Other Ambulatory Visit: Payer: Self-pay

## 2023-10-13 VITALS — Ht 74.0 in | Wt 189.0 lb

## 2023-10-13 DIAGNOSIS — E785 Hyperlipidemia, unspecified: Secondary | ICD-10-CM

## 2023-10-13 NOTE — Progress Notes (Signed)
Angelica Dougherty asked if I would get her MRI biopsy scheduled for her, I called but they would not schedule it with me and said she would need to call back to schedule.   I let her know and she is going to try and call this morning to schedule.

## 2023-10-13 NOTE — Patient Instructions (Signed)
Your procedure is scheduled on: Monday 10/20/23 (if this changes call the weekday before to get your arrival time) To find out your arrival time, please call [phone] between 1PM - 3PM on:  Friday 10/17/23  Report to the Registration Desk on the 1st floor of the [facility]. Free Valet parking is available.  If your arrival time is 6:00 am, do not arrive before that time as the [facility] entrance doors do not open until 6:00 am.  REMEMBER: Instructions that are not followed completely may result in serious medical risk, up to and including death; or upon the discretion of your surgeon and anesthesiologist your surgery may need to be rescheduled.  Do not eat food or drink any liquids after midnight the night before surgery.  No gum chewing or hard candies.  One week prior to surgery: Stop Anti-inflammatories (NSAIDS) such as Advil, Aleve, Ibuprofen, Motrin, Naproxen, Naprosyn and Aspirin based products such as Excedrin, Goody's Powder, BC Powder. You may however, continue to take Tylenol if needed for pain up until the day of surgery.  Stop ANY OVER THE COUNTER supplements or vitamins until after surgery.  Continue taking all prescribed medications. If there are any changes to your current medication list please contact our office at [phone]  TAKE ONLY THESE MEDICATIONS THE MORNING OF SURGERY WITH A SIP OF WATER:  none  No Alcohol for 24 hours before or after surgery.  No Smoking including e-cigarettes for 24 hours before surgery.  No chewable tobacco products for at least 6 hours before surgery.  No nicotine patches on the day of surgery.  Do not use any "recreational" drugs for at least a week (preferably 2 weeks) before your surgery.  Please be advised that the combination of cocaine and anesthesia may have negative outcomes, up to and including death. If you test positive for cocaine, your surgery will be cancelled.  On the morning of surgery brush your teeth with  toothpaste and water, you may rinse your mouth with mouthwash if you wish. Do not swallow any toothpaste or mouthwash.  Use CHG Soap or wipes as directed on instruction sheet. To be picked up at our office at [address]  Do not wear lotions, powders, or perfumes. No deodorant  Do not shave body hair from the neck down 48 hours before surgery.  Wear comfortable clothing (specific to your surgery type) to the hospital.  Do not wear jewelry, make-up, hairpins, clips or nail polish.  For welded (permanent) jewelry: bracelets, anklets, waist bands, etc.  Please have this removed prior to surgery.  If it is not removed, there is a chance that hospital personnel will need to cut it off on the day of surgery. Contact lenses, hearing aids and dentures may not be worn into surgery.  Do not bring valuables to the hospital. [facility] is not responsible for any missing/lost belongings or valuables.   Notify your doctor if there is any change in your medical condition (cold, fever, infection).  If you are being discharged the day of surgery, you will not be allowed to drive home. You will need a responsible individual to drive you home and stay with you for 24 hours after surgery.   If you are taking public transportation, you will need to have a responsible individual with you.  If you are being admitted to the hospital overnight, leave your suitcase in the car. After surgery it may be brought to your room.  In case of increased  patient census, it may be necessary for you, the patient, to continue your postoperative care in the Same Day Surgery department.  After surgery, you can help prevent lung complications by doing breathing exercises.  Take deep breaths and cough every 1-2 hours. Your doctor may order a device called an Incentive Spirometer to help you take deep breaths. When coughing or sneezing, hold a pillow firmly against your incision with both hands. This is called  "splinting." Doing this helps protect your incision. It also decreases belly discomfort.  Surgery Visitation Policy:  Patients undergoing a surgery or procedure may have two family members or support persons with them as long as the person is not COVID-19 positive or experiencing its symptoms.   Inpatient Visitation:    Visiting hours are 7 a.m. to 8 p.m. Up to four visitors are allowed at one time in a patient room. The visitors may rotate out with other people during the day. One designated support person (adult) may remain overnight.  Please call the Pre-admissions Testing Dept. at [phone] if you have any questions about these instructions.     Preparing for Surgery with CHLORHEXIDINE GLUCONATE (CHG) Soap  Chlorhexidine Gluconate (CHG) Soap  o An antiseptic cleaner that kills germs and bonds with the skin to continue killing germs even after washing  o Used for showering the night before surgery and morning of surgery  Before surgery, you can play an important role by reducing the number of germs on your skin.  CHG (Chlorhexidine gluconate) soap is an antiseptic cleanser which kills germs and bonds with the skin to continue killing germs even after washing.  Please do not use if you have an allergy to CHG or antibacterial soaps. If your skin becomes reddened/irritated stop using the CHG.  1. Shower the NIGHT BEFORE SURGERY and the MORNING OF SURGERY with CHG soap.  2. If you choose to wash your hair, wash your hair first as usual with your normal shampoo.  3. After shampooing, rinse your hair and body thoroughly to remove the shampoo.  4. Use CHG as you would any other liquid soap. You can apply CHG directly to the skin and wash gently with a scrungie or a clean washcloth.  5. Apply the CHG soap to your body only from the neck down. Do not use on open wounds or open sores. Avoid contact with your eyes, ears, mouth, and genitals (private parts). Wash face and genitals  (private parts) with your normal soap.  6. Wash thoroughly, paying special attention to the area where your surgery will be performed.  7. Thoroughly rinse your body with warm water.  8. Do not shower/wash with your normal soap after using and rinsing off the CHG soap.  9. Pat yourself dry with a clean towel.  10. Wear clean pajamas to bed the night before surgery.  12. Place clean sheets on your bed the night of your first shower and do not sleep with pets.  13. Shower again with the CHG soap on the day of surgery prior to arriving at the hospital.  14. Do not apply any deodorants/lotions/powders.  15. Please wear clean clothes to the hospital.

## 2023-10-13 NOTE — Progress Notes (Signed)
Angelica Dougherty said the [facility] radiology needed her to check and see if she can have MRI biopsy and RF tag placement on the same day.   Spoke with Joellyn Haff at [facility].  She checked with the radiologist Dr. De Nurse and he said it is ok to have both procedures on the same day.

## 2023-10-15 ENCOUNTER — Ambulatory Visit
Admission: RE | Admit: 2023-10-15 | Discharge: 2023-10-15 | Disposition: A | Discharge status: Home / Self Care | Payer: BC Managed Care – PPO | Source: Ambulatory Visit | Attending: Internal Medicine | Admitting: Internal Medicine

## 2023-10-15 ENCOUNTER — Ambulatory Visit: Payer: BC Managed Care – PPO

## 2023-10-15 ENCOUNTER — Other Ambulatory Visit: Payer: BC Managed Care – PPO

## 2023-10-15 ENCOUNTER — Encounter: Payer: Self-pay | Admitting: *Deleted

## 2023-10-15 ENCOUNTER — Encounter (HOSPITAL_COMMUNITY): Payer: Self-pay | Admitting: Urgent Care

## 2023-10-15 DIAGNOSIS — C50212 Malignant neoplasm of upper-inner quadrant of left female breast: Secondary | ICD-10-CM

## 2023-10-15 DIAGNOSIS — Z170 Estrogen receptor positive status [ER+]: Secondary | ICD-10-CM

## 2023-10-15 DIAGNOSIS — D0511 Intraductal carcinoma in situ of right breast: Secondary | ICD-10-CM

## 2023-10-15 DIAGNOSIS — N6313 Unspecified lump in the right breast, lower outer quadrant: Secondary | ICD-10-CM | POA: Diagnosis not present

## 2023-10-15 DIAGNOSIS — N6323 Unspecified lump in the left breast, lower outer quadrant: Secondary | ICD-10-CM | POA: Diagnosis not present

## 2023-10-15 DIAGNOSIS — R928 Other abnormal and inconclusive findings on diagnostic imaging of breast: Secondary | ICD-10-CM

## 2023-10-15 DIAGNOSIS — C50511 Malignant neoplasm of lower-outer quadrant of right female breast: Secondary | ICD-10-CM | POA: Diagnosis not present

## 2023-10-15 DIAGNOSIS — N6324 Unspecified lump in the left breast, lower inner quadrant: Secondary | ICD-10-CM | POA: Diagnosis not present

## 2023-10-15 DIAGNOSIS — C50812 Malignant neoplasm of overlapping sites of left female breast: Secondary | ICD-10-CM | POA: Diagnosis not present

## 2023-10-15 DIAGNOSIS — N6012 Diffuse cystic mastopathy of left breast: Secondary | ICD-10-CM | POA: Diagnosis not present

## 2023-10-15 DIAGNOSIS — N6325 Unspecified lump in the left breast, overlapping quadrants: Secondary | ICD-10-CM | POA: Diagnosis not present

## 2023-10-15 MED ORDER — GADOPICLENOL 0.5 MMOL/ML IV SOLN
7.0000 mL | Freq: Once | INTRAVENOUS | Status: AC | PRN
  Administered 2023-10-15: 7 mL via INTRAVENOUS

## 2023-10-15 NOTE — Progress Notes (Signed)
Lumpectomies rescheduled to 11/18.  Appointments with Dr Rushie Chestnut and Dr. Alena Bills moved to 12/10.   Appt. Details given to her.

## 2023-10-16 LAB — SURGICAL PATHOLOGY

## 2023-10-17 ENCOUNTER — Encounter: Payer: Self-pay | Admitting: Internal Medicine

## 2023-10-17 ENCOUNTER — Telehealth: Payer: Self-pay | Admitting: Licensed Clinical Social Worker

## 2023-10-17 NOTE — Telephone Encounter (Signed)
I contacted Ms. Zeidman to discuss her genetic testing results. No pathogenic variants were identified in the 13 genes analyzed on the [facility] BRCAPlus (STAT) panel. Detailed clinic note to follow. We will call Ms. Kalmbach once the remainder of testing is back. She did let me know her two aunts and cousin on her dad's side had genetic testing and that they were positive for PALB2 mutation. The BRCAPlus panel does test for PALB2 and this was normal.   The test report has been scanned into EP[employer] and is located under the Molecular Pathology section of the Results Review tab.  A portion of the result report is included below for reference.      Lacy Duverney, MS, [facility] Genetic Counselor [email or web address] Phone: [phone]

## 2023-10-20 ENCOUNTER — Other Ambulatory Visit: Payer: BC Managed Care – PPO

## 2023-10-20 ENCOUNTER — Encounter: Payer: Self-pay | Admitting: *Deleted

## 2023-10-20 NOTE — Progress Notes (Signed)
Faxed request to [facility] pathology to add prognostic panel on MRI biopsy results.

## 2023-10-22 ENCOUNTER — Encounter
Admission: RE | Admit: 2023-10-22 | Discharge: 2023-10-22 | Disposition: A | Discharge status: Home / Self Care | Payer: BC Managed Care – PPO | Source: Ambulatory Visit | Attending: General Surgery | Admitting: General Surgery

## 2023-10-22 ENCOUNTER — Encounter: Payer: Self-pay | Admitting: *Deleted

## 2023-10-22 NOTE — Progress Notes (Signed)
Called [facility] pathology to check on faxed request to add prognostics on MRI biopsy.   I spoke with Sherrill Raring he couldn't find the faxed request and asked that it be re-faxed to his attention.   Request re-faxed.  Will continue to monitor for results.

## 2023-10-22 NOTE — Pre-Procedure Instructions (Signed)
PAT f/u, patient reports no changes , will be here for EKG 10/24/23.

## 2023-10-23 ENCOUNTER — Ambulatory Visit (INDEPENDENT_AMBULATORY_CARE_PROVIDER_SITE_OTHER): Payer: BC Managed Care – PPO | Admitting: General Surgery

## 2023-10-23 ENCOUNTER — Encounter: Payer: Self-pay | Admitting: General Surgery

## 2023-10-23 VITALS — BP 169/91 | HR 68 | Temp 98.0°F | Ht 73.0 in | Wt 191.6 lb

## 2023-10-23 DIAGNOSIS — C50212 Malignant neoplasm of upper-inner quadrant of left female breast: Secondary | ICD-10-CM

## 2023-10-23 DIAGNOSIS — C50412 Malignant neoplasm of upper-outer quadrant of left female breast: Secondary | ICD-10-CM

## 2023-10-23 DIAGNOSIS — Z170 Estrogen receptor positive status [ER+]: Secondary | ICD-10-CM

## 2023-10-23 NOTE — Patient Instructions (Signed)
We have spoken today about breast surgery. Your Mastectomy will be scheduled at [facility] with Dr. Maurine Minister.  You will most likely go home the same day but may spend at least 1 night in the hospital following surgery and go home with 1-2 drains for approximately 5-7 days following your surgery. Please keep an accurate record of your drain amount in ml's or cc's. If your drain suddenly stops draining or has drainage around the tube at the skin, call our office and speak with a nurse immediately.  Please see the (blue) pre-care surgery sheet that you have been given today for more information regarding your surgery. Our surgery scheduler will call you to look at surgery dates and to go over information about your surgery.   If you have FMLA or Disability paperwork that needs to be filled out, please have your company fax your paperwork to [phone] or you may drop this by either office. This paperwork will be filled out within 3 days after your surgery has been completed.  Information regarding your surgery has been provided below. If you have any questions or concerns, please call our office and speak with a nurse.   Total or Modified Radical Mastectomy A total mastectomy and a modified radical mastectomy are types of surgery for breast cancer. If you are having a total mastectomy (simple mastectomy), your entire breast will be removed. If you are having a modified radical mastectomy, your breast and nipple will be removed along with the lymph nodes under your arm. You may also have some of the lining over the muscle tissues under your breast removed. LET [facility] CARE PROVIDER KNOW ABOUT: Any allergies you have. All medicines you are taking, including vitamins, herbs, eye drops, creams, and over-the-counter medicines. Previous problems you or members of your family have had with the use of anesthetics. Any blood disorders you have. Previous surgeries you have had. Medical conditions you  have. RISKS AND COMPLICATIONS Generally, this is a safe procedure. However, problems may occur, including: Pain. Infection. Bleeding. Scar tissue. Chest numbness on the side of the surgery. Fluid buildup under the skin flaps where your breast was removed (seroma). Sensation of throbbing or tingling. Stress or sadness from losing your breast. If you have the lymph nodes under your arm removed, you may have arm swelling, weakness, or numbness on the same side of your body as your surgery. BEFORE THE PROCEDURE Ask your health care provider about: Changing or stopping your regular medicines. This is especially important if you are taking diabetes medicines or blood thinners. Taking medicines such as aspirin and ibuprofen. These medicines can thin your blood. Do not take these medicines before your procedure if your health care provider instructs you not to. Follow your health care provider's instructions about eating or drinking restrictions. Plan to have someone take you home after the procedure. PROCEDURE An IV tube will be inserted into one of your veins. You will be given a medicine that makes you fall asleep (general anesthetic). Your breast will be cleaned with a germ-killing solution (antiseptic). A wide incision will be made around your nipple. The skin and nipple inside the incision will be removed along with all breast tissue. If you are having a modified radical mastectomy: The lining over your chest muscles will be removed. The incision may be extended to reach the lymph nodes under your arm, or a second incision may be made. The lymph nodes will be removed. You may have a drainage tube inserted into  your incision to collect fluid that builds up after surgery. This tube is connected to a suction bulb. Your incision or incisions will be closed with stitches (sutures). A bandage (dressing) will be placed over your breast and under your arm. The procedure may vary among health care  providers and hospitals. AFTER THE PROCEDURE You will be moved to a recovery area. Your blood pressure, heart rate, breathing rate, and blood oxygen level will be monitored often until the medicines you were given have worn off. You will be given pain medicine as needed. After a while, you will be taken to a hospital room. You will be encouraged to get up and walk as soon as you can. Your IV tube can be removed when you are able to eat and drink. Your drain may be removed before you go home from the hospital, or you may be sent home with your drain and suction bulb.   This information is not intended to replace advice given to you by your health care provider. Make sure you discuss any questions you have with your health care provider.   Document Released: 08/20/2001 Document Revised: 12/16/2014 Document Reviewed: 08/10/2014 Elsevier Interactive Patient Education [employer]

## 2023-10-24 ENCOUNTER — Other Ambulatory Visit: Payer: BC Managed Care – PPO

## 2023-10-24 ENCOUNTER — Telehealth: Payer: Self-pay | Admitting: *Deleted

## 2023-10-24 NOTE — Telephone Encounter (Signed)
Faxed FMLA to [employer] at [phone] 

## 2023-10-27 ENCOUNTER — Encounter: Payer: Self-pay | Admitting: Internal Medicine

## 2023-10-27 ENCOUNTER — Ambulatory Visit: Admission: RE | Admit: 2023-10-27 | Payer: BC Managed Care – PPO | Source: Home / Self Care | Admitting: General Surgery

## 2023-10-27 ENCOUNTER — Encounter: Admission: RE | Payer: Self-pay | Source: Home / Self Care

## 2023-10-27 ENCOUNTER — Other Ambulatory Visit: Payer: BC Managed Care – PPO

## 2023-10-27 SURGERY — BREAST LUMPECTOMY WITH RADIOFREQUENCY TAG IDENTIFICATION
Anesthesia: General | Laterality: Left

## 2023-10-28 ENCOUNTER — Encounter: Payer: Self-pay | Admitting: Licensed Clinical Social Worker

## 2023-10-28 ENCOUNTER — Ambulatory Visit: Payer: Self-pay | Admitting: Licensed Clinical Social Worker

## 2023-10-28 ENCOUNTER — Ambulatory Visit (INDEPENDENT_AMBULATORY_CARE_PROVIDER_SITE_OTHER): Payer: BC Managed Care – PPO | Admitting: Plastic Surgery

## 2023-10-28 ENCOUNTER — Encounter: Payer: Self-pay | Admitting: Plastic Surgery

## 2023-10-28 VITALS — BP 156/81 | HR 74 | Ht 73.0 in | Wt 190.0 lb

## 2023-10-28 DIAGNOSIS — Z803 Family history of malignant neoplasm of breast: Secondary | ICD-10-CM

## 2023-10-28 DIAGNOSIS — Z170 Estrogen receptor positive status [ER+]: Secondary | ICD-10-CM | POA: Diagnosis not present

## 2023-10-28 DIAGNOSIS — D0511 Intraductal carcinoma in situ of right breast: Secondary | ICD-10-CM | POA: Diagnosis not present

## 2023-10-28 DIAGNOSIS — C50912 Malignant neoplasm of unspecified site of left female breast: Secondary | ICD-10-CM

## 2023-10-28 DIAGNOSIS — Z1379 Encounter for other screening for genetic and chromosomal anomalies: Secondary | ICD-10-CM | POA: Insufficient documentation

## 2023-10-28 DIAGNOSIS — C50911 Malignant neoplasm of unspecified site of right female breast: Secondary | ICD-10-CM

## 2023-10-28 NOTE — Progress Notes (Signed)
HPI:   Angelica Dougherty was previously seen in the [facility] due to a personal and family history of cancer and concerns regarding a hereditary predisposition to cancer. Please refer to our prior cancer genetics clinic note for more information regarding our discussion, assessment and recommendations, at the time. Angelica Dougherty recent genetic test results were disclosed to her, as were recommendations warranted by these results. These results and recommendations are discussed in more detail below.  CANCER HISTORY:  Oncology History  Breast cancer (HCC)  08/26/2023 Mammogram   Screening mammogram IMPRESSION: Further evaluation is suggested for possible calcifications in the right breast.   Further evaluation is suggested for possible mass in the left breast.  Diagnostic mammogram and ultrasound FINDINGS: RIGHT breast: In the upper central right breast at middle depth, there are amorphous and round calcifications in a linear distribution spanning 1.9 cm. These are new compared to most recent mammogram 12/31/2017. No suspicious mass or other findings in the right breast.   LEFT breast: The previously noted possible mass seen in the upper posterior left breast on MLO view only, overlying the pectoralis muscle, persists on additional views as a 1.8 cm irregular mass with indistinct margins in the associated architectural distortion. On tomosynthesis series, the mass localizes to the medial breast. No definite mammographic correlate seen on [facility] M view, likely due to the far posterior location. No suspicious calcifications or other suspicious findings in the left breast.   Targeted ultrasound of the left breast at the 11:30 position 18 cm from the nipple demonstrates a 2.1 x 1.2 x 1.2 cm irregular hypoechoic mass with indistinct and angular margins, corresponding to the mammographic finding.   Targeted ultrasound of the left axilla demonstrates lymph nodes  with normal morphology.   IMPRESSION: 1. Highly suspicious 2.1 cm mass at the left breast 11:30 position. 2. Suspicious linear calcifications in the upper central right breast. 3. No left axillary lymphadenopathy.     09/25/2023 Pathology Results   FINAL DIAGNOSIS       1. Breast, right, needle core biopsy, upper middle depth, ribbon clip :      - DUCTAL CARCINOMA IN SITU, INTERMEDIATE GRADE (2)      - CANNOT RULE OUT FOCAL MICROINVASION      - NECROSIS: PRESENT, COMEDO-TYPE      - CALCIFICATIONS: PRESENT      - DCIS LENGTH: 0.22 CM      - SEE NOTE       2. 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      - TUBULE FORMATION: SCORE 3      - NUCLEAR PLEOMORPHISM: SCORE 3      - MITOTIC COUNT: SCORE 2      - TOTAL SCORE: 8      - OVERALL GRADE: 3      - LYMPHOVASCULAR INVASION: NOT IDENTIFIED      - CANCER LENGTH: 1.1 CM      - CALCIFICATIONS: NOT IDENTIFIED      - SEE NOTE  Results: IMMUNOHISTOCHEMICAL AND MORPHOMETRIC ANALYSIS PERFORMED MANUALLY Estrogen Receptor:  95%, POSITIVE, STRONG STAINING INTENSITY REFERENCE RANGE ESTROGEN RECEPTOR NEGATIVE     0% POSITIVE       =>1% All controls stained appropriately [employer], [place]ha[place], Sports administrator, International aid/development worker ( Signed 10 21 2024) Breast, left, needle core biopsy, 11:30 o'clock, 18cmfn savi scout PROGNOSTIC INDICATORS  Results:  IMMUNOHISTOCHEMICAL AND MORPHOMETRIC ANALYSIS PERFORMED MANUALLY The tumor cells are negative for Her2 (1+). Estrogen Receptor:  90%, POSITIVE, STRONG STAINING INTENSITY Progesterone Receptor:  90%, POSITIVE, STRONG STAINING INTENSITY REFERENCE RANGE ESTROGEN RECEPTOR NEGATIVE     0% POSITIVE       =>1% REFERENCE RANGE PROGESTERONE RECEPTOR NEGATIVE     0% POSITIVE        =>1%    09/30/2023 Cancer Staging   Staging form: Breast, AJCC 8th Edition - Clinical: Stage IIA (cT2, cN0, cM0, G3, ER+, PR+,  HER2-) - Signed by Michaelyn Barter, MD on 09/30/2023 Histologic grading system: 3 grade system    Genetic Testing   Negative genetic testing on the [facility]+CancerNext-Expanded+RNA panel. The final report date is 10/27/2023.  The CancerNext-Expanded gene panel offered by [facility] and includes sequencing, rearrangement, and RNA analysis for the following 71 genes: AIP, ALK, APC, ATM, BAP1, BARD1, BMPR1A, BRCA1, BRCA2, BRIP1, CDC73, CDH1, CDK4, CDKN1B, CDKN2A, CHEK2, DICER1, FH, FLCN, KIF1B, LZTR1, MAX, MEN1, MET, MLH1, MSH2, MSH6, MUTYH, NF1, NF2, NTHL1, PALB2,  PHOX2B, PMS2, POT1, PRKAR1A, PTCH1, PTEN, RAD51C, RAD51D, RB1, RET, SDHA, SDHAF2, SDHB, SDHC, SDHD, SMAD4, SMARCA4, SMARCB1, SMARCE1, STK11, SUFU, TMEM127, TP53, TSC1, TSC2 and VHL (sequencing and deletion/duplication); AXIN2, CTNNA1, EGFR, EGLN1, HOXB13, KIT, MITF, MSH3, PDGFRA, POLD1 and POLE (sequencing only); EPCAM and GREM1 (deletion/duplication only).     FAMILY HISTORY:  We obtained a detailed, 4-generation family history.  Significant diagnoses are listed below: Family History  Problem Relation Age of Onset   Asthma Mother    Cancer Maternal Uncle        back cancer at 6   Breast cancer Paternal Aunt        dx 30s, bilateral   Breast cancer Paternal Aunt        dx 29s   Breast cancer Cousin        dx 59s   Angelica Dougherty has 1 daughter, 43. She has 1 brother, 1 sister, no cancers.    Angelica Dougherty mother is living at 26. A maternal uncle had back cancer at 65 and is living at 2.    Angelica Dougherty's father is living at 62. Two paternal aunts had breast cancer, one in her 30s (bilateral) and one in her 45s. A paternal cousin had breast cancer in her 17s and is living at 70, unknown if she has had genetic testing.UPDATE: patient spoke with cousin - she and 2 aunts tested positive for PALB2 mutation. We did not find a PALB2 mutation on Angelica Dougherty's testing.   Angelica Dougherty is unaware of previous family  history of genetic testing for hereditary cancer risks. There is no reported Ashkenazi Jewish ancestry. There isn o known consanguinity.      GENETIC TEST RESULTS:  The [facility] BRCAPlus + CancerNext-Expanded+RNA Panel found no pathogenic mutations.   The CancerNext-Expanded gene panel offered by [facility] and includes sequencing, rearrangement, and RNA analysis for the following 71 genes: AIP, ALK, APC, ATM, BAP1, BARD1, BMPR1A, BRCA1, BRCA2, BRIP1, CDC73, CDH1, CDK4, CDKN1B, CDKN2A, CHEK2, DICER1, FH, FLCN, KIF1B, LZTR1, MAX, MEN1, MET, MLH1, MSH2, MSH6, MUTYH, NF1, NF2, NTHL1, PALB2, PHOX2B, PMS2, POT1, PRKAR1A, PTCH1, PTEN, RAD51C, RAD51D, RB1, RET, SDHA, SDHAF2, SDHB, SDHC, SDHD, SMAD4, SMARCA4, SMARCB1, SMARCE1, STK11, SUFU, TMEM127, TP53, TSC1, TSC2 and VHL (sequencing and deletion/duplication); AXIN2, CTNNA1, EGFR, EGLN1, HOXB13, KIT, MITF, MSH3, PDGFRA, POLD1 and POLE (sequencing only); EPCAM and GREM1 (deletion/duplication only).  The test report has been scanned into EPIC and is located  under the Molecular Pathology section of the Results Review tab.  A portion of the result report is included below for reference. Genetic testing reported out on 10/27/23.       Even though a pathogenic variant was not identified, possible explanations for the cancer in the family may include: There may be no hereditary risk for cancer in the family. The cancers in Ms. Wiegand and/or her family may be sporadic/familial or due to other genetic and environmental factors. There may be a gene mutation in one of these genes that current testing methods cannot detect but that chance is small. There could be another gene that has not yet been discovered, or that we have not yet tested, that is responsible for the cancer diagnoses in the family.  It is also possible there is a hereditary cause for the cancer in the family that Ms. Isakson did not inherit.  Therefore, it is important to remain in  touch with cancer genetics in the future so that we can continue to offer Ms. Hotard the most up to date genetic testing.   ADDITIONAL GENETIC TESTING:  We discussed with Ms. Strawderman that her genetic testing was fairly extensive.  If there are additional relevant genes identified to increase cancer risk that can be analyzed in the future, we would be happy to discuss and coordinate this testing at that time.    CANCER SCREENING RECOMMENDATIONS:  Ms. Sui test result is considered negative (normal).  This means that we have not identified a hereditary cause for her personal and family history of cancer at this time.   An individual's cancer risk and medical management are not determined by genetic test results alone. Overall cancer risk assessment incorporates additional factors, including personal medical history, family history, and any available genetic information that may result in a personalized plan for cancer prevention and surveillance. Therefore, it is recommended she continue to follow the cancer management and screening guidelines provided by her oncology and primary healthcare provider.  RECOMMENDATIONS FOR FAMILY MEMBERS:   Since she did not inherit a identifiable mutation in a cancer predisposition gene included on this panel, her children could not have inherited a known mutation from her in one of these genes. Individuals in this family might be at some increased risk of developing cancer, over the general population risk, due to the family history of cancer.  Individuals in the family should notify their providers of the family history of cancer. We recommend women in this family have a yearly mammogram beginning at age 16, or 80 years younger than the earliest onset of cancer, an annual clinical breast exam, and perform monthly breast self-exams.  Family members should have colonoscopies by at age 66, or earlier, as recommended by their providers. Other members of the  family may still carry a pathogenic variant in one of these genes that Ms. Bonawitz did not inherit. Based on the family history, we recommend her paternal relatives have genetic counseling and testing for the known familial PALB2 mutation. Ms. Mantovani will let us know if we can be of any assistance in coordinating genetic counseling and/or testing for these family members.  FOLLOW-UP:  Lastly, we discussed with Ms. Pals that cancer genetics is a rapidly advancing field and it is possible that new genetic tests will be appropriate for her and/or her family members in the future. We encouraged her to remain in contact with cancer genetics on an annual basis so we can update her personal and family histories  and let her know of advances in cancer genetics that may benefit this family.   Our contact number was provided. Ms. Niederhauser questions were answered to her satisfaction, and she knows she is welcome to call us at anytime with additional questions or concerns.    Lacy Duverney, MS, [facility] Genetic Counselor [email or web address] Phone: [phone]

## 2023-10-28 NOTE — Progress Notes (Signed)
Patient ID: Angelica Dougherty, female    DOB: Dec 23, 1970, 52 y.o.   MRN: [id number]   Chief Complaint  Patient presents with   Consult        Breast Cancer    The patient is a 52 year old female here for a consultation for breast reconstruction.  She is accompanied by her mom.  She was diagnosed with breast cancer in September after a screening mammogram was done.  She had calcifications in the right breast with concerning areas in the left breast.  She was BI-RADS 5.  She underwent biopsies of both breasts.  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  Her right breast biopsy showed DCIS.  It is estrogen and progesterone positive the patient is otherwise in very good health.  She is not a smoker and does not have diabetes.  Her past family history is positive for breast cancer in aunts and a cousin.  She is 6 feet 1 inch tall and weighs 190 pounds.  Preoperatively she is a DD cup.  She has decided on bilateral mastectomies.  She does want reconstruction.  Radiation is not planned. Her past medical history includes hyperlipidemia.  Her past surgical history includes breast biopsy, hysterectomy and C-section.      Review of Systems  Constitutional: Negative.   HENT: Negative.    Eyes: Negative.   Respiratory: Negative.    Cardiovascular: Negative.   Gastrointestinal: Negative.   Endocrine: Negative.   Genitourinary: Negative.   Musculoskeletal: Negative.   Neurological: Negative.     Past Medical History:  Diagnosis Date   Allergy     Past Surgical History:  Procedure Laterality Date   ABDOMINAL HYSTERECTOMY  06/2006   Total hysterctomy excessive bleeding and fibroids per patient report. [facility]   BREAST BIOPSY Right 09/25/2023   stereo bx, calcs, RIBBON clip-path pending   BREAST BIOPSY Left 09/25/2023   [place] bx, savi tag as marker, path pending   BREAST BIOPSY Right 09/25/2023   MM RT BREAST BX W LOC DEV 1ST LESION IMAGE BX SPEC STEREO GUIDE  09/25/2023 [facility]   BREAST BIOPSY Left 09/25/2023   [place] LT BREAST BX W LOC DEV 1ST LESION IMG BX SPEC US GUIDE 09/25/2023 [facility]   BREAST EXCISIONAL BIOPSY Right    benign   BREAST LUMPECTOMY Right    benign   CESAREAN SECTION  12/16/2005      Current Outpatient Medications:    acetaminophen (TYLENOL) 500 MG tablet, Take 1,000 mg by mouth every 6 (six) hours as needed., Disp: , Rfl:    Objective:   Vitals:   10/28/23 1110  BP: (!) 156/81  Pulse: 74  SpO2: 96%    Physical Exam Vitals and nursing note reviewed.  Constitutional:      Appearance: Normal appearance.  HENT:     Head: Normocephalic and atraumatic.  Cardiovascular:     Rate and Rhythm: Normal rate.     Pulses: Normal pulses.  Pulmonary:     Effort: Pulmonary effort is normal.  Abdominal:     Palpations: Abdomen is soft.  Musculoskeletal:        General: No swelling or deformity.  Skin:    General: Skin is warm.     Capillary Refill: Capillary refill takes less than 2 seconds.     Coloration: Skin is not jaundiced.  Neurological:     Mental Status: She is alert and oriented to person, place, and time.  Psychiatric:  Mood and Affect: Mood normal.        Behavior: Behavior normal.        Thought Content: Thought content normal.        Judgment: Judgment normal.     Assessment & Plan:  Malignant neoplasm of right breast in female, estrogen receptor positive, unspecified site of breast (HCC)  Ductal carcinoma in situ (DCIS) of right breast  The options for reconstruction we explained to the patient / family for breast reconstruction.  There are two general categories of reconstruction.  We can reconstruction a breast with implants or use the patient's own tissue.  These were further discussed as listed.  Breast reconstruction is an optional procedure and eligibility depends on the full spectrum of the health of the patient and any co-morbidities.  More than one surgery is often  needed to complete the reconstruction process.  The process can take three to twelve months to complete.  The breasts will not be identical due to many factors such as rib differences, shoulder asymmetry and treatments such as radiation.  The goal is to get the breasts to look normal and symmetrical in clothes.  Scars are a part of surgery and may fade some in time but will always be present under clothes.  Surgery may be an option on the non-cancer breast to achieve more symmetry.  No matter which procedure is chosen there is always the risk of complications and even failure of the body to heal.  This could result in no breast.    The options for reconstruction include:  1. Placement of a tissue expander with Acellular dermal matrix. When the expander is the desired size surgery is performed to remove the expander and place an implant.  In some cases the implant can be placed without an expander.  2. Autologous reconstruction can include using a muscle or tissue from another area of the body to create a breast.  3. Combined procedures (ie. latissismus dorsi flap) can be done with an expander / implant placed under the muscle.   The risks, benefits, scars and recovery time were discussed for each of the above. Risks include bleeding, infection, hematoma, seroma, scarring, pain, wound healing complications, flap loss, fat necrosis, capsular contracture, need for implant removal, donor site complications, bulge, hernia, umbilical necrosis, need for urgent reoperation, and need for dressing changes.  The procedure the patient selected / that was best for the patient, was then discussed in further detail.  Total time: 45 minutes. This includes time spent with the patient during the visit as well as time spent before and after the visit reviewing the chart, documenting the encounter, making phone calls and reviewing studies.   The patient does not want to do autologous reconstruction.  She would like to do  implant-based reconstruction.  We would start with expanders and then moved to the implants approximately 3 months later.  She is a candidate for the over the muscle technique with the ADM.  Her general surgeon is Dr. Baker Pierini at [facility].  Pictures were obtained of the patient and placed in the chart with the patient's or guardian's permission.   Alena Bills Laurice Iglesia, DO

## 2023-10-28 NOTE — Progress Notes (Signed)
Outpatient Surgical Follow Up  10/28/2023  Angelica Dougherty is an 52 y.o. female.   Chief Complaint  Patient presents with   Follow-up    Discuss breast surgery    HPI: The patient returns today to discuss her new biopsy findings as well as to discuss her surgical options.  In the interim she had undergone further sampling of her right breast with biopsy.  These biopsies were positive for invasive carcinoma.  This is new as her previous biopsies only showed DCIS.  In her left breast she also had additional spots of concern.  She is concerned that if she continues to have multiple areas of concern then she sees no reason to not perform a bilateral mastectomy.  She denies any breast changes including the breast pain, skin changes or discharge from the nipple.   Oncology History  Breast cancer (HCC)  08/26/2023 Mammogram   Screening mammogram IMPRESSION: Further evaluation is suggested for possible calcifications in the right breast.   Further evaluation is suggested for possible mass in the left breast.  Diagnostic mammogram and ultrasound FINDINGS: RIGHT breast: In the upper central right breast at middle depth, there are amorphous and round calcifications in a linear distribution spanning 1.9 cm. These are new compared to most recent mammogram 12/31/2017. No suspicious mass or other findings in the right breast.   LEFT breast: The previously noted possible mass seen in the upper posterior left breast on MLO view only, overlying the pectoralis muscle, persists on additional views as a 1.8 cm irregular mass with indistinct margins in the associated architectural distortion. On tomosynthesis series, the mass localizes to the medial breast. No definite mammographic correlate seen on [facility] M view, likely due to the far posterior location. No suspicious calcifications or other suspicious findings in the left breast.   Targeted ultrasound of the left breast at the 11:30 position 18  cm from the nipple demonstrates a 2.1 x 1.2 x 1.2 cm irregular hypoechoic mass with indistinct and angular margins, corresponding to the mammographic finding.   Targeted ultrasound of the left axilla demonstrates lymph nodes with normal morphology.   IMPRESSION: 1. Highly suspicious 2.1 cm mass at the left breast 11:30 position. 2. Suspicious linear calcifications in the upper central right breast. 3. No left axillary lymphadenopathy.     09/25/2023 Pathology Results   FINAL DIAGNOSIS       1. Breast, right, needle core biopsy, upper middle depth, ribbon clip :      - DUCTAL CARCINOMA IN SITU, INTERMEDIATE GRADE (2)      - CANNOT RULE OUT FOCAL MICROINVASION      - NECROSIS: PRESENT, COMEDO-TYPE      - CALCIFICATIONS: PRESENT      - DCIS LENGTH: 0.22 CM      - SEE NOTE       2. 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      - TUBULE FORMATION: SCORE 3      - NUCLEAR PLEOMORPHISM: SCORE 3      - MITOTIC COUNT: SCORE 2      - TOTAL SCORE: 8      - OVERALL GRADE: 3      - LYMPHOVASCULAR INVASION: NOT IDENTIFIED      - CANCER LENGTH: 1.1 CM      - CALCIFICATIONS: NOT IDENTIFIED      - SEE NOTE  Results: IMMUNOHISTOCHEMICAL AND  MORPHOMETRIC ANALYSIS PERFORMED MANUALLY Estrogen Receptor:  95%, POSITIVE, STRONG STAINING INTENSITY REFERENCE RANGE ESTROGEN RECEPTOR NEGATIVE     0% POSITIVE       =>1% All controls stained appropriately Arville [employer], Zhaoli, Sports administrator, International aid/development worker ( Signed 10 21 2024) Breast, left, needle core biopsy, 11:30 o'clock, 18cmfn savi scout PROGNOSTIC INDICATORS  Results: IMMUNOHISTOCHEMICAL AND MORPHOMETRIC ANALYSIS PERFORMED MANUALLY The tumor cells are negative for Her2 (1+). Estrogen Receptor:  90%, POSITIVE, STRONG STAINING INTENSITY Progesterone Receptor:  90%, POSITIVE, STRONG STAINING INTENSITY REFERENCE RANGE ESTROGEN RECEPTOR NEGATIVE      0% POSITIVE       =>1% REFERENCE RANGE PROGESTERONE RECEPTOR NEGATIVE     0% POSITIVE        =>1%    09/30/2023 Cancer Staging   Staging form: Breast, AJCC 8th Edition - Clinical: Stage IIA (cT2, cN0, cM0, G3, ER+, PR+, HER2-) - Signed by Michaelyn Barter, MD on 09/30/2023 Histologic grading system: 3 grade system    Genetic Testing   Negative genetic testing on the [facility] BRCAPlus+CancerNext-Expanded+RNA panel. The final report date is 10/27/2023.  The CancerNext-Expanded gene panel offered by [facility] and includes sequencing, rearrangement, and RNA analysis for the following 71 genes: AIP, ALK, APC, ATM, BAP1, BARD1, BMPR1A, BRCA1, BRCA2, BRIP1, CDC73, CDH1, CDK4, CDKN1B, CDKN2A, CHEK2, DICER1, FH, FLCN, KIF1B, LZTR1, MAX, MEN1, MET, MLH1, MSH2, MSH6, MUTYH, NF1, NF2, NTHL1, PALB2,  PHOX2B, PMS2, POT1, PRKAR1A, PTCH1, PTEN, RAD51C, RAD51D, RB1, RET, SDHA, SDHAF2, SDHB, SDHC, SDHD, SMAD4, SMARCA4, SMARCB1, SMARCE1, STK11, SUFU, TMEM127, TP53, TSC1, TSC2 and VHL (sequencing and deletion/duplication); AXIN2, CTNNA1, EGFR, EGLN1, HOXB13, KIT, MITF, MSH3, PDGFRA, POLD1 and POLE (sequencing only); EPCAM and GREM1 (deletion/duplication only).     Past Medical History:  Diagnosis Date   Allergy     Past Surgical History:  Procedure Laterality Date   ABDOMINAL HYSTERECTOMY  06/2006   Total hysterctomy excessive bleeding and fibroids per patient report. [facility]mer[facility]rn   BREAST BIOPSY Right 09/25/2023   stereo bx, calcs, RIBBON clip-path pending   BREAST BIOPSY Left 09/25/2023   [place] bx, savi tag as marker, path pending   BREAST BIOPSY Right 09/25/2023   MM RT BREAST BX W LOC DEV 1ST LESION IMAGE BX SPEC STEREO GUIDE 09/25/2023 [facility]   BREAST BIOPSY Left 09/25/2023   [place] LT BREAST BX W LOC DEV 1ST LESION IMG BX SPEC US GUIDE 09/25/2023 [facility]   BREAST EXCISIONAL BIOPSY Right    benign   BREAST LUMPECTOMY Right    benign   CESAREAN SECTION  12/16/2005    Family  History  Problem Relation Age of Onset   Asthma Mother    Cancer Maternal Uncle        back cancer at 10   Breast cancer Paternal Aunt        dx 30s, bilateral   Breast cancer Paternal Aunt        dx 73s   Breast cancer Cousin        dx 37s    Social History:  reports that she has never smoked. She has never used smokeless tobacco. She reports that she does not drink alcohol and does not use drugs.  Allergies:  Allergies  Allergen Reactions   Codeine Other (See Comments)    Swelling    Medications reviewed.    ROS Full ROS performed and is otherwise negative other than what is stated in HPI   BP (!) 169/91 (BP Location: Right Arm, Patient Position: Sitting, Cuff Size: Large)  Pulse 68   Temp 98 F (36.7 C) (Oral)   Ht 6\' 1"  (1.854 m)   Wt 191 lb 9.6 oz (86.9 kg)   LMP 06/08/2006 (Approximate) Comment: Hysterectomy  SpO2 97%   BMI 25.28 kg/m   Physical Exam  No acute distress, PERRLA, moving all extremities spontaneously, normal work of breathing on room air.  I have examined her breast previously so we deferred breast exam today.   No results found for this or any previous visit (from the past 48 hour(s)). No results found.  Assessment/Plan: The patient and I had a long discussion about the possible treatment options for her.  Her lesions are scattered throughout her breast and my concern is that if she elected for any breast conserving therapy then this would lead to poor cosmetic outcomes and possibly poor oncologic outcomes.  She now, given her multiple biopsy-proven abnormalities and invasive cancer, is electing for bilateral mastectomies.  I also discussed with her that given she has elected to not undergo breast conserving therapy she would not need to have radiation.  I discussed with her that we will sample the lymph nodes bilaterally with a sentinel lymph node biopsy and that this would be a turn determinant of whether she needs chemotherapy or not.  I also  discussed that there are reconstructive options and offered her a referral to plastic and reconstructive surgery.  She is interested in undergoing reconstruction so we will have her see their team.  I discussed the risk, benefits and alternatives of bilateral mastectomy including the risk infection, bleeding, disease recurrence, wound breakdown and need for axillary dissection if she has multiple positive axillary lymph nodes.  I discussed with her that we would do this within the tandem case with her plastic and reconstructive surgery colleagues.  I also told her that she should expect to stay into the hospital for 1 night.  She understands these risks and wishes to proceed with bilateral mastectomy with PRS reconstruction.  We will have her see the PRS team 1. Malignant neoplasm of upper-outer quadrant of left breast in female, estrogen receptor positive (HCC)  - Ambulatory referral to Plastic Surgery  2. Malignant neoplasm of upper-inner quadrant of left female breast, unspecified estrogen receptor status (HCC)  - Ambulatory referral to Plastic Surgery    Baker Pierini, M.D. [facility]

## 2023-10-29 ENCOUNTER — Inpatient Hospital Stay: Payer: BC Managed Care – PPO | Attending: Internal Medicine | Admitting: Hospice and Palliative Medicine

## 2023-10-29 DIAGNOSIS — D0511 Intraductal carcinoma in situ of right breast: Secondary | ICD-10-CM

## 2023-10-29 NOTE — Progress Notes (Signed)
Multidisciplinary Oncology Council Documentation  Angelica Dougherty was presented by our [facility] on 10/29/2023, which included representatives from:  Palliative Care Dietitian  Physical/Occupational Therapist Nurse Navigator Genetics Social work Survivorship RN Financial Navigator Research RN   Angelica Dougherty currently presents with history of breast cancer  We reviewed previous medical and familial history, history of present illness, and recent lab results along with all available histopathologic and imaging studies. The MOC considered available treatment options and made the following recommendations/referrals:  SW, rehab screening  The MOC is a meeting of clinicians from various specialty areas who evaluate and discuss patients for whom a multidisciplinary approach is being considered. Final determinations in the plan of care are those of the provider(s).   Today's extended care, comprehensive team conference, Angelica Dougherty was not present for the discussion and was not examined.

## 2023-10-30 ENCOUNTER — Inpatient Hospital Stay: Payer: BC Managed Care – PPO

## 2023-10-30 ENCOUNTER — Other Ambulatory Visit: Payer: Self-pay

## 2023-10-30 DIAGNOSIS — C50911 Malignant neoplasm of unspecified site of right female breast: Secondary | ICD-10-CM

## 2023-10-30 DIAGNOSIS — C50412 Malignant neoplasm of upper-outer quadrant of left female breast: Secondary | ICD-10-CM

## 2023-10-30 NOTE — Progress Notes (Signed)
[facility] Clinical Social Work  Initial Assessment   Angelica Dougherty is a 52 y.o. year old female contacted by phone. Clinical Social Work was referred by  [facility]  for assessment of psychosocial needs.   SDOH (Social Determinants of Health) assessments performed: Yes SDOH Interventions    Flowsheet Row Office Visit from 08/15/2023 in [facility]  SDOH Interventions   Food Insecurity Interventions Intervention Not Indicated  Housing Interventions Intervention Not Indicated  Transportation Interventions Intervention Not Indicated  Utilities Interventions Intervention Not Indicated  Financial Strain Interventions Intervention Not Indicated  Physical Activity Interventions Intervention Not Indicated  Stress Interventions Intervention Not Indicated  Social Connections Interventions Intervention Not Indicated       SDOH Screenings   Food Insecurity: No Food Insecurity (08/15/2023)  Housing: Low Risk  (08/15/2023)  Transportation Needs: No Transportation Needs (08/15/2023)  Utilities: Not At Risk (08/15/2023)  Alcohol Screen: Low Risk  (08/15/2023)  Depression (PHQ2-9): Low Risk  (08/15/2023)  Financial Resource Strain: Low Risk  (08/15/2023)  Physical Activity: Insufficiently Active (08/15/2023)  Social Connections: Moderately Integrated (08/15/2023)  Stress: No Stress Concern Present (08/15/2023)  Tobacco Use: Low Risk  (10/28/2023)     Distress Screen completed: No     No data to display            Family/Social Information:  Housing Arrangement: patient lives with her daughter. Family members/support persons in your life? Family Transportation concerns: no  Employment: Working full time Income source: Educational psychologist concerns: Yes, due to illness and/or loss of work during treatment Type of concern:  Engineering geologist access concerns: no Religious or spiritual practice: Yes-Patient identifies as Engineer, manufacturing systems Currently in place:  BCBS  Coping/ Adjustment to  diagnosis: Patient understands treatment plan and what happens next? yes Concerns about diagnosis and/or treatment: Losing my job and/or losing income Patient reported stressors: Therapist, art and/or priorities: Family Current coping skills/ strengths: Average or above average intelligence , Capable of independent living , Manufacturing systems engineer , Contractor , General fund of knowledge , Motivation for treatment/growth , and Supportive family/friends     SUMMARY: Current SDOH Barriers:  Financial constraints related to not working during recovery.  Clinical Social Work Clinical Goal(s):  Explore community resource options for unmet needs related to:  Financial Strain   Interventions: Discussed common feeling and emotions when being diagnosed with cancer, and the importance of support during treatment Informed patient of the support team roles and support services at [facility] Provided CSW contact information and encouraged patient to call with any questions or concerns Provided patient with information about the [employer] and breast cancer grants.  CSW to email her information per her request.   Follow Up Plan: Patient will contact CSW with any support or resource needs Patient verbalizes understanding of plan: Yes    Dorothey Baseman, LCSW Clinical Social Worker [facility]

## 2023-11-10 ENCOUNTER — Ambulatory Visit: Payer: BC Managed Care – PPO | Admitting: Internal Medicine

## 2023-11-10 ENCOUNTER — Institutional Professional Consult (permissible substitution): Payer: BC Managed Care – PPO | Admitting: Radiation Oncology

## 2023-11-10 ENCOUNTER — Encounter: Payer: Self-pay | Admitting: Plastic Surgery

## 2023-11-12 ENCOUNTER — Encounter: Payer: Self-pay | Admitting: *Deleted

## 2023-11-12 ENCOUNTER — Telehealth: Payer: Self-pay | Admitting: General Surgery

## 2023-11-12 ENCOUNTER — Ambulatory Visit: Payer: BC Managed Care – PPO | Admitting: Occupational Therapy

## 2023-11-12 NOTE — Telephone Encounter (Signed)
Patient has been advised of Pre-Admission date/time, and Surgery date at [facility].  Surgery Date: 12/22/23 Preadmission Testing Date: 12/15/23 (phone 8a-1p)  Patient has been made aware to arrive at 7:30 am on 12/22/23 as will be having SLN bx injection done prior to her surgery.

## 2023-11-12 NOTE — Progress Notes (Signed)
Bil mastectomies are scheduled for 12/22/23.  She will follow up with  Dr. Alena Bills post surgery on 01/12/24.

## 2023-11-17 ENCOUNTER — Ambulatory Visit: Payer: BC Managed Care – PPO | Admitting: Internal Medicine

## 2023-11-17 ENCOUNTER — Institutional Professional Consult (permissible substitution): Payer: BC Managed Care – PPO | Admitting: Radiation Oncology

## 2023-11-18 ENCOUNTER — Institutional Professional Consult (permissible substitution): Payer: BC Managed Care – PPO | Admitting: Radiation Oncology

## 2023-11-18 ENCOUNTER — Ambulatory Visit: Payer: BC Managed Care – PPO | Admitting: Internal Medicine

## 2023-11-19 ENCOUNTER — Inpatient Hospital Stay: Payer: BC Managed Care – PPO | Attending: Internal Medicine | Admitting: Occupational Therapy

## 2023-11-19 DIAGNOSIS — R293 Abnormal posture: Secondary | ICD-10-CM | POA: Insufficient documentation

## 2023-11-19 NOTE — Therapy (Signed)
OUTPATIENT OCCUPATIONAL THERAPY BREAST CANCER PREOP VISIT   Patient Name: Angelica Dougherty MRN: [id number] DOB:[birth year], 52 y.o., female Today's Date: 11/19/2023  END OF SESSION:  OT End of Session - 11/19/23 1804     Visit Number 2    Number of Visits 2    Date for OT Re-Evaluation 11/19/23    OT Start Time 1431    OT Stop Time 1455    OT Time Calculation (min) 24 min    Activity Tolerance Patient tolerated treatment well    Behavior During Therapy [facility] for tasks assessed/performed             Past Medical History:  Diagnosis Date   Allergy    Past Surgical History:  Procedure Laterality Date   ABDOMINAL HYSTERECTOMY  06/2006   Total hysterctomy excessive bleeding and fibroids per patient report. [place]hermer[facility]rn   BREAST BIOPSY Right 09/25/2023   stereo bx, calcs, RIBBON clip-path pending   BREAST BIOPSY Left 09/25/2023   [place] bx, savi tag as marker, path pending   BREAST BIOPSY Right 09/25/2023   MM RT BREAST BX W LOC DEV 1ST LESION IMAGE BX SPEC STEREO GUIDE 09/25/2023 [facility]   BREAST BIOPSY Left 09/25/2023   [place] LT BREAST BX W LOC DEV 1ST LESION IMG BX SPEC US GUIDE 09/25/2023 [facility]   BREAST EXCISIONAL BIOPSY Right    benign   BREAST LUMPECTOMY Right    benign   CESAREAN SECTION  12/16/2005   Patient Active Problem List   Diagnosis Date Noted   Genetic testing 10/28/2023   Breast cancer (HCC) 09/30/2023   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   Annual physical exam 08/15/2023   Screening mammogram for breast cancer 08/15/2023   Screen for colon cancer 08/15/2023   Post-menopausal 08/15/2023   HLD (hyperlipidemia) 08/03/2007    PCP: Suzie Portela NP  REFERRING PROVIDER: DR Geryl Councilman DIAG: Breast CA with bil lumpectomy  THERAPY DIAG:  Abnormal posture  Rationale for Evaluation and Treatment: Rehabilitation  ONSET DATE: 09/30/23  SUBJECTIVE:                                                                                                                                                                                            SUBJECTIVE STATEMENT: My MRI came back after seeing you last time and showed more involvement.  So I am going to have a double mastectomy with reconstruction instead of lumpectomy.  So wanted to see you for reviewing some exercises with me. PERTINENT HISTORY:  Patient was diagnosed with bilateral  breast cancer - plan is to have bilateral mastectomy with same time reconstruction  with expanders by Dr. Ulice Bold PATIENT GOALS:   reduce lymphedema risk and learn post op HEP.   PAIN:  Are you having pain? No  PRECAUTIONS: Active CA     HAND DOMINANCE: left  WEIGHT BEARING RESTRICTIONS: No  FALLS:  Has patient fallen in last 6 months? 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   LEISURE: read , spend time with daughter    OBJECTIVE:  COGNITION: Overall cognitive status: Within functional limits for tasks assessed    POSTURE:  Forward head and rounded shoulders posture  UPPER EXTREMITY AROM/PROM:  A/PROM RIGHT   eval   Shoulder extension   Shoulder flexion 165  Shoulder abduction 165  Shoulder internal rotation   Shoulder external rotation 85    (Blank rows = not tested)  A/PROM LEFT   eval  Shoulder extension   Shoulder flexion 165  Shoulder abduction 165  Shoulder internal rotation   Shoulder external rotation 85    (Blank rows = not tested)  CERVICAL AROM: All within normal limits:     UPPER EXTREMITY STRENGTH: 5/5 for bilateral shoulder in all planes   LYMPHEDEMA ASSESSMENTS:   LANDMARK RIGHT   eval  10 cm proximal to olecranon process   Olecranon process   10 cm proximal to ulnar styloid process   Just proximal to ulnar styloid process   Across hand at thumb web space   At base of 2nd digit   (Blank rows = not tested)  LANDMARK LEFT   eval   10 cm proximal to olecranon process   Olecranon process   10 cm proximal to ulnar styloid process   Just proximal to ulnar styloid process   Across hand at thumb web space   At base of 2nd digit   (Blank rows = not tested)  L-DEX LYMPHEDEMA SCREENING:  The patient was assessed using the L-Dex machine today to produce a lymphedema index baseline score. The patient will be reassessed on a regular basis (typically every 3 months) to obtain new L-Dex scores. If the score is > 6.5 points away from his/her baseline score indicating onset of subclinical lymphedema, it will be recommended to wear a compression garment for 4 weeks, 12 hours per day and then be reassessed. If the score continues to be > 6.5 points from baseline at reassessment, we will initiate lymphedema treatment. Assessing in this manner has a 95% rate of preventing clinically significant lymphedema.  5.6 score - L UE at risk  R UE dominant - no lymph nodes remove L-Dex score at baseline   PATIENT EDUCATION:  Education details: Lymphedema risk reduction and post op shoulder/posture HEP Person educated: Patient Education method: Explanation, Demonstration, Handout Education comprehension: Patient verbalized understanding and returned demonstration  HOME EXERCISE PROGRAM: Patient was instructed today in a home exercise program for post op mastectomy for shoulder range of motion. These included active assist shoulder flexion and abduction supine using a wand, scapular retraction, and external rotation 1 arm at a time before trying bilateral in supine..  Patient to keep it at a slight pull less than a 2/10.  Patient to follow-up plastic surgery recommendation and precautions after surgery.  As well as keeping it below 90 degrees until first postop visit as well as while having drains in.  Can progress later if approved by surgeons to do wall walking/slides with shoulder flexion abduction.   She was encouraged to  do these  2-3 x day  for 10 reps -  when permitted by her physician/surgeon.   ASSESSMENT:  CLINICAL IMPRESSION: Her multidisciplinary medical team has met to assess and determine a recommended treatment plan. She is planning to have bilateral mastectomy now with immediate reconstruction using expanders.   She will benefit from a post op OT reassessment to determine needs and from L-Dex screens every 3 months for 2 years to detect subclinical lymphedema.  Pt will benefit from skilled therapeutic intervention to improve on the following deficits: Decreased knowledge of precautions and lymphedema education, impaired UE functional use, pain, decreased ROM, postural dysfunction.   OT treatment/interventions: ADL/self-care home management, pt/family education, therapeutic exercise,manual therapy  REHAB POTENTIAL: Good  CLINICAL DECISION MAKING: Stable/uncomplicated  EVALUATION COMPLEXITY: Low   GOALS: Goals reviewed with patient? YES  LONG TERM GOALS: (STG=LTG)    Name Target Date Goal status  1 Pt will be able to verbalize understanding of pertinent lymphedema risk reduction practices relevant to her dx specifically related to skin care.  Baseline:  No knowledge 6 wks 6 wks   2 Pt will be able to return demo and/or verbalize understanding of the post op HEP related to regaining shoulder ROM. Baseline:  No knowledge MET Achieved at eval       4 Pt will demo she has regained full shoulder ROM and function post operatively compared to baselines.  Baseline: See objective measurements taken today. 6wks Initial    PLAN:  OT FREQUENCY/DURATION: EVAL and 1 follow up appointment.   PLAN FOR NEXT SESSION: will reassess 3-4 weeks post op to determine needs.   Patient will follow up at outpatient cancer rehab 3-4 weeks following surgery.  If the patient requires occupational therapy at that time, a specific plan will be dictated and sent to the referring physician for approval.  Occupational Therapy Information  for After Breast Cancer Surgery/Treatment:  Lymphedema is a swelling condition that you may be at risk for in your arm if you have lymph nodes removed from the armpit area.  After a sentinel node biopsy, the risk is approximately 5-9% and is higher after an axillary node dissection.  There is treatment available for this condition and it is not life-threatening.  Contact your physician or occupational therapist with concerns. You may begin the 4 shoulder/posture exercises (see additional sheet) when permitted by your physician (typically a week after surgery).  If you have drains, you may need to wait until those are removed before beginning range of motion exercises.  A general recommendation is to not lift your arms above shoulder height until drains are removed.  These exercises should be done to your tolerance and gently.  This is not a "no pain/no gain" type of recovery so listen to your body and stretch into the range of motion that you can tolerate, stopping if you have pain.  If you are having immediate reconstruction, ask your plastic surgeon about doing exercises as he or she may want you to wait. We encourage you to watch the ABC (After Breast Cancer)  video. You will learn information related to lymphedema risk, prevention and treatment and additional exercises to regain mobility following surgery.  While undergoing any medical procedure or treatment, try to avoid blood pressure being taken or needle sticks from occurring on the arm on the side of cancer.   This recommendation begins after surgery and continues for the rest of your life.  This may help reduce your risk of getting lymphedema (  swelling in your arm). An excellent resource for those seeking information on lymphedema is the National Lymphedema Network's web site. It can be accessed at www.lymphnet.org If you notice swelling in your hand, arm or breast at any time following surgery (even if it is many years from now), please contact  your doctor or occupational therapist to discuss this.  Lymphedema can be treated at any time but it is easier for you if it is treated early on.  If you feel like your shoulder motion is not returning to normal in a reasonable amount of time, please contact your surgeon or occupational therapist.  [facility] [phone]. [address] [place], [place] [address]      Oletta Cohn, OTR/L,CLT 11/19/2023, 6:06 PM

## 2023-12-09 ENCOUNTER — Ambulatory Visit (INDEPENDENT_AMBULATORY_CARE_PROVIDER_SITE_OTHER): Payer: BC Managed Care – PPO | Admitting: Surgical

## 2023-12-09 ENCOUNTER — Encounter: Payer: Self-pay | Admitting: Surgical

## 2023-12-09 VITALS — BP 151/94 | HR 81

## 2023-12-09 DIAGNOSIS — C50911 Malignant neoplasm of unspecified site of right female breast: Secondary | ICD-10-CM

## 2023-12-09 DIAGNOSIS — D0511 Intraductal carcinoma in situ of right breast: Secondary | ICD-10-CM

## 2023-12-09 DIAGNOSIS — Z170 Estrogen receptor positive status [ER+]: Secondary | ICD-10-CM

## 2023-12-09 MED ORDER — ONDANSETRON HCL 4 MG PO TABS: 4.0000 mg | ORAL_TABLET | Freq: Three times a day (TID) | ORAL | 0 refills | Status: DC | PRN

## 2023-12-09 MED ORDER — DIAZEPAM 2 MG PO TABS
2.0000 mg | ORAL_TABLET | Freq: Two times a day (BID) | ORAL | 0 refills | Status: DC | PRN
Start: 2023-12-09 — End: 2024-03-23

## 2023-12-09 MED ORDER — CEPHALEXIN 500 MG PO CAPS: 500.0000 mg | ORAL_CAPSULE | Freq: Four times a day (QID) | ORAL | 0 refills | Status: AC

## 2023-12-09 MED ORDER — OXYCODONE HCL 5 MG PO TABS: 5.0000 mg | ORAL_TABLET | Freq: Four times a day (QID) | ORAL | 0 refills | Status: AC | PRN

## 2023-12-09 NOTE — Progress Notes (Signed)
 Patient ID: Angelica Dougherty, female    DOB: Feb 06, 1971, 52 y.o.   MRN: [id number]  Chief Complaint  Patient presents with   Pre-op Exam      ICD-10-CM   1. Malignant neoplasm of right breast in female, estrogen receptor positive, unspecified site of breast (HCC)  C50.911    Z17.0     2. Ductal carcinoma in situ (DCIS) of right breast  D05.11       History of Present Illness: Angelica Dougherty is a 52 y.o.  female  with a history of breast cancer.  She presents for preoperative evaluation for upcoming procedure, immediate bilateral breast reconstruction with placement tissue expander and Flex HD, scheduled for 12/22/2023 with Dr.  Lowery in conjunction with bilateral mastectomy with sentinel lymph node biopsy by Dr. Marinda  The patient has not had problems with anesthesia. No history of DVT/PE.  No family history of DVT/PE.  No family or personal history of bleeding or clotting disorders.  Patient is not currently taking any blood thinners.  No history of CVA/MI.   Denies cardiac or pulmonary disease.  Summary of Previous Visit: Patient was diagnosed with breast cancer September after screening mammogram.  Calcifications in the right breast with concerning areas in the left breast.  BI-RADS 5.  Left breast biopsy consistent with invasive mammary carcinoma, right breast biopsy showed DCIS.  ER/PR positive.  Patient is otherwise in good health, not a smoker not a diabetic.  She is a double D cup.  No plans for radiation.  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  We discussed 6 weeks out of work.  PMH Significant for: DCIS of right breast, left breast invasive mammary carcinoma.  Patient reports she has been feeling well lately with no recent changes to her health.    She does report that she got a rash of her left breast after her most recent biopsy.  Unsure what caused this, possibly the surgical prep?  Past Medical  History: Allergies: Allergies  Allergen Reactions   Codeine Other (See Comments)    Swelling    Current Medications:  Current Outpatient Medications:    cephALEXin  (KEFLEX ) 500 MG capsule, Take 1 capsule (500 mg total) by mouth 4 (four) times daily for 5 days., Disp: 20 capsule, Rfl: 0   diazepam  (VALIUM ) 2 MG tablet, Take 1 tablet (2 mg total) by mouth every 12 (twelve) hours as needed for muscle spasms., Disp: 20 tablet, Rfl: 0   diphenhydrAMINE (BENADRYL) 25 MG tablet, Take 25 mg by mouth daily as needed for allergies., Disp: , Rfl:    ondansetron  (ZOFRAN ) 4 MG tablet, Take 1 tablet (4 mg total) by mouth every 8 (eight) hours as needed for nausea or vomiting., Disp: 20 tablet, Rfl: 0   oxyCODONE  (OXY IR/ROXICODONE ) 5 MG immediate release tablet, Take 1 tablet (5 mg total) by mouth every 6 (six) hours as needed for up to 5 days for severe pain (pain score 7-10)., Disp: 20 tablet, Rfl: 0  Past Medical Problems: Past Medical History:  Diagnosis Date   Allergy     Past Surgical History: Past Surgical History:  Procedure Laterality Date   ABDOMINAL HYSTERECTOMY  06/2006   Total hysterctomy excessive bleeding and fibroids per patient report. [facility]   BREAST BIOPSY Right 09/25/2023   stereo bx, calcs, RIBBON clip-path pending   BREAST BIOPSY Left 09/25/2023   US  bx, savi tag as marker, path pending   BREAST BIOPSY  Right 09/25/2023   MM RT BREAST BX W LOC DEV 1ST LESION IMAGE BX SPEC STEREO GUIDE 09/25/2023 [facility]   BREAST BIOPSY Left 09/25/2023   US  LT BREAST BX W LOC DEV 1ST LESION IMG BX SPEC US  GUIDE 09/25/2023 [facility]   BREAST EXCISIONAL BIOPSY Right    benign   BREAST LUMPECTOMY Right    benign   CESAREAN SECTION  12/16/2005    Social History: Social History   Socioeconomic History   Marital status: Single    Spouse name: Not on file   Number of children: 1   Years of education: Not on file   Highest education level: Some college, no  degree  Occupational History   Not on file  Tobacco Use   Smoking status: Never   Smokeless tobacco: Never  Vaping Use   Vaping status: Never Used  Substance and Sexual Activity   Alcohol use: No   Drug use: No   Sexual activity: Not Currently  Other Topics Concern   Not on file  Social History Narrative   Not on file   Social Drivers of Health   Financial Resource Strain: Low Risk  (08/15/2023)   Overall Financial Resource Strain (CARDIA)    Difficulty of Paying Living Expenses: Not very hard  Food Insecurity: No Food Insecurity (08/15/2023)   Hunger Vital Sign    Worried About Running Out of Food in the Last Year: Never true    Ran Out of Food in the Last Year: Never true  Transportation Needs: No Transportation Needs (08/15/2023)   PRAPARE - Administrator, Civil Service (Medical): No    Lack of Transportation (Non-Medical): No  Physical Activity: Insufficiently Active (08/15/2023)   Exercise Vital Sign    Days of Exercise per Week: 2 days    Minutes of Exercise per Session: 20 min  Stress: No Stress Concern Present (08/15/2023)   [employer] of Occupational Health - Occupational Stress Questionnaire    Feeling of Stress : Not at all  Social Connections: Moderately Integrated (08/15/2023)   Social Connection and Isolation Panel [NHANES]    Frequency of Communication with Friends and Family: More than three times a week    Frequency of Social Gatherings with Friends and Family: Once a week    Attends Religious Services: More than 4 times per year    Active Member of [employer] or Organizations: Yes    Attends Engineer, Structural: More than 4 times per year    Marital Status: Never married  Catering Manager Violence: Not on file    Family History: Family History  Problem Relation Age of Onset   Asthma Mother    Cancer Maternal Uncle        back cancer at 41   Breast cancer Paternal Aunt        dx 30s, bilateral   Breast cancer Paternal Aunt         dx 59s   Breast cancer Cousin        dx 11s    Review of Systems: Review of Systems  Constitutional: Negative.   Respiratory: Negative.    Cardiovascular: Negative.   Gastrointestinal: Negative.   Neurological: Negative.     Physical Exam: Vital Signs BP (!) 151/94 (BP Location: Left Arm, Patient Position: Sitting, Cuff Size: Normal)   Pulse 81   LMP 06/08/2006 (Approximate) Comment: Hysterectomy  SpO2 99%   Physical Exam Constitutional:      General: Not in acute distress.  Appearance: Normal appearance. Not ill-appearing.  HENT:     Head: Normocephalic and atraumatic.  Eyes:     Pupils: Pupils are equal, round Neck:     Musculoskeletal: Normal range of motion.  Cardiovascular:     Rate and Rhythm: Normal rate    Pulses: Normal pulses.  Pulmonary:     Effort: Pulmonary effort is normal. No respiratory distress.  Musculoskeletal: Normal range of motion.  Skin:    General: Skin is warm and dry.     Findings: No erythema or rash.  Neurological:     General: No focal deficit present.     Mental Status: Alert and oriented to person, place, and time. Mental status is at baseline.     Motor: No weakness.  Psychiatric:        Mood and Affect: Mood normal.        Behavior: Behavior normal.    Assessment/Plan: The patient is scheduled for immediate bilateral breast reconstruction placement of tissue expanders and Flex HD with Dr. Lowery.  Risks, benefits, and alternatives of procedure discussed, questions answered and consent obtained.    Smoking Status: Non-smoker; Counseling Given?  N/A Last Mammogram: Patient has had biopsies of bilateral breasts with left breast invasive mammary carcinoma and right breast DCIS  Caprini Score: 6; Risk Factors include: Age, BMI > 25, breast cancer and length of planned surgery. Recommendation for mechanical prophylaxis. Encourage early ambulation.   Pictures obtained: @consult   Post-op Rx sent to pharmacy: Oxycodone , Zofran ,  Keflex , Valium   Patient was provided with the breast reconstruction and General Surgical Risk consent document and Pain Medication Agreement prior to their appointment.  They had adequate time to read through the risk consent documents and Pain Medication Agreement. We also discussed them in person together during this preop appointment. All of their questions were answered to their satisfaction.  Recommended calling if they have any further questions.  Risk consent form and Pain Medication Agreement to be scanned into patient's chart.  The risks that can be encountered with and after placement of a breast expander placement were discussed and include the following but not limited to these: bleeding, infection, delayed healing, anesthesia risks, skin sensation changes, injury to structures including nerves, blood vessels, and muscles which may be temporary or permanent, allergies to tape, suture materials and glues, blood products, topical preparations or injected agents, skin contour irregularities, skin discoloration and swelling, deep vein thrombosis, cardiac and pulmonary complications, pain, which may persist, fluid accumulation, wrinkling of the skin over the expander, changes in nipple or breast sensation, expander leakage or rupture, faulty position of the expander, persistent pain, formation of tight scar tissue around the expander (capsular contracture), possible need for revisional surgery or staged procedures.   Electronically signed by: Donnice PARAS Zigmund Linse, PA-C 12/09/2023 3:05 PM

## 2023-12-09 NOTE — H&P (View-Only) (Signed)
 Patient ID: Angelica Dougherty, female    DOB: Feb 06, 1971, 52 y.o.   MRN: [id number]  Chief Complaint  Patient presents with   Pre-op Exam      ICD-10-CM   1. Malignant neoplasm of right breast in female, estrogen receptor positive, unspecified site of breast (HCC)  C50.911    Z17.0     2. Ductal carcinoma in situ (DCIS) of right breast  D05.11       History of Present Illness: Angelica Dougherty is a 52 y.o.  female  with a history of breast cancer.  She presents for preoperative evaluation for upcoming procedure, immediate bilateral breast reconstruction with placement tissue expander and Flex HD, scheduled for 12/22/2023 with Dr.  Lowery in conjunction with bilateral mastectomy with sentinel lymph node biopsy by Dr. Marinda  The patient has not had problems with anesthesia. No history of DVT/PE.  No family history of DVT/PE.  No family or personal history of bleeding or clotting disorders.  Patient is not currently taking any blood thinners.  No history of CVA/MI.   Denies cardiac or pulmonary disease.  Summary of Previous Visit: Patient was diagnosed with breast cancer September after screening mammogram.  Calcifications in the right breast with concerning areas in the left breast.  BI-RADS 5.  Left breast biopsy consistent with invasive mammary carcinoma, right breast biopsy showed DCIS.  ER/PR positive.  Patient is otherwise in good health, not a smoker not a diabetic.  She is a double D cup.  No plans for radiation.  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  We discussed 6 weeks out of work.  PMH Significant for: DCIS of right breast, left breast invasive mammary carcinoma.  Patient reports she has been feeling well lately with no recent changes to her health.    She does report that she got a rash of her left breast after her most recent biopsy.  Unsure what caused this, possibly the surgical prep?  Past Medical  History: Allergies: Allergies  Allergen Reactions   Codeine Other (See Comments)    Swelling    Current Medications:  Current Outpatient Medications:    cephALEXin  (KEFLEX ) 500 MG capsule, Take 1 capsule (500 mg total) by mouth 4 (four) times daily for 5 days., Disp: 20 capsule, Rfl: 0   diazepam  (VALIUM ) 2 MG tablet, Take 1 tablet (2 mg total) by mouth every 12 (twelve) hours as needed for muscle spasms., Disp: 20 tablet, Rfl: 0   diphenhydrAMINE (BENADRYL) 25 MG tablet, Take 25 mg by mouth daily as needed for allergies., Disp: , Rfl:    ondansetron  (ZOFRAN ) 4 MG tablet, Take 1 tablet (4 mg total) by mouth every 8 (eight) hours as needed for nausea or vomiting., Disp: 20 tablet, Rfl: 0   oxyCODONE  (OXY IR/ROXICODONE ) 5 MG immediate release tablet, Take 1 tablet (5 mg total) by mouth every 6 (six) hours as needed for up to 5 days for severe pain (pain score 7-10)., Disp: 20 tablet, Rfl: 0  Past Medical Problems: Past Medical History:  Diagnosis Date   Allergy     Past Surgical History: Past Surgical History:  Procedure Laterality Date   ABDOMINAL HYSTERECTOMY  06/2006   Total hysterctomy excessive bleeding and fibroids per patient report. [facility]   BREAST BIOPSY Right 09/25/2023   stereo bx, calcs, RIBBON clip-path pending   BREAST BIOPSY Left 09/25/2023   US  bx, savi tag as marker, path pending   BREAST BIOPSY  Right 09/25/2023   MM RT BREAST BX W LOC DEV 1ST LESION IMAGE BX SPEC STEREO GUIDE 09/25/2023 [facility]   BREAST BIOPSY Left 09/25/2023   US  LT BREAST BX W LOC DEV 1ST LESION IMG BX SPEC US  GUIDE 09/25/2023 [facility]   BREAST EXCISIONAL BIOPSY Right    benign   BREAST LUMPECTOMY Right    benign   CESAREAN SECTION  12/16/2005    Social History: Social History   Socioeconomic History   Marital status: Single    Spouse name: Not on file   Number of children: 1   Years of education: Not on file   Highest education level: Some college, no  degree  Occupational History   Not on file  Tobacco Use   Smoking status: Never   Smokeless tobacco: Never  Vaping Use   Vaping status: Never Used  Substance and Sexual Activity   Alcohol use: No   Drug use: No   Sexual activity: Not Currently  Other Topics Concern   Not on file  Social History Narrative   Not on file   Social Drivers of Health   Financial Resource Strain: Low Risk  (08/15/2023)   Overall Financial Resource Strain (CARDIA)    Difficulty of Paying Living Expenses: Not very hard  Food Insecurity: No Food Insecurity (08/15/2023)   Hunger Vital Sign    Worried About Running Out of Food in the Last Year: Never true    Ran Out of Food in the Last Year: Never true  Transportation Needs: No Transportation Needs (08/15/2023)   PRAPARE - Administrator, Civil Service (Medical): No    Lack of Transportation (Non-Medical): No  Physical Activity: Insufficiently Active (08/15/2023)   Exercise Vital Sign    Days of Exercise per Week: 2 days    Minutes of Exercise per Session: 20 min  Stress: No Stress Concern Present (08/15/2023)   [employer] of Occupational Health - Occupational Stress Questionnaire    Feeling of Stress : Not at all  Social Connections: Moderately Integrated (08/15/2023)   Social Connection and Isolation Panel [NHANES]    Frequency of Communication with Friends and Family: More than three times a week    Frequency of Social Gatherings with Friends and Family: Once a week    Attends Religious Services: More than 4 times per year    Active Member of [employer] or Organizations: Yes    Attends Engineer, Structural: More than 4 times per year    Marital Status: Never married  Catering Manager Violence: Not on file    Family History: Family History  Problem Relation Age of Onset   Asthma Mother    Cancer Maternal Uncle        back cancer at 41   Breast cancer Paternal Aunt        dx 30s, bilateral   Breast cancer Paternal Aunt         dx 59s   Breast cancer Cousin        dx 11s    Review of Systems: Review of Systems  Constitutional: Negative.   Respiratory: Negative.    Cardiovascular: Negative.   Gastrointestinal: Negative.   Neurological: Negative.     Physical Exam: Vital Signs BP (!) 151/94 (BP Location: Left Arm, Patient Position: Sitting, Cuff Size: Normal)   Pulse 81   LMP 06/08/2006 (Approximate) Comment: Hysterectomy  SpO2 99%   Physical Exam Constitutional:      General: Not in acute distress.  Appearance: Normal appearance. Not ill-appearing.  HENT:     Head: Normocephalic and atraumatic.  Eyes:     Pupils: Pupils are equal, round Neck:     Musculoskeletal: Normal range of motion.  Cardiovascular:     Rate and Rhythm: Normal rate    Pulses: Normal pulses.  Pulmonary:     Effort: Pulmonary effort is normal. No respiratory distress.  Musculoskeletal: Normal range of motion.  Skin:    General: Skin is warm and dry.     Findings: No erythema or rash.  Neurological:     General: No focal deficit present.     Mental Status: Alert and oriented to person, place, and time. Mental status is at baseline.     Motor: No weakness.  Psychiatric:        Mood and Affect: Mood normal.        Behavior: Behavior normal.    Assessment/Plan: The patient is scheduled for immediate bilateral breast reconstruction placement of tissue expanders and Flex HD with Dr. Lowery.  Risks, benefits, and alternatives of procedure discussed, questions answered and consent obtained.    Smoking Status: Non-smoker; Counseling Given?  N/A Last Mammogram: Patient has had biopsies of bilateral breasts with left breast invasive mammary carcinoma and right breast DCIS  Caprini Score: 6; Risk Factors include: Age, BMI > 25, breast cancer and length of planned surgery. Recommendation for mechanical prophylaxis. Encourage early ambulation.   Pictures obtained: @consult   Post-op Rx sent to pharmacy: Oxycodone , Zofran ,  Keflex , Valium   Patient was provided with the breast reconstruction and General Surgical Risk consent document and Pain Medication Agreement prior to their appointment.  They had adequate time to read through the risk consent documents and Pain Medication Agreement. We also discussed them in person together during this preop appointment. All of their questions were answered to their satisfaction.  Recommended calling if they have any further questions.  Risk consent form and Pain Medication Agreement to be scanned into patient's chart.  The risks that can be encountered with and after placement of a breast expander placement were discussed and include the following but not limited to these: bleeding, infection, delayed healing, anesthesia risks, skin sensation changes, injury to structures including nerves, blood vessels, and muscles which may be temporary or permanent, allergies to tape, suture materials and glues, blood products, topical preparations or injected agents, skin contour irregularities, skin discoloration and swelling, deep vein thrombosis, cardiac and pulmonary complications, pain, which may persist, fluid accumulation, wrinkling of the skin over the expander, changes in nipple or breast sensation, expander leakage or rupture, faulty position of the expander, persistent pain, formation of tight scar tissue around the expander (capsular contracture), possible need for revisional surgery or staged procedures.   Electronically signed by: Donnice PARAS Zigmund Linse, PA-C 12/09/2023 3:05 PM

## 2023-12-15 ENCOUNTER — Encounter
Admission: RE | Admit: 2023-12-15 | Discharge: 2023-12-15 | Disposition: A | Discharge status: Home / Self Care | Payer: BC Managed Care – PPO | Source: Ambulatory Visit | Attending: General Surgery | Admitting: General Surgery

## 2023-12-15 ENCOUNTER — Ambulatory Visit: Payer: Self-pay | Admitting: General Surgery

## 2023-12-15 ENCOUNTER — Telehealth: Payer: Self-pay | Admitting: Plastic Surgery

## 2023-12-15 ENCOUNTER — Other Ambulatory Visit: Payer: Self-pay

## 2023-12-15 DIAGNOSIS — C50412 Malignant neoplasm of upper-outer quadrant of left female breast: Secondary | ICD-10-CM

## 2023-12-15 HISTORY — DX: Leiomyoma of uterus, unspecified: D25.9

## 2023-12-15 HISTORY — DX: Hyperlipidemia, unspecified: E78.5

## 2023-12-15 NOTE — Telephone Encounter (Signed)
 Pt called and wants to know why we told her 2 hours early at 645, sx at 845am,  when [facility] told her to be there before her sx. She wants to make sure she is there at the right time and not be late. She stated she spoke with a Karna in pre admitting at [facility]. Please Advise, pt sx is 12-22-23, thank you

## 2023-12-15 NOTE — Patient Instructions (Addendum)
 Your procedure is scheduled on: Monday, January 13 Report to the Registration Desk on the 1st floor of the [facility] at 7:40 am. Rosine will need to go to the radiology department first. To find out your arrival time, please call [phone] between 1PM - 3PM on: Friday, January 10 If your arrival time is 6:00 am, do not arrive before that time as the [facility] entrance doors do not open until 6:00 am.  REMEMBER: Instructions that are not followed completely may result in serious medical risk, up to and including death; or upon the discretion of your surgeon and anesthesiologist your surgery may need to be rescheduled.  Do not eat or drink after midnight the night before surgery.  No gum chewing or hard candies.  One week prior to surgery: starting January 6 Stop Anti-inflammatories (NSAIDS) such as Advil, Aleve, Ibuprofen, Motrin, Naproxen, Naprosyn and Aspirin based products such as Excedrin, Goody's Powder, BC Powder. Stop ANY OVER THE COUNTER supplements until after surgery.  You may however, continue to take Tylenol  if needed for pain up until the day of surgery.  Continue taking all of your other prescription medications up until the day of surgery.  ON THE DAY OF SURGERY DO NOT TAKE ANY MEDICATIONS   No Alcohol for 24 hours before or after surgery.  No Smoking including e-cigarettes for 24 hours before surgery.  No chewable tobacco products for at least 6 hours before surgery.  No nicotine patches on the day of surgery.  Do not use any recreational drugs for at least a week (preferably 2 weeks) before your surgery.  Please be advised that the combination of cocaine and anesthesia may have negative outcomes, up to and including death. If you test positive for cocaine, your surgery will be cancelled.  On the morning of surgery brush your teeth with toothpaste and water , you may rinse your mouth with mouthwash if you wish. Do not swallow any toothpaste or mouthwash.  Use  CHG Soap as directed on instruction sheet.  Do not wear jewelry, make-up, hairpins, clips or nail polish.  For welded (permanent) jewelry: bracelets, anklets, waist bands, etc.  Please have this removed prior to surgery.  If it is not removed, there is a chance that hospital personnel will need to cut it off on the day of surgery.  Do not wear lotions, powders, or perfumes. No deodorant.   Do not shave body hair from the neck down 48 hours before surgery.  Contact lenses, hearing aids and dentures may not be worn into surgery.  Do not bring valuables to the hospital. [facility] is not responsible for any missing/lost belongings or valuables.   Notify your doctor if there is any change in your medical condition (cold, fever, infection).  Wear comfortable clothing (specific to your surgery type) to the hospital.  After surgery, you can help prevent lung complications by doing breathing exercises.  Take deep breaths and cough every 1-2 hours. Your doctor may order a device called an Incentive Spirometer to help you take deep breaths.  If you are being admitted to the hospital overnight, leave your suitcase in the car. After surgery it may be brought to your room.  In case of increased patient census, it may be necessary for you, the patient, to continue your postoperative care in the Same Day Surgery department.  If you are being discharged the day of surgery, you will not be allowed to drive home. You will need a responsible individual to drive  you home and stay with you for 24 hours after surgery.   If you are taking public transportation, you will need to have a responsible individual with you.  Please call the Pre-admissions Testing Dept. at [phone] if you have any questions about these instructions.  Surgery Visitation Policy:  Patients having surgery or a procedure may have two visitors.  Children under the age of 53 must have an adult with them who is not the  patient.  Inpatient Visitation:    Visiting hours are 7 a.m. to 8 p.m. Up to four visitors are allowed at one time in a patient room. The visitors may rotate out with other people during the day.  One visitor age 7 or older may stay with the patient overnight and must be in the room by 8 p.m.     Preparing for Surgery with CHLORHEXIDINE  GLUCONATE (CHG) Soap  Chlorhexidine  Gluconate (CHG) Soap  o An antiseptic cleaner that kills germs and bonds with the skin to continue killing germs even after washing  o Used for showering the night before surgery and morning of surgery  Before surgery, you can play an important role by reducing the number of germs on your skin.  CHG (Chlorhexidine  gluconate) soap is an antiseptic cleanser which kills germs and bonds with the skin to continue killing germs even after washing.  Please do not use if you have an allergy to CHG or antibacterial soaps. If your skin becomes reddened/irritated stop using the CHG.  1. Shower the NIGHT BEFORE SURGERY and the MORNING OF SURGERY with CHG soap.  2. If you choose to wash your hair, wash your hair first as usual with your normal shampoo.  3. After shampooing, rinse your hair and body thoroughly to remove the shampoo.  4. Use CHG as you would any other liquid soap. You can apply CHG directly to the skin and wash gently with a scrungie or a clean washcloth.  5. Apply the CHG soap to your body only from the neck down. Do not use on open wounds or open sores. Avoid contact with your eyes, ears, mouth, and genitals (private parts). Wash face and genitals (private parts) with your normal soap.  6. Wash thoroughly, paying special attention to the area where your surgery will be performed.  7. Thoroughly rinse your body with warm water .  8. Do not shower/wash with your normal soap after using and rinsing off the CHG soap.  9. Pat yourself dry with a clean towel.  10. Wear clean pajamas to bed the night before  surgery.  12. Place clean sheets on your bed the night of your first shower and do not sleep with pets.  13. Shower again with the CHG soap on the day of surgery prior to arriving at the hospital.  14. Do not apply any deodorants/lotions/powders.  15. Please wear clean clothes to the hospital.

## 2023-12-16 ENCOUNTER — Encounter: Payer: Self-pay | Admitting: General Surgery

## 2023-12-16 ENCOUNTER — Ambulatory Visit (INDEPENDENT_AMBULATORY_CARE_PROVIDER_SITE_OTHER): Payer: BC Managed Care – PPO | Admitting: General Surgery

## 2023-12-16 VITALS — BP 146/76 | HR 71 | Temp 97.9°F | Ht 73.0 in | Wt 187.0 lb

## 2023-12-16 DIAGNOSIS — Z170 Estrogen receptor positive status [ER+]: Secondary | ICD-10-CM

## 2023-12-16 DIAGNOSIS — C50412 Malignant neoplasm of upper-outer quadrant of left female breast: Secondary | ICD-10-CM | POA: Diagnosis not present

## 2023-12-16 DIAGNOSIS — C50212 Malignant neoplasm of upper-inner quadrant of left female breast: Secondary | ICD-10-CM | POA: Diagnosis not present

## 2023-12-16 NOTE — Patient Instructions (Signed)
 Breast Reconstruction With Implant or Tissue Expander Insertion  Breast reconstruction is surgery to rebuild a breast after it was removed as part of cancer treatment. Many different procedures can be used in breast reconstruction. One method involves creating a new breast with an implant or a tissue expander: An implant that is filled with silicone or salt water  (saline) may be placed under the breast flap during your first surgery. A tissue expander is an empty implant that is gradually filled with saline over a period of weeks as your skin and muscles expand. This option requires several follow-up visits. When your breast reaches the right size, the expander will be removed surgically and replaced with a regular implant. Breast reconstruction may be done at the same time as the breast removal (mastectomy), or it may be done at a later date. Breast reconstruction often requires multiple operations, even if you choose reconstruction at the time of your mastectomy. If you are receiving radiation therapy after breast surgery, your health care provider may recommend waiting until you're finished with radiation therapy. Tell a health care provider about: Any allergies you have. All medicines you are taking, including vitamins, herbs, eye drops, creams, and over-the-counter medicines. Any problems you or family members have had with anesthetic medicines. Any bleeding problems you have. Any surgeries you have had. Any medical conditions you have. Whether you are pregnant or may be pregnant. Any history of radiation therapy to your chest. What are the risks? Generally, this is a safe procedure. However, problems may occur, including: Infection. Bleeding. Allergic reactions to medicines. Scars and bruising. Loss of feeling (sensation) in your reconstructed breast or nipple. Loss of some or all of the breast tissue or breast tissue flap. Other problems that may occur specific to the implant  include: A leak, burst, or puncture to the implant. Additional surgery, either to remove or replace the implants. With certain breast implants there is a low but increased risk of developing a type of cancer known as breast implant-associated anaplastic large cell lymphoma (BIA-ALCL). This type of cancer occurs in the breast tissue, but it is not breast cancer. Breast implants can make mammograms difficult to do. The implants may affect the result of the mammogram or the evaluation of the results. What happens before the procedure? When to stop eating and drinking Follow instructions from your health care provider about what you may eat and drink before your procedure. These may include: 8 hours before the procedure Stop eating most foods. Do not eat meat, fried foods, or fatty foods. Eat only light foods, such as toast or crackers. All liquids are okay except energy drinks and alcohol. 6 hours before the procedure Stop eating. Drink only clear liquids, such as water , clear fruit juice, black coffee, plain tea, and sports drinks. Do not drink energy drinks or alcohol. 2 hours before the procedure Stop drinking all liquids. You may be allowed to take medicines with small sips of water . If you do not follow your health care provider's instructions, your procedure may be delayed or canceled. General instructions Do not use any products that contain nicotine or tobacco before the procedure. These products include cigarettes, chewing tobacco, and vaping devices, such as e-cigarettes. If you need help quitting, ask your health care provider. Ask your health care provider: How your surgical site will be marked. What steps will be taken to help prevent infection. These steps may include: Removing hair at the surgery site. Washing skin with a germ-killing soap. Receiving antibiotic medicine.  If you will be going home right after the procedure, plan to have a responsible adult: Take you home from  the hospital or clinic. You will not be allowed to drive. Care for you for the time you are told. Medicines Ask your health care provider about: Changing or stopping your regular medicines. This is especially important if you are taking diabetes medicines or blood thinners. Taking medicines such as aspirin and ibuprofen. These medicines can thin your blood. Do not take these medicines unless your health care provider tells you to take them. Taking over-the-counter medicines, vitamins, herbs, and supplements. What happens during the procedure? An IV will be inserted into one of your veins. You will be given one or more of the following: A medicine to help you relax (sedative). A medicine to make you fall asleep (general anesthetic). The surgeon will determine if the implant or tissue expander will go in front of or behind the pectoral muscle in your chest. Your surgeon may create a pocket of skin, fat, and muscle for the implant or tissue expander to go into. The implant or tissue expander will then be inserted into the pocket. For a tissue expander, your health care provider may inject some saline into the expander through a small valve under your skin so that it fills out the pocket. A thin tube (drain) may be placed in the incision to drain fluid as you heal. The health care provider will use stitches (sutures) to close the pocket and the incisions. The incision will be covered with a bandage and a pressure wrap. The procedure may vary among health care providers and hospitals. What happens after the procedure? Your blood pressure, heart rate, breathing rate, and blood oxygen level will be monitored until you leave the hospital or clinic. You will be given pain medicine as needed. Your IV can be removed when you are able to eat and drink. You may need to wear a supportive bra or undergarment as you heal. If a tissue expander was used, you will have to come back to your surgeon for periodic  fills with saline. Keep all follow-up visits. This is important. Summary In this method of breast reconstruction, a new breast may be created with an implant or with a tissue expander that is later replaced with an implant. A tissue expander is an empty implant that is gradually filled with saline over a period of weeks as your skin and muscles expand. The reconstruction procedure might be performed as part of the same procedure as a mastectomy, or it can be done at a later date. Before the procedure, follow instructions from your health care provider about eating and drinking restrictions and any changes to medicines and activities. This information is not intended to replace advice given to you by your health care provider. Make sure you discuss any questions you have with your health care provider. Document Revised: 09/26/2021 Document Reviewed: 09/26/2021 Elsevier Patient Education  2024 [employer].

## 2023-12-18 NOTE — Progress Notes (Signed)
 Patient ID: Angelica Dougherty, female   DOB: Dec 26, 1970, 53 y.o.   MRN: [id number] CC: Pre-Op for bilateral mastectomy with bilateral SLNB History of Present Illness Angelica Dougherty is a 53 y.o. female who presents in consultation for preoperative visit.  The patient was found to have bilateral breast cancer.  Due to this she has elected to undergo a bilateral mastectomy with bilateral sentinel lymph node biopsies.  She has seen our plastic and reconstructive surgery team and is aware of the plan from them.  She denies any changes to her breast including no skin changes other than some rash where there was tape at the biopsy site.  This has resolved.  She has had no further dimpling of the skin.  She has not felt any masses.  She denies any masses that are palpable in her axillas.  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 [facility]   BREAST BIOPSY Right 09/25/2023   stereo bx, calcs, RIBBON clip-path pending   BREAST BIOPSY Left 09/25/2023   US  bx, savi tag as marker, path pending   BREAST BIOPSY Right 09/25/2023   MM RT BREAST BX W LOC DEV 1ST LESION IMAGE BX SPEC STEREO GUIDE 09/25/2023 [facility]   BREAST BIOPSY Left 09/25/2023   US  LT BREAST BX W LOC DEV 1ST LESION IMG BX SPEC US  GUIDE 09/25/2023 [facility]   BREAST EXCISIONAL BIOPSY Right 1993   benign   BREAST LUMPECTOMY Right 1991   benign   CESAREAN SECTION  12/16/2005    Allergies  Allergen Reactions   Codeine Other (See Comments)    Swelling     Current Outpatient Medications  Medication Sig Dispense Refill   diazepam  (VALIUM ) 2 MG tablet Take 1 tablet (2 mg total) by mouth every 12 (twelve) hours as needed for muscle spasms. 20 tablet 0   diphenhydrAMINE (BENADRYL) 25 MG tablet Take 25 mg by mouth daily as needed for allergies.     ondansetron  (ZOFRAN ) 4 MG tablet Take 1 tablet (4 mg total) by mouth every 8 (eight) hours as needed for nausea or vomiting. 20 tablet 0   No current facility-administered medications for this visit.    Family History Family History  Problem Relation Age of Onset   Asthma Mother    Cancer Maternal Uncle        back cancer at 13   Breast cancer Paternal Aunt        dx 30s, bilateral   Breast cancer Paternal Aunt        dx 57s   Breast cancer Cousin        dx 5s       Social History Social History   Tobacco Use   Smoking status: Never   Smokeless tobacco: Never  Vaping Use   Vaping status: Never Used  Substance Use Topics   Alcohol use: No   Drug use: No        ROS Full ROS of systems performed and is otherwise negative there than what is stated in  the HPI  Physical Exam Blood pressure (!) 146/76, pulse 71, temperature 97.9 F (36.6 C), temperature source Oral, height 6' 1 (1.854 m), weight 187 lb (84.8 kg), last menstrual period 06/08/2006, SpO2 97%.  Given that I have previously examined her breast a breast exam was deferred today.  She is alert and oriented x 3, moving all extremity spontaneously, PERRLA, clear to auscultation bilaterally and regular rate and rhythm on cardiac exam Data Reviewed I have extensively reviewed her mammograms and the the biopsy clip placements.  I have also reviewed the plastic surgery team note and she will be undergoing immediate reconstruction after the bilateral mastectomies.  I have personally reviewed the patient's imaging and medical records.    Assessment    The patient and I had a long discussion about the possible treatment  options for her. Her lesions are scattered throughout her breast and my concern is that if she elected for any breast conserving therapy then this would lead to poor cosmetic outcomes and possibly poor oncologic outcomes. She now, given her multiple biopsy-proven abnormalities and invasive cancer, is electing for bilateral mastectomies. I also discussed with her that given she has elected to not undergo breast conserving therapy she would not need to have radiation. I discussed with her that we will sample the lymph nodes bilaterally with a sentinel lymph node biopsy and that this would be a turn determinant of whether she needs chemotherapy or not.   All r/b/a discussed again with the patient, proceed as planned.     Angelica Dougherty Endow 12/16/2023

## 2023-12-19 ENCOUNTER — Telehealth: Payer: Self-pay | Admitting: Family Medicine

## 2023-12-19 NOTE — Telephone Encounter (Signed)
 Pt is calling in because Kelly had filled out some FMLA paperwork back in October and [employer] is saying they never received it. Pt says she knows it was sent because she was given notification that it was. Pt wants to know can the paperwork be resent to fax number: [phone] or email: [email or web address] with the Case number: [id number]. Pt is requesting a call back regarding this matter.

## 2023-12-19 NOTE — Telephone Encounter (Signed)
 Faxed and emailed forms again to information given in message.   Spoke with patient as well to inform her of this.

## 2023-12-22 ENCOUNTER — Observation Stay
Admission: RE | Admit: 2023-12-22 | Discharge: 2023-12-23 | Disposition: A | Discharge status: Home / Self Care | Payer: BC Managed Care – PPO | Attending: General Surgery | Admitting: General Surgery

## 2023-12-22 ENCOUNTER — Other Ambulatory Visit: Payer: Self-pay

## 2023-12-22 ENCOUNTER — Encounter: Payer: Self-pay | Admitting: General Surgery

## 2023-12-22 ENCOUNTER — Ambulatory Visit
Admission: RE | Admit: 2023-12-22 | Discharge: 2023-12-22 | Disposition: A | Discharge status: Home / Self Care | Payer: BC Managed Care – PPO | Source: Ambulatory Visit | Attending: General Surgery | Admitting: General Surgery

## 2023-12-22 ENCOUNTER — Ambulatory Visit: Payer: BC Managed Care – PPO | Admitting: Anesthesiology

## 2023-12-22 ENCOUNTER — Ambulatory Visit: Payer: BC Managed Care – PPO | Admitting: Urgent Care

## 2023-12-22 ENCOUNTER — Encounter
Admission: RE | Disposition: A | Discharge status: Home / Self Care | Payer: Self-pay | Source: Home / Self Care | Attending: General Surgery

## 2023-12-22 DIAGNOSIS — Z170 Estrogen receptor positive status [ER+]: Secondary | ICD-10-CM | POA: Diagnosis not present

## 2023-12-22 DIAGNOSIS — N6022 Fibroadenosis of left breast: Secondary | ICD-10-CM | POA: Diagnosis not present

## 2023-12-22 DIAGNOSIS — C50911 Malignant neoplasm of unspecified site of right female breast: Secondary | ICD-10-CM

## 2023-12-22 DIAGNOSIS — C50412 Malignant neoplasm of upper-outer quadrant of left female breast: Secondary | ICD-10-CM | POA: Diagnosis not present

## 2023-12-22 DIAGNOSIS — Z421 Encounter for breast reconstruction following mastectomy: Secondary | ICD-10-CM | POA: Diagnosis not present

## 2023-12-22 DIAGNOSIS — C779 Secondary and unspecified malignant neoplasm of lymph node, unspecified: Secondary | ICD-10-CM | POA: Diagnosis not present

## 2023-12-22 DIAGNOSIS — N6012 Diffuse cystic mastopathy of left breast: Secondary | ICD-10-CM | POA: Diagnosis not present

## 2023-12-22 DIAGNOSIS — C50912 Malignant neoplasm of unspecified site of left female breast: Secondary | ICD-10-CM | POA: Insufficient documentation

## 2023-12-22 DIAGNOSIS — Z719 Counseling, unspecified: Secondary | ICD-10-CM

## 2023-12-22 DIAGNOSIS — C50511 Malignant neoplasm of lower-outer quadrant of right female breast: Secondary | ICD-10-CM | POA: Diagnosis not present

## 2023-12-22 DIAGNOSIS — Z9013 Acquired absence of bilateral breasts and nipples: Principal | ICD-10-CM

## 2023-12-22 HISTORY — PX: BREAST RECONSTRUCTION WITH PLACEMENT OF TISSUE EXPANDER AND FLEX HD (ACELLULAR HYDRATED DERMIS): SHX6295

## 2023-12-22 HISTORY — PX: MASTECTOMY W/ SENTINEL NODE BIOPSY: SHX2001

## 2023-12-22 SURGERY — MASTECTOMY WITH SENTINEL LYMPH NODE BIOPSY
Anesthesia: General | Laterality: Bilateral

## 2023-12-22 MED ORDER — ISOSULFAN BLUE 1 % ~~LOC~~ SOLN
SUBCUTANEOUS | Status: AC
  Filled 2023-12-22: qty 5

## 2023-12-22 MED ORDER — CEFAZOLIN SODIUM-DEXTROSE 2-4 GM/100ML-% IV SOLN
INTRAVENOUS | Status: AC
  Filled 2023-12-22: qty 100

## 2023-12-22 MED ORDER — SODIUM CHLORIDE 0.9 % IR SOLN
Status: DC | PRN
  Administered 2023-12-22: 1000 mL

## 2023-12-22 MED ORDER — GLYCOPYRROLATE 0.2 MG/ML IJ SOLN
INTRAMUSCULAR | Status: DC | PRN
  Administered 2023-12-22: .2 mg via INTRAVENOUS

## 2023-12-22 MED ORDER — ACETAMINOPHEN 10 MG/ML IV SOLN
INTRAVENOUS | Status: DC | PRN
  Administered 2023-12-22: 1000 mg via INTRAVENOUS

## 2023-12-22 MED ORDER — TECHNETIUM TC 99M TILMANOCEPT KIT
1.0000 | PACK | Freq: Once | INTRAVENOUS | Status: AC | PRN
  Administered 2023-12-22: 1.11 via INTRADERMAL

## 2023-12-22 MED ORDER — TECHNETIUM TC 99M TILMANOCEPT KIT
1.0000 | PACK | Freq: Once | INTRAVENOUS | Status: AC | PRN
  Administered 2023-12-22: 1.08 via INTRADERMAL

## 2023-12-22 MED ORDER — SUCCINYLCHOLINE CHLORIDE 200 MG/10ML IV SOSY
PREFILLED_SYRINGE | INTRAVENOUS | Status: DC | PRN
  Administered 2023-12-22: 100 mg via INTRAVENOUS

## 2023-12-22 MED ORDER — PROPOFOL 10 MG/ML IV BOLUS
INTRAVENOUS | Status: DC | PRN
  Administered 2023-12-22: 200 mg via INTRAVENOUS

## 2023-12-22 MED ORDER — FENTANYL CITRATE (PF) 100 MCG/2ML IJ SOLN
INTRAMUSCULAR | Status: AC
  Filled 2023-12-22: qty 2

## 2023-12-22 MED ORDER — HYDROMORPHONE HCL 1 MG/ML IJ SOLN
INTRAMUSCULAR | Status: AC
  Filled 2023-12-22: qty 1

## 2023-12-22 MED ORDER — ORAL CARE MOUTH RINSE: 15.0000 mL | Freq: Once | OROMUCOSAL | Status: DC

## 2023-12-22 MED ORDER — LIDOCAINE HCL (CARDIAC) PF 100 MG/5ML IV SOSY
PREFILLED_SYRINGE | INTRAVENOUS | Status: DC | PRN
  Administered 2023-12-22: 100 mg via INTRAVENOUS

## 2023-12-22 MED ORDER — CEFAZOLIN SODIUM-DEXTROSE 2-4 GM/100ML-% IV SOLN
2.0000 g | INTRAVENOUS | Status: AC
  Administered 2023-12-22 (×2): 2 g via INTRAVENOUS

## 2023-12-22 MED ORDER — CHLORHEXIDINE GLUCONATE CLOTH 2 % EX PADS: 6.0000 | MEDICATED_PAD | Freq: Once | CUTANEOUS | Status: DC

## 2023-12-22 MED ORDER — EPHEDRINE SULFATE-NACL 50-0.9 MG/10ML-% IV SOSY
PREFILLED_SYRINGE | INTRAVENOUS | Status: DC | PRN
  Administered 2023-12-22: 5 mg via INTRAVENOUS

## 2023-12-22 MED ORDER — STERILE WATER FOR IRRIGATION IR SOLN
Status: DC | PRN
  Administered 2023-12-22: 500 mL

## 2023-12-22 MED ORDER — ALBUMIN HUMAN 5 % IV SOLN: INTRAVENOUS | Status: DC | PRN

## 2023-12-22 MED ORDER — KETAMINE HCL 50 MG/5ML IJ SOSY
PREFILLED_SYRINGE | INTRAMUSCULAR | Status: AC
  Filled 2023-12-22: qty 5

## 2023-12-22 MED ORDER — LACTATED RINGERS IV SOLN: INTRAVENOUS | Status: DC

## 2023-12-22 MED ORDER — ONDANSETRON HCL 4 MG/2ML IJ SOLN
4.0000 mg | INTRAMUSCULAR | Status: DC | PRN
  Administered 2023-12-22: 4 mg via INTRAVENOUS

## 2023-12-22 MED ORDER — FENTANYL CITRATE (PF) 100 MCG/2ML IJ SOLN: 25.0000 ug | INTRAMUSCULAR | Status: DC | PRN

## 2023-12-22 MED ORDER — ONDANSETRON HCL 4 MG/2ML IJ SOLN
INTRAMUSCULAR | Status: AC
  Filled 2023-12-22: qty 2

## 2023-12-22 MED ORDER — CEFAZOLIN SODIUM-DEXTROSE 2-4 GM/100ML-% IV SOLN
2.0000 g | Freq: Three times a day (TID) | INTRAVENOUS | Status: DC
  Administered 2023-12-22 – 2023-12-23 (×2): 2 g via INTRAVENOUS

## 2023-12-22 MED ORDER — DEXAMETHASONE SODIUM PHOSPHATE 10 MG/ML IJ SOLN
INTRAMUSCULAR | Status: DC | PRN
  Administered 2023-12-22: 10 mg via INTRAVENOUS

## 2023-12-22 MED ORDER — KETAMINE HCL 10 MG/ML IJ SOLN
INTRAMUSCULAR | Status: DC | PRN
  Administered 2023-12-22: 20 mg via INTRAVENOUS

## 2023-12-22 MED ORDER — ALBUMIN HUMAN 5 % IV SOLN
INTRAVENOUS | Status: AC
  Filled 2023-12-22: qty 250

## 2023-12-22 MED ORDER — NEOMYCIN-POLYMYXIN B GU 40-200000 IR SOLN
Status: AC
  Filled 2023-12-22: qty 2

## 2023-12-22 MED ORDER — ONDANSETRON HCL 4 MG/2ML IJ SOLN
INTRAMUSCULAR | Status: DC | PRN
  Administered 2023-12-22 (×2): 4 mg via INTRAVENOUS

## 2023-12-22 MED ORDER — PHENYLEPHRINE HCL-NACL 20-0.9 MG/250ML-% IV SOLN
INTRAVENOUS | Status: DC | PRN
  Administered 2023-12-22: 25 ug/min via INTRAVENOUS

## 2023-12-22 MED ORDER — DROPERIDOL 2.5 MG/ML IJ SOLN
INTRAMUSCULAR | Status: AC
  Filled 2023-12-22: qty 2

## 2023-12-22 MED ORDER — CHLORHEXIDINE GLUCONATE 0.12 % MT SOLN: 15.0000 mL | Freq: Once | OROMUCOSAL | Status: DC

## 2023-12-22 MED ORDER — MIDAZOLAM HCL 2 MG/2ML IJ SOLN
INTRAMUSCULAR | Status: AC
Start: 2023-12-22 — End: ?
  Filled 2023-12-22: qty 2

## 2023-12-22 MED ORDER — DROPERIDOL 2.5 MG/ML IJ SOLN
0.6250 mg | Freq: Once | INTRAMUSCULAR | Status: AC | PRN
  Administered 2023-12-22: 0.625 mg via INTRAVENOUS

## 2023-12-22 MED ORDER — CHLORHEXIDINE GLUCONATE 0.12 % MT SOLN
OROMUCOSAL | Status: AC
  Filled 2023-12-22: qty 15

## 2023-12-22 MED ORDER — OXYCODONE HCL 5 MG PO TABS
5.0000 mg | ORAL_TABLET | ORAL | Status: DC | PRN
Start: 2023-12-22 — End: 2023-12-23

## 2023-12-22 MED ORDER — HYDROMORPHONE HCL 1 MG/ML IJ SOLN: 0.5000 mg | INTRAMUSCULAR | Status: DC | PRN

## 2023-12-22 MED ORDER — SUGAMMADEX SODIUM 200 MG/2ML IV SOLN
INTRAVENOUS | Status: DC | PRN
  Administered 2023-12-22: 200 mg via INTRAVENOUS

## 2023-12-22 MED ORDER — ROCURONIUM BROMIDE 100 MG/10ML IV SOLN
INTRAVENOUS | Status: DC | PRN
  Administered 2023-12-22: 50 mg via INTRAVENOUS
  Administered 2023-12-22 (×2): 40 mg via INTRAVENOUS
  Administered 2023-12-22: 30 mg via INTRAVENOUS
  Administered 2023-12-22: 10 mg via INTRAVENOUS

## 2023-12-22 MED ORDER — BUPIVACAINE-EPINEPHRINE (PF) 0.25% -1:200000 IJ SOLN
INTRAMUSCULAR | Status: AC
  Filled 2023-12-22: qty 30

## 2023-12-22 MED ORDER — ISOSULFAN BLUE 1 % ~~LOC~~ SOLN
SUBCUTANEOUS | Status: DC | PRN
  Administered 2023-12-22: 10 mL via SUBCUTANEOUS

## 2023-12-22 MED ORDER — ACETAMINOPHEN 500 MG PO TABS
1000.0000 mg | ORAL_TABLET | Freq: Four times a day (QID) | ORAL | Status: DC
  Administered 2023-12-23 (×3): 1000 mg via ORAL

## 2023-12-22 MED ORDER — VASHE WOUND IRRIGATION OPTIME
TOPICAL | Status: DC | PRN
  Administered 2023-12-22: 68 [oz_av] via TOPICAL

## 2023-12-22 MED ORDER — OXYCODONE HCL 5 MG PO TABS
5.0000 mg | ORAL_TABLET | Freq: Once | ORAL | Status: AC
  Administered 2023-12-22: 5 mg via ORAL

## 2023-12-22 MED ORDER — DIAZEPAM 2 MG PO TABS: 2.0000 mg | ORAL_TABLET | Freq: Two times a day (BID) | ORAL | Status: DC | PRN

## 2023-12-22 MED ORDER — HYDROMORPHONE HCL 1 MG/ML IJ SOLN
INTRAMUSCULAR | Status: DC | PRN
  Administered 2023-12-22: 1 mg via INTRAVENOUS

## 2023-12-22 MED ORDER — BUPIVACAINE-EPINEPHRINE (PF) 0.5% -1:200000 IJ SOLN
INTRAMUSCULAR | Status: AC
  Filled 2023-12-22: qty 20

## 2023-12-22 MED ORDER — BUPIVACAINE-EPINEPHRINE (PF) 0.5% -1:200000 IJ SOLN
INTRAMUSCULAR | Status: DC | PRN
  Administered 2023-12-22 (×2): 20 mL

## 2023-12-22 MED ORDER — MIDAZOLAM HCL 2 MG/2ML IJ SOLN
INTRAMUSCULAR | Status: DC | PRN
  Administered 2023-12-22: 2 mg via INTRAVENOUS

## 2023-12-22 MED ORDER — OXYCODONE HCL 5 MG PO TABS
ORAL_TABLET | ORAL | Status: AC
  Filled 2023-12-22: qty 1

## 2023-12-22 MED ORDER — FENTANYL CITRATE (PF) 100 MCG/2ML IJ SOLN
INTRAMUSCULAR | Status: DC | PRN
  Administered 2023-12-22 (×4): 50 ug via INTRAVENOUS
  Administered 2023-12-22: 100 ug via INTRAVENOUS

## 2023-12-22 MED ORDER — SODIUM CHLORIDE FLUSH 0.9 % IV SOLN
INTRAVENOUS | Status: AC
  Filled 2023-12-22: qty 60

## 2023-12-22 MED ORDER — ONDANSETRON HCL 4 MG PO TABS: 4.0000 mg | ORAL_TABLET | Freq: Three times a day (TID) | ORAL | Status: DC | PRN

## 2023-12-22 MED ORDER — ACETAMINOPHEN 10 MG/ML IV SOLN
INTRAVENOUS | Status: AC
  Filled 2023-12-22: qty 100

## 2023-12-22 MED ORDER — PHENYLEPHRINE 80 MCG/ML (10ML) SYRINGE FOR IV PUSH (FOR BLOOD PRESSURE SUPPORT)
PREFILLED_SYRINGE | INTRAVENOUS | Status: DC | PRN
  Administered 2023-12-22 (×4): 80 ug via INTRAVENOUS
  Administered 2023-12-22: 160 ug via INTRAVENOUS

## 2023-12-22 SURGICAL SUPPLY — 73 items
APPLIER CLIP 11 MED OPEN (CLIP) IMPLANT
BAG DECANTER FOR FLEXI CONT (MISCELLANEOUS) ×1 IMPLANT
BINDER BREAST XLRG (GAUZE/BANDAGES/DRESSINGS) IMPLANT
BIOPATCH WHT 1IN DISK W/4.0 H (GAUZE/BANDAGES/DRESSINGS) ×2 IMPLANT
BLADE BOVIE TIP EXT 4 (BLADE) ×1 IMPLANT
BLADE SURG 15 STRL LF DISP TIS (BLADE) ×1 IMPLANT
BNDG GAUZE DERMACEA FLUFF 4 (GAUZE/BANDAGES/DRESSINGS) ×2 IMPLANT
CHLORAPREP W/TINT 26 (MISCELLANEOUS) ×1 IMPLANT
CLEANSER WND VASHE 34 (WOUND CARE) ×1 IMPLANT
CLIP APPLIE 11 MED OPEN (CLIP) IMPLANT
COVER PROBE GAMMA FINDER SLV (MISCELLANEOUS) ×1 IMPLANT
DERMABOND ADVANCED .7 DNX12 (GAUZE/BANDAGES/DRESSINGS) ×4 IMPLANT
DRAIN CHANNEL 19F RND (DRAIN) IMPLANT
DRAIN CHANNEL JP 15F RND 3/16 (MISCELLANEOUS) IMPLANT
DRAIN CHANNEL JP 19F RND 3/16 (MISCELLANEOUS) IMPLANT
DRAPE LAPAROTOMY TRNSV 106X77 (MISCELLANEOUS) ×1 IMPLANT
DRSG GAUZE FLUFF 36X18 (GAUZE/BANDAGES/DRESSINGS) ×1 IMPLANT
DRSG MEPILEX FLEX 3X3 (GAUZE/BANDAGES/DRESSINGS) IMPLANT
DRSG MEPILEX FLEX 6X6 (GAUZE/BANDAGES/DRESSINGS) ×2 IMPLANT
DRSG TEGADERM 2-3/8X2-3/4 SM (GAUZE/BANDAGES/DRESSINGS) ×2 IMPLANT
ELECT CAUTERY BLADE 6.4 (BLADE) ×2 IMPLANT
ELECT CAUTERY BLADE TIP 2.5 (TIP) ×1 IMPLANT
ELECT REM PT RETURN 9FT ADLT (ELECTROSURGICAL) ×1 IMPLANT
ELECTRODE CAUTERY BLDE TIP 2.5 (TIP) ×1 IMPLANT
ELECTRODE REM PT RTRN 9FT ADLT (ELECTROSURGICAL) ×1 IMPLANT
EVACUATOR SILICONE 100CC (DRAIN) IMPLANT
GAUZE 4X4 16PLY ~~LOC~~+RFID DBL (SPONGE) IMPLANT
GLOVE BIO SURGEON STRL SZ 6.5 (GLOVE) ×4 IMPLANT
GLOVE BIOGEL PI IND STRL 7.5 (GLOVE) ×1 IMPLANT
GLOVE SURG SYN 7.0 (GLOVE) ×1 IMPLANT
GLOVE SURG SYN 7.0 PF PI (GLOVE) ×1 IMPLANT
GOWN STRL REUS W/ TWL LRG LVL3 (GOWN DISPOSABLE) ×6 IMPLANT
GRAFT FLEX HD 19X22X0.7-1.4 (Tissue) IMPLANT
IMPL EXPANDER BREAST 535CC (Breast) IMPLANT
IMPLANT EXPANDER BREAST 535CC (Breast) ×2 IMPLANT
IV NS 1000ML BAXH (IV SOLUTION) IMPLANT
IV NS 500ML BAXH (IV SOLUTION) IMPLANT
KIT FILL ASEPTIC TRANSFER (MISCELLANEOUS) ×1 IMPLANT
KIT TURNOVER KIT A (KITS) ×1 IMPLANT
LABEL OR SOLS (LABEL) ×1 IMPLANT
MANIFOLD NEPTUNE II (INSTRUMENTS) ×2 IMPLANT
NDL FILTER BLUNT 18X1 1/2 (NEEDLE) ×3 IMPLANT
NDL HYPO 22X1.5 SAFETY MO (MISCELLANEOUS) ×2 IMPLANT
NDL SAFETY ECLIPSE 18X1.5 (NEEDLE) ×1 IMPLANT
NEEDLE FILTER BLUNT 18X1 1/2 (NEEDLE) ×3 IMPLANT
NEEDLE HYPO 22X1.5 SAFETY MO (MISCELLANEOUS) ×2 IMPLANT
PACK BASIN MAJOR ARMC (MISCELLANEOUS) ×2 IMPLANT
PACK UNIVERSAL (MISCELLANEOUS) IMPLANT
PAD ABD DERMACEA PRESS 5X9 (GAUZE/BANDAGES/DRESSINGS) ×4 IMPLANT
PIN SAFETY STRL (MISCELLANEOUS) ×1 IMPLANT
SOL PREP PVP 2OZ (MISCELLANEOUS) ×2 IMPLANT
SOLUTION PREP PVP 2OZ (MISCELLANEOUS) ×2 IMPLANT
SPIKE FLUID TRANSFER (MISCELLANEOUS) IMPLANT
SPONGE DRAIN TRACH 4X4 STRL 2S (GAUZE/BANDAGES/DRESSINGS) IMPLANT
SPONGE T-LAP 18X18 ~~LOC~~+RFID (SPONGE) ×2 IMPLANT
STRIP SUTURE WOUND CLOSURE 1/2 (MISCELLANEOUS) ×2 IMPLANT
SUT ETH BLK MONO 3 0 FS 1 12/B (SUTURE) IMPLANT
SUT MNCRL 3-0 UNDYED SH (SUTURE) ×2 IMPLANT
SUT MNCRL 4-0 27 PS-2 XMFL (SUTURE) ×4 IMPLANT
SUT MNCRL 4-0 27XMFL (SUTURE) ×4
SUT MNCRL+ 5-0 UNDYED PC-3 (SUTURE) ×2 IMPLANT
SUT PDS II 3-0 (SUTURE) IMPLANT
SUT PDS PLUS AB 2-0 CT-1 (SUTURE) ×6 IMPLANT
SUT SILK 2 0 SH (SUTURE) IMPLANT
SUT SILK 4 0 SH (SUTURE) ×2 IMPLANT
SUT VIC AB 3-0 SH 27X BRD (SUTURE) IMPLANT
SUT VICRYL 3-0 CR8 SH (SUTURE) ×1 IMPLANT
SUTURE MNCRL 4-0 27XMF (SUTURE) ×3 IMPLANT
SYR 10ML LL (SYRINGE) ×3 IMPLANT
SYR BULB IRRIG 60ML STRL (SYRINGE) ×1 IMPLANT
TOWEL OR 17X26 4PK STRL BLUE (TOWEL DISPOSABLE) ×1 IMPLANT
TRAP FLUID SMOKE EVACUATOR (MISCELLANEOUS) ×2 IMPLANT
WATER STERILE IRR 500ML POUR (IV SOLUTION) ×2 IMPLANT

## 2023-12-22 NOTE — Discharge Instructions (Signed)

## 2023-12-22 NOTE — Anesthesia Preprocedure Evaluation (Signed)
 Anesthesia Evaluation  Patient identified by MRN, date of birth, ID band Patient awake    Reviewed: Allergy & Precautions, H&P , NPO status , Patient's Chart, lab work & pertinent test results, reviewed documented beta blocker date and time   History of Anesthesia Complications Negative for: history of anesthetic complications  Airway Mallampati: III  TM Distance: >3 FB Neck ROM: full    Dental  (+) Dental Advidsory Given, Teeth Intact   Pulmonary neg pulmonary ROS   Pulmonary exam normal breath sounds clear to auscultation       Cardiovascular Exercise Tolerance: Good negative cardio ROS Normal cardiovascular exam Rhythm:regular Rate:Normal     Neuro/Psych negative neurological ROS  negative psych ROS   GI/Hepatic negative GI ROS, Neg liver ROS,,,  Endo/Other  negative endocrine ROS    Renal/GU negative Renal ROS  negative genitourinary   Musculoskeletal   Abdominal   Peds  Hematology negative hematology ROS (+)   Anesthesia Other Findings Past Medical History: No date: Allergy 09/2023: Ductal carcinoma in situ (DCIS) of right breast No date: Fibroid uterus No date: Hyperlipidemia 09/2023: Malignant neoplasm of left breast in female, estrogen  receptor positive (HCC) 09/2023: Malignant neoplasm of right breast in female, estrogen  receptor positive (HCC)   Reproductive/Obstetrics negative OB ROS                             Anesthesia Physical Anesthesia Plan  ASA: 2  Anesthesia Plan: General   Post-op Pain Management:    Induction: Intravenous  PONV Risk Score and Plan: 3 and Ondansetron , Dexamethasone , Midazolam  and Treatment may vary due to age or medical condition  Airway Management Planned: LMA and Oral ETT  Additional Equipment:   Intra-op Plan:   Post-operative Plan: Extubation in OR  Informed Consent: I have reviewed the patients History and Physical, chart,  labs and discussed the procedure including the risks, benefits and alternatives for the proposed anesthesia with the patient or authorized representative who has indicated his/her understanding and acceptance.     Dental Advisory Given  Plan Discussed with: Anesthesiologist, CRNA and Surgeon  Anesthesia Plan Comments:        Anesthesia Quick Evaluation

## 2023-12-22 NOTE — Anesthesia Procedure Notes (Signed)
 Procedure Name: Intubation Date/Time: 12/22/2023 9:17 AM  Performed by: Ledora Duncan, CRNAPre-anesthesia Checklist: Patient identified, Emergency Drugs available, Suction available and Patient being monitored Patient Re-evaluated:Patient Re-evaluated prior to induction Oxygen Delivery Method: Circle system utilized Preoxygenation: Pre-oxygenation with 100% oxygen Induction Type: IV induction Ventilation: Mask ventilation without difficulty Laryngoscope Size: McGrath and 3 Grade View: Grade I Tube type: Oral Number of attempts: 1 Airway Equipment and Method: Stylet Placement Confirmation: ETT inserted through vocal cords under direct vision, positive ETCO2 and breath sounds checked- equal and bilateral Secured at: 21 cm Tube secured with: Tape Dental Injury: Teeth and Oropharynx as per pre-operative assessment

## 2023-12-22 NOTE — Interval H&P Note (Signed)
 History and Physical Interval Note:  12/22/2023 8:54 AM  Angelica Dougherty  has presented today for surgery, with the diagnosis of Malignant neoplasm of upper-outer quadrant of left breast in female, estrogen receptor positive.  The various methods of treatment have been discussed with the patient and family. After consideration of risks, benefits and other options for treatment, the patient has consented to  Procedure(s): MASTECTOMY WITH SENTINEL LYMPH NODE BIOPSY, simple mastectomy (Bilateral) BREAST RECONSTRUCTION WITH PLACEMENT OF TISSUE EXPANDER AND FLEX HD (ACELLULAR HYDRATED DERMIS) (Bilateral) as a surgical intervention.  The patient's history has been reviewed, patient examined, no change in status, stable for surgery.  I have reviewed the patient's chart and labs.  Questions were answered to the patient's satisfaction.     [name] Lavita Pontius

## 2023-12-22 NOTE — Op Note (Signed)
 Op report    DATE OF OPERATION:  12/22/2023  LOCATION: [facility]  SURGICAL DIVISION: Plastic Surgery  PREOPERATIVE DIAGNOSES:  Bilateral Breast cancer.    POSTOPERATIVE DIAGNOSES:  1. Bilateral Breast cancer.   PROCEDURE:  1. Bilateral immediate breast reconstruction with placement of Acellular Dermal Matrix and tissue expanders.  SURGEON: Estefana Fritter, DO  ASSISTANT: Donnice Edelson, PA  ANESTHESIA:  General.   COMPLICATIONS: None.   IMPLANTS: Left - Mentor 535 cc. Ref #[id number], 250 cc of injectable saline placed in the expander. Right - Mentor 535 cc. Ref #[id number], 250 cc of injectable saline placed in the expander. Acellular Dermal Matrix 19 x 22 cm Flex HD x 2  INDICATIONS FOR PROCEDURE:  The patient, Angelica Dougherty, is a 53 y.o. female born on 01-07-1971, is here for immediate first stage breast reconstruction with placement of bilateral tissue expander and Acellular dermal matrix. MRN: [id number]  CONSENT:  Informed consent was obtained directly from the patient. Risks, benefits and alternatives were fully discussed. Specific risks including but not limited to bleeding, infection, hematoma, seroma, scarring, pain, implant infection, implant extrusion, capsular contracture, asymmetry, wound healing problems, and need for further surgery were all discussed. The patient did have an ample opportunity to have her questions answered to her satisfaction.   DESCRIPTION OF PROCEDURE:  The patient was taken to the operating room by the general surgery team. SCDs were placed and IV antibiotics were given. The patient's chest was prepped and draped in a sterile fashion. A time out was performed and the implants to be used were identified.  bilateral mastectomies were performed.  Once the general surgery team had completed their portion of the case the patient was rendered to the plastic and reconstructive surgery team.  Left:  The pocket was irrigated  with Vashe and hemostasis was achieved with electrocautery.  The ADM was then prepared according to the manufacture guidelines.  The ADM was then sutured to the outline of the breast on top of the pectoralis major muscle using 2-0 PDS at three points (12, 3 and 9 o'clock)  The 3-0 PDS was the  used to suture the ADM to the muscle leaving the inferior portion open.   The expander was prepared according to the manufacture guidelines, the air evacuated and then it was placed under the ADM and sutured in place with the 2-0 PDS at the tabs.  The inferior portion of the ADM at the inframammary fold was sutured in place with a cuff. Two drains were placed and secured to the skin with 4-0 Silk.   The deep layers were closed with 3-0 PDS followed by 3-0 Monocryl. Dermabond was applied.    Right:  The pocket was irrigated with Vashe and hemostasis was achieved with electrocautery.  The ADM was then prepared according to the manufacture guidelines.  The ADM was then sutured to the outline of the breast on top of the pectoralis major muscle using 2-0 PDS at three points (12, 3 and 9 o'clock)  The 3-0 PDS was the  used to suture the ADM to the muscle leaving the inferior portion open.   The expander was prepared according to the manufacture guidelines, the air evacuated and then it was placed under the ADM and sutured in place with the 2-0 PDS at the tabs.  The inferior portion of the ADM at the inframammary fold was sutured in place with a cuff. Two drains were placed and secured to the skin with 4-0 Silk.  The deep layers were closed with 3-0 PDS followed by 3-0 Monocryl. Dermabond was applied.    The ABDs and breast binder were placed.  The patient tolerated the procedure well and there were no complications.  The patient was allowed to wake from anesthesia and taken to the recovery room in satisfactory condition.   The advanced practice practitioner (APP) assisted throughout the case.  The APP was essential in  retraction and counter traction when needed to make the case progress smoothly.  This retraction and assistance made it possible to see the tissue plans for the procedure.  The assistance was needed for blood control, tissue re-approximation and assisted with closure of the incision site.

## 2023-12-22 NOTE — H&P (Signed)
 No changes to H and P, proceed with surgery as planned.   Patient ID: Angelica Dougherty, female   DOB: August 01, 1971, 53 y.o.   MRN: [id number] CC: Pre-Op for bilateral mastectomy with bilateral SLNB History of Present Illness Angelica Dougherty is a 53 y.o. female who presents in consultation for preoperative visit.  The patient was found to have bilateral breast cancer.  Due to this she has elected to undergo a bilateral mastectomy with bilateral sentinel lymph node biopsies.  She has seen our plastic and reconstructive surgery team and is aware of the plan from them.  She denies any changes to her breast including no skin changes other than some rash where there was tape at the biopsy site.  This has resolved.  She has had no further dimpling of the skin.  She has not felt any masses.  She denies any masses that are palpable in her axillas.   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 [facility]   BREAST BIOPSY Right 09/25/2023    stereo bx, calcs, RIBBON clip-path pending   BREAST BIOPSY Left 09/25/2023    US  bx, savi tag as marker, path pending   BREAST BIOPSY Right 09/25/2023    MM RT BREAST BX W LOC DEV 1ST LESION IMAGE BX SPEC STEREO GUIDE 09/25/2023 [facility]   BREAST BIOPSY Left 09/25/2023    US  LT BREAST BX W LOC DEV 1ST LESION IMG BX SPEC US  GUIDE 09/25/2023 [facility]   BREAST EXCISIONAL BIOPSY Right 1993    benign   BREAST LUMPECTOMY Right 1991    benign   CESAREAN SECTION    12/16/2005          Allergies       Allergies  Allergen Reactions   Codeine Other (See Comments)      Swelling              Current Outpatient Medications  Medication Sig Dispense Refill   diazepam  (VALIUM ) 2 MG tablet Take 1 tablet (2 mg total) by mouth every 12 (twelve) hours as needed for muscle spasms. 20 tablet 0   diphenhydrAMINE (BENADRYL) 25 MG tablet Take 25 mg by mouth daily as needed for allergies.       ondansetron  (ZOFRAN ) 4 MG tablet Take 1 tablet (4 mg total) by mouth every 8 (eight) hours as needed for nausea or vomiting. 20 tablet 0      No current facility-administered medications for this visit.        Family History      Family History  Problem Relation Age of Onset   Asthma Mother     Cancer Maternal Uncle          back cancer at 103   Breast cancer Paternal Aunt          dx 30s, bilateral   Breast cancer Paternal Aunt  dx 2s   Breast cancer Cousin          dx 64s            Social History Social History  Social History        Tobacco Use   Smoking status: Never   Smokeless tobacco: Never  Vaping Use   Vaping status: Never Used  Substance Use Topics   Alcohol use: No   Drug use: No            ROS Full ROS of systems performed and is otherwise negative there than what is stated in the HPI   Physical Exam Blood pressure (!) 146/76, pulse 71, temperature 97.9 F (36.6 C), temperature source Oral, height 6' 1 (1.854 m), weight 187 lb (84.8 kg), last menstrual period 06/08/2006, SpO2 97%.   Given that I have previously examined her breast a breast exam was deferred today.  She is alert and oriented x 3, moving all extremity spontaneously, PERRLA, clear to auscultation bilaterally and regular rate and rhythm on cardiac exam Data Reviewed I have extensively reviewed her mammograms and the the biopsy clip placements.  I have also reviewed the plastic surgery team note and she will be undergoing immediate reconstruction after  the bilateral mastectomies.   I have personally reviewed the patient's imaging and medical records.     Assessment Assessment The patient and I had a long discussion about the possible treatment options for her. Her lesions are scattered throughout her breast and my concern is that if she elected for any breast conserving therapy then this would lead to poor cosmetic outcomes and possibly poor oncologic outcomes. She now, given her multiple biopsy-proven abnormalities and invasive cancer, is electing for bilateral mastectomies. I also discussed with her that given she has elected to not undergo breast conserving therapy she would not need to have radiation. I discussed with her that we will sample the lymph nodes bilaterally with a sentinel lymph node biopsy and that this would be a turn determinant of whether she needs chemotherapy or not.    All r/b/a discussed again with the patient, proceed as planned.

## 2023-12-22 NOTE — Transfer of Care (Signed)
 Immediate Anesthesia Transfer of Care Note  Patient: Angelica Dougherty  Procedure(s) Performed: MASTECTOMY WITH SENTINEL LYMPH NODE BIOPSY, simple mastectomy (Bilateral) BREAST RECONSTRUCTION WITH PLACEMENT OF TISSUE EXPANDER AND FLEX HD (ACELLULAR HYDRATED DERMIS) (Bilateral)  Patient Location: PACU  Anesthesia Type:General  Level of Consciousness: awake, drowsy, and patient cooperative  Airway & Oxygen Therapy: Patient Spontanous Breathing and Patient connected to face mask oxygen  Post-op Assessment: Report given to RN and Post -op Vital signs reviewed and stable  Post vital signs: Reviewed and stable  Last Vitals:  Vitals Value Taken Time  BP 126/66 12/22/23 1447  Temp    Pulse 92 12/22/23 1456  Resp 16 12/22/23 1456  SpO2 99 % 12/22/23 1456  Vitals shown include unfiled device data.  Last Pain:  Vitals:   12/22/23 0846  PainSc: 0-No pain         Complications: No notable events documented.

## 2023-12-23 ENCOUNTER — Observation Stay: Payer: BC Managed Care – PPO

## 2023-12-23 ENCOUNTER — Encounter: Payer: Self-pay | Admitting: General Surgery

## 2023-12-23 DIAGNOSIS — Z170 Estrogen receptor positive status [ER+]: Secondary | ICD-10-CM | POA: Diagnosis not present

## 2023-12-23 DIAGNOSIS — C50511 Malignant neoplasm of lower-outer quadrant of right female breast: Secondary | ICD-10-CM | POA: Diagnosis not present

## 2023-12-23 DIAGNOSIS — C50911 Malignant neoplasm of unspecified site of right female breast: Secondary | ICD-10-CM | POA: Diagnosis not present

## 2023-12-23 DIAGNOSIS — C50912 Malignant neoplasm of unspecified site of left female breast: Secondary | ICD-10-CM | POA: Diagnosis not present

## 2023-12-23 DIAGNOSIS — C50412 Malignant neoplasm of upper-outer quadrant of left female breast: Secondary | ICD-10-CM | POA: Diagnosis not present

## 2023-12-23 MED ORDER — ACETAMINOPHEN 500 MG PO TABS
ORAL_TABLET | ORAL | Status: AC
Start: 2023-12-23 — End: ?
  Filled 2023-12-23: qty 2

## 2023-12-23 MED ORDER — ACETAMINOPHEN 500 MG PO TABS
ORAL_TABLET | ORAL | Status: AC
  Filled 2023-12-23: qty 2

## 2023-12-23 MED ORDER — CEFAZOLIN SODIUM-DEXTROSE 2-4 GM/100ML-% IV SOLN
INTRAVENOUS | Status: AC
  Filled 2023-12-23: qty 100

## 2023-12-23 NOTE — Discharge Summary (Signed)
 [facility] SURGICAL DISCHARGE SUMMARY  Patient ID: Angelica Dougherty MRN: [id number] DOB/AGE: [birth year] 54 y.o.  Admit date: 12/22/2023 Discharge date: 12/23/2023  Discharge Diagnoses Patient Active Problem List   Diagnosis Date Noted   S/P bilateral mastectomy 12/22/2023    Consultants Plastic Surgery   Procedures 12/21/2022:  Bilateral mastectomy with bilateral SLNB  12/21/2022:  Bilateral immediate breast reconstruction with placement of Acellular Dermal Matrix and tissue expanders.   HPI: Angelica Dougherty is a 53 y.o. female with history of bilateral breast CA who presents to [facility] on 01/13 for scheduled bilateral mastectomy with bilateral SLNB with immediate reconstruction.   Hospital Course: Informed consent was obtained and documented, and patient underwent uneventful bilateral mastectomy with bilateral SLNB with immediate reconstruction (Dr Marinda & Dr Lowery, 12/21/2022).  Post-operatively, patient did well. Drains borderline sanguinous but output low. Otherwise doing well. Advancement of patient's diet and ambulation were well-tolerated. The remainder of patient's hospital course was essentially unremarkable, and discharge planning was initiated accordingly with patient safely able to be discharged home with appropriate discharge instructions, pain control, and outpatient follow-up after all of her questions were answered to her expressed satisfaction.   Discharge Condition: Good   Physical Examination:  Constitutional: Well appearing female, NAD Pulmonary: Normal effort, no respiratory distress Skin: Chaperone present, bilateral mastectomy incisions are CDI with steri-strips, no erythema or swelling. 2 drains present bilaterally, sanguinous but low output. Breast binder in place.    Allergies as of 12/23/2023       Reactions   Codeine Other (See Comments)   Swelling        Medication List     TAKE these medications    diazepam  2 MG  tablet Commonly known as: Valium  Take 1 tablet (2 mg total) by mouth every 12 (twelve) hours as needed for muscle spasms.   diphenhydrAMINE 25 MG tablet Commonly known as: BENADRYL Take 25 mg by mouth daily as needed for allergies.   ondansetron  4 MG tablet Commonly known as: Zofran  Take 1 tablet (4 mg total) by mouth every 8 (eight) hours as needed for nausea or vomiting.          Follow-up Information     Dillingham, [name], DO. Go on 12/30/2023.   Specialty: Plastic Surgery Why: Go to appointment on 01/21 at 100 PM Contact information: [address] [place] [address] [phone]         Marinda Jayson KIDD, MD. Go on 01/01/2024.   Specialty: General Surgery Why: Go to appointment on 01/23 at 200 PM Contact information: [address] [place] [address] [phone]                  Time spent on discharge management including discussion of hospital course, clinical condition, outpatient instructions, prescriptions, and follow up with the patient and members of the medical team: >30 minutes  -- Arthea Platt , PA-C [facility]  12/23/2023, 8:11 AM [phone] M-F: 7am - 4pm

## 2023-12-23 NOTE — Progress Notes (Signed)
 DISCHARGE NOTE:   Pt IV removed and dc instructions given. Pt was educated by nurse on how to empty JP drains and log the output for each. Pt voices no concerns or questions at this time. Pt wheeled down to medical mall entrance by staff and pt's family provided transportation.

## 2023-12-23 NOTE — Plan of Care (Signed)

## 2023-12-23 NOTE — Anesthesia Postprocedure Evaluation (Signed)
 Anesthesia Post Note  Patient: Angelica Dougherty  Procedure(s) Performed: MASTECTOMY WITH SENTINEL LYMPH NODE BIOPSY, simple mastectomy (Bilateral) BREAST RECONSTRUCTION WITH PLACEMENT OF TISSUE EXPANDER AND FLEX HD (ACELLULAR HYDRATED DERMIS) (Bilateral)  Patient location during evaluation: PACU Anesthesia Type: General Level of consciousness: awake and alert Pain management: pain level controlled Vital Signs Assessment: post-procedure vital signs reviewed and stable Respiratory status: spontaneous breathing, nonlabored ventilation, respiratory function stable and patient connected to nasal cannula oxygen Cardiovascular status: blood pressure returned to baseline and stable Postop Assessment: no apparent nausea or vomiting Anesthetic complications: no   No notable events documented.   Last Vitals:  Vitals:   12/23/23 0019 12/23/23 0518  BP: 120/72 124/66  Pulse: 93 98  Resp: 16 18  Temp: 37 C   SpO2: 95% 96%    Last Pain:  Vitals:   12/23/23 0750  TempSrc:   PainSc: 5                  [name]

## 2023-12-23 NOTE — Plan of Care (Signed)
  Problem: Clinical Measurements: Goal: Respiratory complications will improve Outcome: Progressing   Problem: Coping: Goal: Level of anxiety will decrease Outcome: Progressing   Problem: Elimination: Goal: Will not experience complications related to urinary retention Outcome: Progressing   Problem: Pain Management: Goal: General experience of comfort will improve Outcome: Progressing

## 2023-12-26 ENCOUNTER — Telehealth: Payer: Self-pay | Admitting: Plastic Surgery

## 2023-12-26 NOTE — Telephone Encounter (Signed)
Patient wanted to know what could she hang her drains on while in the shower. I adv patient that she could sit a chair in the shower and put the drains in her lap while she bathed. Patient also asked if she could wash the binder and wear a sports bra. I adv that she could do both. If the sports bra pushed on her incisions too much she could place a maxi pad or abd pad in that area to provide more comfort. Patient conveyed understanding. She's also aware of her appt with Dr. Ulice Bold next Tues, 1/21.

## 2023-12-26 NOTE — Telephone Encounter (Signed)
Patient has hygiene concerns after surgery on 12/22/23.

## 2023-12-29 ENCOUNTER — Encounter: Payer: Self-pay | Admitting: General Surgery

## 2023-12-29 NOTE — Op Note (Signed)
Operative Note  Preoperative diagnosis: Bilateral breast cancer Postoperative diagnosis: Same Procedure: Injection of isoflurane blue bilateral breasts, bilateral mastectomy, bilateral sentinel lymph node biopsy Surgeon: Baker Pierini, MD Assistant: Darrelyn Hillock, PA-C EBL: 200ccs  After informed consent was obtained the patient was brought to the operating room placed supine on the operating room table.  General endotracheal anesthesia was induced .  Prior to injection of isoflurane blue a surgical timeout was called identifying correct patient, site, side and procedure.  5 mL of isoflurane blue were injected into the intradermal space at the nipple areolar border in 4 quadrants.  This was done bilaterally.  The breast was then massaged for several minutes to allow for uptake of the isoflurane blue.  The chest and breast were then prepped and draped in the usual sterile fashion.  An additional surgical timeout was called identifying correct patient, site, side and procedure.  I started on the left breast.  An elliptical incision was made surrounding the nipple areolar complex.  Superiorly skin flaps were raised towards the clavicle.  This was taken up to the clavicle and then down to the pectoralis major muscle.  Superiorly and medially there was a mass that was palpated and this was taken with the specimen.  This felt adhered to the pectoralis major muscle and some of the pectoralis major muscle that the mass was adherent to was taken with the specimen.  This was taken medially towards the sternum.  I then developed flaps inferiorly down to the inframammary fold.  This also was taken down to the pectoralis major muscle.  The breast was taken off of the pectoralis major muscle towards the axilla.  This is taken up towards the tail of Spence and the breast tissue was then fully excised.  The breast specimen was then appropriately tagged for pathology using a silk suture.  Next using the neoprobe identified  appropriate technetium uptake into the axilla.  The clavipectoral fascia was then opened and nodes were identified.  These were grasped with an Allis and dissected from the surrounding fibrofatty tissue.  Lymph channels were clipped and tied as needed.  Several sentinel lymph nodes were identified and excised fully.  These were radioactive with the neoprobe and also appeared to be blue nodes.  These were then passed off the table as specimen.  There is no further background radiation into the axilla.  All hemostasis was obtained and the axilla was then irrigated appropriately.  Next I turned my attention to the right breast.  An elliptical incision was made around the nipple areolar complex.  Similar to the left breast at the superior flap was made and the breast tissue was dissected from the subcutaneous tissue up towards the clavicle.  This was done immediately towards the sternum.  This was taken down to the pectoralis major muscle.  There were no palpable masses on the right breast and this does not appear to invade any of the pec major muscle.  Inferiorly a plane was identified between the subcutaneous tissue and the breast tissue.  This was taken down to the inframammary fold.  The breast was then taken off of the pectoralis major muscle towards the axilla and tail of Spence.  The tail of Mliss Sax was fully excised and the breast was fully removed.  This was appropriately tagged with a silk suture.  The clavipectoral fascia was opened to expose the axilla.  Using the neoprobe I identified a positive sentinel lymph node.  The sentinel lymph node appeared  blue.  Was dissected off the fibrofatty tissue and the lymph channels were clipped and tied appropriately.  This was excised fully and appeared blue.  There was an additional lymph node with background radiation that was also excised in this manner.  Once these were excised they were passed off the table specimen and there were no further background radiation in  the axilla.  Hemostasis was obtained.  I then allowed for plastic and reconstructive surgery to start their procedure.  Please see their separate procedure note.  Prior to termination of my part of the procedure all sponge and instrument counts were correct x 2.  The assistance of Darrelyn Hillock, PA-C was needed for adequate retraction, tissue manipulation and dissection.

## 2023-12-30 ENCOUNTER — Encounter: Payer: BC Managed Care – PPO | Admitting: Plastic Surgery

## 2023-12-30 ENCOUNTER — Ambulatory Visit (INDEPENDENT_AMBULATORY_CARE_PROVIDER_SITE_OTHER): Payer: BC Managed Care – PPO | Admitting: Plastic Surgery

## 2023-12-30 ENCOUNTER — Encounter: Payer: Self-pay | Admitting: Plastic Surgery

## 2023-12-30 VITALS — BP 150/70 | HR 76

## 2023-12-30 DIAGNOSIS — D0511 Intraductal carcinoma in situ of right breast: Secondary | ICD-10-CM

## 2023-12-30 DIAGNOSIS — Z9013 Acquired absence of bilateral breasts and nipples: Secondary | ICD-10-CM

## 2023-12-30 LAB — SURGICAL PATHOLOGY

## 2023-12-30 NOTE — Progress Notes (Signed)
   Subjective:    Patient ID: Angelica Dougherty, female    DOB: 01-27-1971, 53 y.o.   MRN: [id number]  The patient is a 53 year old female here for follow-up after undergoing bilateral mastectomies with reconstruction on January 13.  The patient had 250 cc of injectable saline placed in each expander.  She has 535 cc expanders in place.  Her drain output is minimal.  No sign of hematoma or seroma.      Review of Systems  Constitutional:  Positive for activity change.  Eyes: Negative.   Respiratory: Negative.  Negative for apnea.   Cardiovascular: Negative.   Gastrointestinal: Negative.        Objective:   Physical Exam Constitutional:      Appearance: Normal appearance.  Cardiovascular:     Rate and Rhythm: Normal rate.     Pulses: Normal pulses.  Abdominal:     Palpations: Abdomen is soft.  Skin:    Capillary Refill: Capillary refill takes less than 2 seconds.  Neurological:     Mental Status: She is oriented to person, place, and time.  Psychiatric:        Mood and Affect: Mood normal.        Behavior: Behavior normal.        Thought Content: Thought content normal.        Assessment & Plan:     ICD-10-CM   1. S/P bilateral mastectomy  Z90.13     2. Ductal carcinoma in situ (DCIS) of right breast  D05.11       We placed injectable saline in the Expander using a sterile technique: Right: 50 cc for a total of 300 / 535 cc Left: 50 cc for a total of 300 / 535 cc  Will plan to take 1 drain out next week and likely the final drain out the week after.

## 2023-12-31 ENCOUNTER — Encounter: Payer: Self-pay | Admitting: *Deleted

## 2023-12-31 NOTE — Progress Notes (Signed)
Oncotype Dx submitted online for multiple primaries.  Request for left breast specimen to be tested first.

## 2024-01-01 ENCOUNTER — Encounter: Payer: Self-pay | Admitting: General Surgery

## 2024-01-01 ENCOUNTER — Ambulatory Visit: Payer: BC Managed Care – PPO | Admitting: General Surgery

## 2024-01-01 VITALS — BP 154/84 | HR 75 | Temp 98.0°F | Ht 73.0 in

## 2024-01-01 DIAGNOSIS — C50412 Malignant neoplasm of upper-outer quadrant of left female breast: Secondary | ICD-10-CM

## 2024-01-01 DIAGNOSIS — Z170 Estrogen receptor positive status [ER+]: Secondary | ICD-10-CM

## 2024-01-01 DIAGNOSIS — Z08 Encounter for follow-up examination after completed treatment for malignant neoplasm: Secondary | ICD-10-CM

## 2024-01-01 NOTE — Patient Instructions (Addendum)
Follow up with Plastic Surgery as scheduled.  Follow up here in 2 weeks.     Please call and ask to speak with a nurse if you develop questions or concerns.   Surgical [facility] Surgical drains are placed during surgery. They are used to get rid of the extra fluid that can build up in a wound after surgery. They can also help heal the wound. The kinds of drains include: Active drains. These use suction to pull drainage away from the wound. Drainage flows through a tube to a container outside of the body. With these drains, you need to keep the bulb or the container flat (compressed) at all times, except while being emptied. Flattening the bulb or container creates suction. One of the most common types of active drains is the Jackson-Pratt (JP) drain. Passive drains. These allow fluid to drain using gravity rather than suction. Drainage flows through a tube to a bandage (dressing) or a container outside of the body. These drains do not need to be emptied. One of the most common types of passive drains is the Penrose drain. Right after surgery, drainage is often bright red and a little thicker than water. It may turn yellow or pink over time and become thinner. Your health care provider may remove the drain when the drainage stops or when the amount decreases to 1-2 tbsp (15-30 mL) in a 24-hour period. How to care for your surgical drain Care for your drain as told by your provider. Keep the skin around the drain dry and covered with a dressing at all times. This can help to prevent infection. If the drain is placed at your back, or in any other hard-to-reach area, ask someone to help you change the dressing, empty the drain, and check for infection. Changing the dressing Follow instructions from your provider about how to change your dressing. Change it at least once a day. Change it more often if needed to keep the dressing dry. Make sure you: Gather your supplies. These may  include: Tape. Germ-free cleaning solution (sterile saline). Cotton swabs. Split gauze drain sponge: 4 x 4 inches (10 x 10 cm). Gauze square: 4 x 4 inches (10 x 10 cm). Wash your hands with soap and water for at least 20 seconds before and after you change your dressing. If soap and water are not available, use hand sanitizer. Remove the old dressing. Avoid using scissors to do that. Wash your hands with soap and water again after taking off the old dressing. Use sterile saline to clean the skin around the drain. You may need to use a cotton swab to clean the skin. Place the tube through the slit in a drain sponge. Place the drain sponge so that it covers your wound. Place the gauze square or another drain sponge on top of the drain sponge that is on the wound. Make sure the tube is between those layers. Tape the dressing to your skin. Then, tape the drainage tube to your skin 1-2 inches (2.5-5 cm) below the place where the tube enters your body. Taping keeps the tube from pulling on any stitches (sutures) that you have. Wash your hands with soap and water. Write down the color of your drainage and how often you change your dressing. Emptying the active drain  Make sure you have a measuring cup that you can empty your drainage into. Wash your hands with soap and water for at least 20 seconds before and after you empty your  drain. If soap and water are not available, use hand sanitizer. Loosen any pins or clips that hold the tube in place. If your provider tells you to strip the tube to prevent clots and tube blockages: Hold the tube at the skin with one hand. Use your other hand to pinch the tube with your thumb and first finger. Gently move your fingers down the tube while squeezing very lightly. This clears any drainage, clots, or tissue from the tube. You may need to do this a few times a day to keep the tube clear. Do not pull on the tube. Open the bulb cap or the drain plug. Do not touch  the inside of the cap or the bottom of the plug. Turn the device upside down and gently squeeze. Empty all of the drainage into the measuring cup. Compress the bulb or the container. Replace the cap or the plug. To compress the bulb or the container, squeeze it firmly in the middle while you close the cap or plug the container. Write down the amount of drainage that you have in each 24-hour period. If you have less than 2 tbsp (30 mL) of drainage during the 24 hours, contact your provider. Flush the drainage down the toilet. Wash your hands with soap and water. Checking for infection Check your drain area every day for signs of infection. Check for: Redness, swelling, or pain. Warmth. Pus or a bad smell. Drainage that looks cloudy. Tenderness or pressure at the spot where the drain leaves your body. Contact a health care provider if: You have any signs of infection around your drain area. You have a fever or chills. The amount of drainage that you have stops all of a sudden, or the drainage increases rather than decreases. Your tube falls out. Your active drain does not stay compressed after you empty it. The tube gets detached from the bulb or container. This information is not intended to replace advice given to you by your health care provider. Make sure you discuss any questions you have with your health care provider. Document Revised: 07/12/2022 Document Reviewed: 07/12/2022 Elsevier Patient Education  2024 [employer].

## 2024-01-06 ENCOUNTER — Encounter: Payer: BC Managed Care – PPO | Admitting: Surgical

## 2024-01-06 ENCOUNTER — Telehealth: Payer: Self-pay | Admitting: Surgical

## 2024-01-06 NOTE — Telephone Encounter (Signed)
She is going to come see me Thursday at 2:30 pm. I just spoke with her. Do you mind booking appt for her?  Thank you

## 2024-01-06 NOTE — Telephone Encounter (Signed)
Patient was told to contact if there wasn't much draining and its below 15ml on both.please advise, she would like to know what to do next

## 2024-01-08 ENCOUNTER — Ambulatory Visit (INDEPENDENT_AMBULATORY_CARE_PROVIDER_SITE_OTHER): Payer: BC Managed Care – PPO | Admitting: Surgical

## 2024-01-08 ENCOUNTER — Encounter: Payer: Self-pay | Admitting: Surgical

## 2024-01-08 VITALS — BP 127/66 | HR 67 | Ht 73.0 in | Wt 190.0 lb

## 2024-01-08 DIAGNOSIS — Z9013 Acquired absence of bilateral breasts and nipples: Secondary | ICD-10-CM

## 2024-01-08 DIAGNOSIS — C50911 Malignant neoplasm of unspecified site of right female breast: Secondary | ICD-10-CM

## 2024-01-08 DIAGNOSIS — D0511 Intraductal carcinoma in situ of right breast: Secondary | ICD-10-CM

## 2024-01-08 NOTE — Progress Notes (Signed)
Patient is a very pleasant 53 year old female here for follow-up on her bilateral breast reconstruction.  She bilateral mastectomy with placement of tissue expanders and Flex HD with Dr. Maurine Minister and Dr. Ulice Bold on 12/22/2023.  She was last seen here in the clinic on 12/30/2023.  She reports that she has been doing well since then, she is not having any infectious symptoms.  She does report some constipation, but is having bowel movements.  She reports pain has been well-controlled without any issues.  JP drain output has been very minimal, approximately 2 to 5 cc per 24 hours per each drain.  She has 4 drains in place.  She has a total of 300 cc in each tissue expander at this time.  Chaperone present on exam BP 127/66 (BP Location: Left Arm, Patient Position: Sitting, Cuff Size: Normal)   Pulse 67   Ht 6\' 1"  (1.854 m)   Wt 190 lb (86.2 kg)   LMP 06/08/2006 (Approximate) Comment: Hysterectomy  SpO2 100%   BMI 25.07 kg/m  She is well-developed, well-nourished, no acute distress.  She is sitting upright on exam. On exam bilateral breast incisions are intact, no erythema or cellulitic changes noted.  She does have a left axillary incision that is approximately 1.5 cm.  There is no subcutaneous fluid collection noted with palpation.  Serosanguineous drainage noted in each bulb.  Bilateral breasts are soft, no ecchymosis noted.  Mild postoperative swelling noted.  A/P:  Patient is overall doing well after bilateral immediate breast reconstruction.  She is not having any infectious symptoms or concerns on exam.  She appears to be healing well.  We were able to remove 1 drain from each side today.  We will plan to remove the other 2 drains early next week pending output.  She has had minimal output from all the drains, but was reluctant to remove all for today.  We did not do an expansion today, would like to allow her to continue to heal and will plan to expand potentially next week or the  following week.  She reported that she does not want to be very large

## 2024-01-09 ENCOUNTER — Encounter: Payer: Self-pay | Admitting: *Deleted

## 2024-01-09 NOTE — Progress Notes (Signed)
Per Dr. Alena Bills move Angelica Dougherty appt until after oncotype is back and also refer to Dr. Lestine Box with patient and appt. Has been moved.   Will reach out to radiation and see if she can see Dr. Rushie Chestnut on the same day.

## 2024-01-12 ENCOUNTER — Inpatient Hospital Stay: Payer: BC Managed Care – PPO | Admitting: Internal Medicine

## 2024-01-12 ENCOUNTER — Encounter: Payer: Self-pay | Admitting: General Surgery

## 2024-01-12 ENCOUNTER — Encounter: Payer: Self-pay | Admitting: *Deleted

## 2024-01-12 DIAGNOSIS — C50912 Malignant neoplasm of unspecified site of left female breast: Secondary | ICD-10-CM

## 2024-01-12 DIAGNOSIS — C50911 Malignant neoplasm of unspecified site of right female breast: Secondary | ICD-10-CM

## 2024-01-13 ENCOUNTER — Encounter: Payer: BC Managed Care – PPO | Admitting: General Surgery

## 2024-01-13 ENCOUNTER — Ambulatory Visit: Payer: BC Managed Care – PPO | Admitting: Surgical

## 2024-01-13 VITALS — BP 137/73 | HR 60

## 2024-01-13 DIAGNOSIS — D0511 Intraductal carcinoma in situ of right breast: Secondary | ICD-10-CM

## 2024-01-13 DIAGNOSIS — Z9013 Acquired absence of bilateral breasts and nipples: Secondary | ICD-10-CM

## 2024-01-13 DIAGNOSIS — C50911 Malignant neoplasm of unspecified site of right female breast: Secondary | ICD-10-CM

## 2024-01-13 NOTE — Progress Notes (Signed)
 Patient is a very pleasant 53 year old female here for follow-up on her bilateral breast reconstruction.  She had bilateral mastectomy with placement tissue expanders and Flex HD with Dr. Marinda and Dr. Lowery on 12/22/2023.  Patient reports she is overall doing really well, she reports pain is well-controlled.  She is anxious for removal of the drains due to discomfort associated.  She reports JP drain output has been approximately 10 to 15 cc per 24 hours over the last few days.  She still has a total of 300 cc in each tissue expander at this time.  Chaperone present on exam BP 137/73 (BP Location: Left Arm, Patient Position: Sitting, Cuff Size: Normal)   Pulse 60   LMP 06/08/2006 (Approximate) Comment: Hysterectomy  SpO2 98%  On exam bilateral breast incisions are intact, Steri-Strips are in place.  No erythema or cellulitic changes noted.  No subcutaneous fluid collection noted.  Left axillary incision is intact with Steri-Strip in place.  No subcutaneous fluid collection of palpation.  Serosanguineous drainage on today's bulb.  Breasts are soft, symmetric.  A/P:  Patient is doing well, no signs of infection or concern.  Bilateral JP drains were removed, patient tolerated this well.   Recommend continue with compressive garments, which strenuous activities or heavy lifting.  We did not expand today, will continue to allow her to heal and expand her in 2 weeks at her next appointment.  She would only need 1-2 expander fills based on her desired size request

## 2024-01-20 ENCOUNTER — Ambulatory Visit
Admission: RE | Admit: 2024-01-20 | Discharge: 2024-01-20 | Disposition: A | Discharge status: Home / Self Care | Payer: BC Managed Care – PPO | Source: Ambulatory Visit | Attending: Radiation Oncology | Admitting: Radiation Oncology

## 2024-01-20 ENCOUNTER — Telehealth: Payer: Self-pay | Admitting: *Deleted

## 2024-01-20 ENCOUNTER — Other Ambulatory Visit: Payer: Self-pay

## 2024-01-20 ENCOUNTER — Inpatient Hospital Stay: Payer: BC Managed Care – PPO

## 2024-01-20 ENCOUNTER — Encounter: Payer: Self-pay | Admitting: Internal Medicine

## 2024-01-20 ENCOUNTER — Encounter: Payer: Self-pay | Admitting: *Deleted

## 2024-01-20 ENCOUNTER — Inpatient Hospital Stay: Payer: BC Managed Care – PPO | Attending: Internal Medicine | Admitting: Internal Medicine

## 2024-01-20 VITALS — BP 120/76 | HR 75 | Temp 97.6°F | Resp 18 | Ht 73.0 in | Wt 190.0 lb

## 2024-01-20 DIAGNOSIS — Z808 Family history of malignant neoplasm of other organs or systems: Secondary | ICD-10-CM | POA: Insufficient documentation

## 2024-01-20 DIAGNOSIS — E785 Hyperlipidemia, unspecified: Secondary | ICD-10-CM | POA: Insufficient documentation

## 2024-01-20 DIAGNOSIS — C50212 Malignant neoplasm of upper-inner quadrant of left female breast: Secondary | ICD-10-CM | POA: Insufficient documentation

## 2024-01-20 DIAGNOSIS — C50412 Malignant neoplasm of upper-outer quadrant of left female breast: Secondary | ICD-10-CM | POA: Insufficient documentation

## 2024-01-20 DIAGNOSIS — Z1721 Progesterone receptor positive status: Secondary | ICD-10-CM | POA: Insufficient documentation

## 2024-01-20 DIAGNOSIS — Z809 Family history of malignant neoplasm, unspecified: Secondary | ICD-10-CM | POA: Insufficient documentation

## 2024-01-20 DIAGNOSIS — Z170 Estrogen receptor positive status [ER+]: Secondary | ICD-10-CM | POA: Diagnosis not present

## 2024-01-20 DIAGNOSIS — Z9011 Acquired absence of right breast and nipple: Secondary | ICD-10-CM | POA: Insufficient documentation

## 2024-01-20 DIAGNOSIS — Z803 Family history of malignant neoplasm of breast: Secondary | ICD-10-CM | POA: Insufficient documentation

## 2024-01-20 DIAGNOSIS — Z9013 Acquired absence of bilateral breasts and nipples: Secondary | ICD-10-CM | POA: Diagnosis not present

## 2024-01-20 DIAGNOSIS — D0511 Intraductal carcinoma in situ of right breast: Secondary | ICD-10-CM | POA: Diagnosis not present

## 2024-01-20 DIAGNOSIS — C50912 Malignant neoplasm of unspecified site of left female breast: Secondary | ICD-10-CM | POA: Diagnosis not present

## 2024-01-20 DIAGNOSIS — C50911 Malignant neoplasm of unspecified site of right female breast: Secondary | ICD-10-CM | POA: Diagnosis not present

## 2024-01-20 DIAGNOSIS — R293 Abnormal posture: Secondary | ICD-10-CM | POA: Insufficient documentation

## 2024-01-20 DIAGNOSIS — Z825 Family history of asthma and other chronic lower respiratory diseases: Secondary | ICD-10-CM | POA: Diagnosis not present

## 2024-01-20 DIAGNOSIS — Z885 Allergy status to narcotic agent status: Secondary | ICD-10-CM | POA: Diagnosis not present

## 2024-01-20 DIAGNOSIS — N6022 Fibroadenosis of left breast: Secondary | ICD-10-CM | POA: Insufficient documentation

## 2024-01-20 DIAGNOSIS — N83202 Unspecified ovarian cyst, left side: Secondary | ICD-10-CM | POA: Insufficient documentation

## 2024-01-20 DIAGNOSIS — Z79899 Other long term (current) drug therapy: Secondary | ICD-10-CM | POA: Insufficient documentation

## 2024-01-20 DIAGNOSIS — Z90710 Acquired absence of both cervix and uterus: Secondary | ICD-10-CM | POA: Insufficient documentation

## 2024-01-20 LAB — CBC WITH DIFFERENTIAL/PLATELET
Abs Immature Granulocytes: 0.02 10*3/uL (ref 0.00–0.07)
Basophils Absolute: 0 10*3/uL (ref 0.0–0.1)
Basophils Relative: 1 %
Eosinophils Absolute: 0.1 10*3/uL (ref 0.0–0.5)
Eosinophils Relative: 2 %
HCT: 37.7 % (ref 36.0–46.0)
Hemoglobin: 12.6 g/dL (ref 12.0–15.0)
Immature Granulocytes: 0 %
Lymphocytes Relative: 40 %
Lymphs Abs: 2.6 10*3/uL (ref 0.7–4.0)
MCH: 31.3 pg (ref 26.0–34.0)
MCHC: 33.4 g/dL (ref 30.0–36.0)
MCV: 93.5 fL (ref 80.0–100.0)
Monocytes Absolute: 0.4 10*3/uL (ref 0.1–1.0)
Monocytes Relative: 7 %
Neutro Abs: 3.3 10*3/uL (ref 1.7–7.7)
Neutrophils Relative %: 50 %
Platelets: 310 10*3/uL (ref 150–400)
RBC: 4.03 MIL/uL (ref 3.87–5.11)
RDW: 12.9 % (ref 11.5–15.5)
WBC: 6.5 10*3/uL (ref 4.0–10.5)
nRBC: 0 % (ref 0.0–0.2)

## 2024-01-20 LAB — COMPREHENSIVE METABOLIC PANEL
ALT: 18 U/L (ref 0–44)
AST: 20 U/L (ref 15–41)
Albumin: 4.6 g/dL (ref 3.5–5.0)
Alkaline Phosphatase: 49 U/L (ref 38–126)
Anion gap: 10 (ref 5–15)
BUN: 10 mg/dL (ref 6–20)
CO2: 27 mmol/L (ref 22–32)
Calcium: 9.5 mg/dL (ref 8.9–10.3)
Chloride: 101 mmol/L (ref 98–111)
Creatinine, Ser: 0.64 mg/dL (ref 0.44–1.00)
GFR, Estimated: >60 mL/min (ref >60)
Glucose, Bld: 85 mg/dL (ref 70–99)
Potassium: 4.3 mmol/L (ref 3.5–5.1)
Sodium: 138 mmol/L (ref 135–145)
Total Bilirubin: 1.1 mg/dL (ref 0.0–1.2)
Total Protein: 7.9 g/dL (ref 6.5–8.1)

## 2024-01-20 NOTE — Telephone Encounter (Signed)
The oncotype would like to speak to MD or Nurse about the test submitted. The paitent had several lesions on left and right breasts and they want to be correct as the specimens and how many and to please call back [phone] option 1

## 2024-01-20 NOTE — Progress Notes (Signed)
[facility] CONSULT NOTE  Patient Care Team: Jacky Kindle, FNP (Inactive) as PCP - General (Family Medicine) Michaelyn Barter, MD as Consulting Physician (Oncology) [name], MD as Consulting Physician (Radiation Oncology)  REFERRING PROVIDER: Merita Norton   REASON FOR REFFERAL: Right breast DCIS, left breast IDC  CANCER STAGING   Cancer Staging  Breast cancer, left [facility]) Staging form: Breast, AJCC 8th Edition - Clinical: Stage IIA (cT2, cN0, cM0, G3, ER+, PR+, HER2-) - Signed by Michaelyn Barter, MD on 09/30/2023 Histologic grading system: 3 grade system - Pathologic stage from 12/22/2023: Stage IA (pT1c, pN0(i+)(sn), cM0, G3, ER+, PR+, HER2-) - Signed by Michaelyn Barter, MD on 01/20/2024 Stage prefix: Initial diagnosis Method of lymph node assessment: Sentinel lymph node biopsy Multigene prognostic tests performed: Oncotype DX Histologic grading system: 3 grade system  Breast cancer, right [facility] Staging form: Breast, AJCC 8th Edition - Pathologic stage from 12/22/2023: pT1c, pN0, cM0, ER+, PR+, HER2- - Signed by Michaelyn Barter, MD on 01/20/2024 Stage prefix: Initial diagnosis Nuclear grade: G3 Multigene prognostic tests performed: Oncotype DX   ASSESSMENT & PLAN:  Angelica Dougherty 53 y.o. 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  # Left breast IDC, pathologic Stage IA, pT, ER/PR positive, HER2 negative -Full imaging and pathology report below.  -s/p bilateral mastectomies with immediate reconstruction by Dr. Maurine Minister and Dr. Ulice Bold on 12/23/2023.  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  6 lymph nodes removed.  1 lymph node positive for individual tumor cells.  Tumor size 16 mm, overall grade 3, anterior superior margins involved, ER 90% positive, PR 90% positive, HER2 1+ IHC negative.  pT1 pN0 (I+)  -Case was discussed with Dr.  Maurine Minister.  At the time of the resection, markers also came out which left the positive margin and it will be difficult to do reexcision.  Dr. Aggie Cosier and there may be a role for postmastectomy radiation.  She is scheduled to see him today.  -Waiting on Oncotype DX testing to assess role for adjuvant chemotherapy.  -History of hysterectomy in 2007.  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  Left ovarian cyst however it was not removed.  -Estradiol 58, LH and FSH is low.  Her hormonal levels are still following to completely/perimenopausal.  I would like to repeat hormonal testing again today.  If she is postmenopausal, may consider doing letrozole for at least 5 years.  Will need DEXA scan as baseline.  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  Tumor size 12 mm overall grade 3.  Margins negative.  0/3 lymph node involved.  ER 95% positive, PR 95%, HER2 IHC 2+ by FISH.  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  -As above.  Waiting on Oncotype DX testing.  # Family history of breast cancer -[employer] gene panel testing done in 2024 was negative   Orders Placed This Encounter  Procedures   Estradiol    Standing Status:   Future    Expiration Date:   01/19/2025   FSH/LH    Standing Status:   Future    Expiration Date:   01/19/2025   CBC with Differential/Platelet    Standing Status:   Future    Expiration Date:   01/19/2025   Comprehensive metabolic panel    Standing Status:  Future    Expiration Date:   01/19/2025   RTC in about 3 months for MD visit to discuss about endocrine therapy.  The total time spent in the appointment was 30 minutes encounter with patients including review of chart and various tests results, discussions about plan of care and coordination of care plan   All questions were answered. The patient knows to call the clinic with any problems, questions or concerns. No  barriers to learning was detected.  Michaelyn Barter, MD 2/11/20253:29 PM   HISTORY OF PRESENTING ILLNESS:  Angelica Dougherty 53 y.o. 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  Interval history Patient was seen today as follow-up accompanied by her nephew to discuss the pathology report. She is doing well overall from the surgical standpoint.  Has tissue expanders in place.   I have reviewed her chart and materials related to her cancer extensively and collaborated history with the patient. Summary of oncologic history is as follows: Oncology History  Breast cancer, left (HCC)  08/26/2023 Mammogram   Screening mammogram IMPRESSION: Further evaluation is suggested for possible calcifications in the right breast.   Further evaluation is suggested for possible mass in the left breast.  Diagnostic mammogram and ultrasound FINDINGS: RIGHT breast: In the upper central right breast at middle depth, there are amorphous and round calcifications in a linear distribution spanning 1.9 cm. These are new compared to most recent mammogram 12/31/2017. No suspicious mass or other findings in the right breast.   LEFT breast: The previously noted possible mass seen in the upper posterior left breast on MLO view only, overlying the pectoralis muscle, persists on additional views as a 1.8 cm irregular mass with indistinct margins in the associated architectural distortion. On tomosynthesis series, the mass localizes to the medial breast. No definite mammographic correlate seen on [facility] M view, likely due to the far posterior location. No suspicious calcifications or other suspicious findings in the left breast.   Targeted ultrasound of the left breast at the 11:30 position 18 cm from the nipple demonstrates a 2.1 x 1.2 x 1.2 cm irregular hypoechoic mass with indistinct and angular margins, corresponding to the  mammographic finding.   Targeted ultrasound of the left axilla demonstrates lymph nodes with normal morphology.   IMPRESSION: 1. Highly suspicious 2.1 cm mass at the left breast 11:30 position. 2. Suspicious linear calcifications in the upper central right breast. 3. No left axillary lymphadenopathy.     09/25/2023 Pathology Results   FINAL DIAGNOSIS       1. Breast, right, needle core biopsy, upper middle depth, ribbon clip :      - DUCTAL CARCINOMA IN SITU, INTERMEDIATE GRADE (2)      - CANNOT RULE OUT FOCAL MICROINVASION      - NECROSIS: PRESENT, COMEDO-TYPE      - CALCIFICATIONS: PRESENT      - DCIS LENGTH: 0.22 CM      - SEE NOTE       2. 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      - TUBULE FORMATION: SCORE 3      - NUCLEAR PLEOMORPHISM: SCORE 3      - MITOTIC COUNT: SCORE 2      - TOTAL SCORE: 8      - OVERALL GRADE: 3      -  LYMPHOVASCULAR INVASION: NOT IDENTIFIED      - CANCER LENGTH: 1.1 CM      - CALCIFICATIONS: NOT IDENTIFIED      - SEE NOTE  Results: IMMUNOHISTOCHEMICAL AND MORPHOMETRIC ANALYSIS PERFORMED MANUALLY Estrogen Receptor:  95%, POSITIVE, STRONG STAINING INTENSITY REFERENCE RANGE ESTROGEN RECEPTOR NEGATIVE     0% POSITIVE       =>1% All controls stained appropriately [employer], [place], Sports administrator, International aid/development worker ( Signed 10 21 2024) Breast, left, needle core biopsy, 11:30 o'clock, 18cmfn savi scout PROGNOSTIC INDICATORS  Results: IMMUNOHISTOCHEMICAL AND MORPHOMETRIC ANALYSIS PERFORMED MANUALLY The tumor cells are negative for Her2 (1+). Estrogen Receptor:  90%, POSITIVE, STRONG STAINING INTENSITY Progesterone Receptor:  90%, POSITIVE, STRONG STAINING INTENSITY REFERENCE RANGE ESTROGEN RECEPTOR NEGATIVE     0% POSITIVE       =>1% REFERENCE RANGE PROGESTERONE RECEPTOR NEGATIVE     0% POSITIVE        =>1%    09/30/2023 Cancer Staging    Staging form: Breast, AJCC 8th Edition - Clinical: Stage IIA (cT2, cN0, cM0, G3, ER+, PR+, HER2-) - Signed by Michaelyn Barter, MD on 09/30/2023 Histologic grading system: 3 grade system    Genetic Testing   Negative genetic testing on the [facility] BRCAPlus+CancerNext-Expanded+RNA panel. The final report date is 10/27/2023.  The CancerNext-Expanded gene panel offered by [facility] and includes sequencing, rearrangement, and RNA analysis for the following 71 genes: AIP, ALK, APC, ATM, BAP1, BARD1, BMPR1A, BRCA1, BRCA2, BRIP1, CDC73, CDH1, CDK4, CDKN1B, CDKN2A, CHEK2, DICER1, FH, FLCN, KIF1B, LZTR1, MAX, MEN1, MET, MLH1, MSH2, MSH6, MUTYH, NF1, NF2, NTHL1, PALB2,  PHOX2B, PMS2, POT1, PRKAR1A, PTCH1, PTEN, RAD51C, RAD51D, RB1, RET, SDHA, SDHAF2, SDHB, SDHC, SDHD, SMAD4, SMARCA4, SMARCB1, SMARCE1, STK11, SUFU, TMEM127, TP53, TSC1, TSC2 and VHL (sequencing and deletion/duplication); AXIN2, CTNNA1, EGFR, EGLN1, HOXB13, KIT, MITF, MSH3, PDGFRA, POLD1 and POLE (sequencing only); EPCAM and GREM1 (deletion/duplication only).   10/08/2023 Breast MRI   MRI bilateral breast-  IMPRESSION: 1. Biopsy-proven DCIS involving the UPPER INNER QUADRANT of the RIGHT breast at middle depth and biopsy-proven IDC with lobular features involving the UPPER INNER QUADRANT of the LEFT breast at posterior depth. 2. Suspicious 1.1 cm mass in the upper RIGHT breast at posterior depth, directly posterior to the biopsy-proven DCIS. 3. Indeterminate 2.0 cm non-mass enhancement involving the LOWER OUTER QUADRANT of the RIGHT breast at anterior depth. 4. Indeterminate 1.2 cm mass in the lower LEFT breast at posterior depth, adjacent to the chest wall. 5. 2 foci of indeterminate linear non-mass enhancement in the LEFT breast; 1.9 cm NME in the LOWER INNER QUADRANT middle depth and 2.2 cm NME in the lower breast at middle depth. 6. Solitary suspicious enhancing lesion involving the lower sternum. 7. Benign enhancing mass in the  outer LEFT breast at the near 3 o'clock location (stable over prior mammograms dating back to 2019).   RECOMMENDATION: 1. MRI-directed second-look ultrasound of both breasts to see if the masses described above can be identified and possibly biopsied if suspicious in appearance. 2. MRI guided core needle biopsy of the indeterminate non-mass enhancement involving the LOWER OUTER QUADRANT of the RIGHT breast and the 2 foci of non-mass enhancement in the LOWER INNER QUADRANT of the LEFT breast and the lower LEFT breast.     12/22/2023 Cancer Staging   Staging form: Breast, AJCC 8th Edition - Pathologic stage from 12/22/2023: Stage IA (pT1c, pN0(i+)(sn), cM0, G3, ER+, PR+, HER2-) - Signed by Michaelyn Barter, MD on 01/20/2024 Stage prefix: Initial diagnosis  Method of lymph node assessment: Sentinel lymph node biopsy Multigene prognostic tests performed: Oncotype DX Histologic grading system: 3 grade system    Pathology Results            12/23/2023 Definitive Surgery   S/p bilateral mastectomies with immediate reconstruction with Dr. Maurine Minister and Dr. Ulice Bold    Pathology Results   FINAL DIAGNOSIS       1. Breast, simple mastectomy, left :      - INVASIVE DUCTAL CARCINOMA WITH FOCAL LOBULAR FEATURES      - SEE ONCOLOGY TABLE AND NOTE       2. Lymph node, sentinel, biopsy, #2 right :      - ONE LYMPH NODE, NEGATIVE FOR METASTATIC CARCINOMA (0/1)      - SEE ONCOLOGY TABLE AND NOTE       3. Lymph node, sentinel, biopsy, #3 right :      - ONE LYMPH NODE, NEGATIVE FOR METASTATIC CARCINOMA (0/1)      - SEE ONCOLOGY TABLE AND NOTE       4. Lymph node, sentinel, biopsy, #1left :      - FOCAL BREAST PARENCHYMA WITH MARKED THERMAL ARTIFACT      - BENIGN FIBROADIPOSE TISSUE AND FOCAL MUSCLE      - LYMPHOID TISSUE IS NOT IDENTIFIED      - NEGATIVE FOR DEFINITIVE MALIGNANCY      - SEE ONCOLOGY TABLE AND NOTE       5. Lymph node, sentinel, biopsy, #2 left :      - FOCAL BREAST  PARENCHYMA WITH SECRETORY CHANGE, ADENOSIS, FIBROCYSTIC CHANGE      AND THERMAL ARTIFACT      - BENIGN FIBROADIPOSE TISSUE AND SCANT MUSCLE      - LYMPHOID TISSUE IS NOT IDENTIFIED      - NEGATIVE FOR MALIGNANCY      - SEE NOTE AND ONCOLOGY TABLE       6. Lymph node, sentinel, biopsy, #3 left :      - BREAST PARENCHYMA WITH FIBROCYSTIC CHANGES SECRETORY CHANGE, ADENOSIS AND      MARKED THERMAL ARTIFACT      - BENIGN FIBROADIPOSE TISSUE      - LYMPHOID TISSUE IS NOT IDENTIFIED      - NEGATIVE FOR DEFINITIVE MALIGNANCY      - SEE ONCOLOGY TABLE AND NOTE       7. Lymph node, sentinel, biopsy, #4 left :      - FOCAL BREAST PARENCHYMA WITH SECRETORY CHANGE, ADENOSIS  AND THERMAL ARTIFACT      - BENIGN FIBROADIPOSE TISSUE      - LYMPHOID TISSUE IS NOT IDENTIFIED      - NEGATIVE FOR MALIGNANCY      - SEE NOTE AND ONCOLOGY TABLE       8. Lymph node, sentinel, biopsy, #5 left :      - FOCAL BREAST PARENCHYMA WITH SECRETORY CHANGE AND THERMAL ARTIFACT      - BENIGN FIBROADIPOSE TISSUE AND SCANT MUSCLE      - LYMPHOID TISSUE IS NOT IDENTIFIED      - NEGATIVE FOR MALIGNANCY      - SEE NOTE AND ONCOLOGY TABLE       9. Lymph node, sentinel, biopsy, #6 left :      - ONE LYMPH NODE POSITIVE FOR INDIVIDUAL TUMOR CELLS (0/1)      - SEE ONCOLOGY TABLE AND NOTE       10. Breast, simple mastectomy, Right :      -  MULTIFOCAL, INVASIVE CARCINOMA WITH MIXED DUCTAL AND LOBULAR FEATURES      - INVASIVE DUCTAL CARCINOMA AND DUCTAL CARCINOMA IN SITU (DCIS)      - SEE ONCOLOGY TABLE AND NOTE       11. Lymph node, sentinel, biopsy, #1right :      - ONE LYMPH NODE, NEGATIVE FOR METASTATIC CARCINOMA (0/1)      - SEE ONCOLOGY TABLE AND NOTE       Diagnosis Note : - 7.      INVASIVE CARCINOMA OF THE BREAST:  Resection      Procedure: Mastectomy      Specimen Laterality: Left      Histologic Type: Invasive ductal carcinoma with focal lobular features      Histologic Grade:      Glandular  (Acinar)/Tubular Differentiation: 3      Nuclear Pleomorphism: 3      Mitotic Rate: 2      Overall Grade: 3      Tumor Size: 16 mm      Ductal Carcinoma In Situ: Not identified      Tumor Extent: N/A      Lymphatic and/or Vascular Invasion: Not identified      Treatment Effect in the Breast: No known presurgical therapy      Margins:      Distance from Closest Margin (mm): Anterior/Superior (Involved); Posterior (< 1      mm)      Specify Closest Margin (required only if <70mm): see above      Regional Lymph Nodes:      Number of Lymph Nodes Examined: 1      Number of Sentinel Nodes Examined: 1      Number of Lymph Nodes with Macrometastases (>2 mm): 0      Number of Lymph Nodes with Micrometastases: 0      Number of Lymph Nodes with Isolated Tumor Cells (=0.2 mm or =200 cells): 1      Size of Largest Metastatic Deposit (mm): Less than or equal to 0.2 mm, 200 cells      or less      Extranodal Extension: N/A      Distant Metastasis:   Estrogen Receptor: 90%, positive, strong staining intensity       Progesterone Receptor: 90%, positive, strong staining intensity       HER2: Negative (1+)       Ki-67: Not performed       Pathologic Stage Classification (pTNM, AJCC 8th Edition): pT1c, pN0 (i+) (sn)   Specimen Laterality: Right      Histologic Type: Invasive carcinoma with mixed ductal and lobular features (see      comment below)      Histologic Grade:      Glandular (Acinar)/Tubular Differentiation: 3      Nuclear Pleomorphism: 3      Mitotic Rate: 2      Overall Grade: 3      Tumor Size: 12 mm      Ductal Carcinoma In Situ: Present (ribbon clip site, see comment below)      Tumor Extent: N/A      Lymphatic and/or Vascular Invasion: Not identified      Treatment Effect in the Breast: No known presurgical therapy      Margins: All margins negative for invasive carcinoma or carcinoma in-situ      Distance from Closest Margin (mm): 3 mm      Specify Closest Margin (required  only if  <  10mm): Anterior/Inferior      DCIS Margins: Uninvolved by DCIS      Distance from Closest Margin (mm): 9 mm      Specify Closest Margin (required only if <40mm): Anterior/superior      Regional Lymph Nodes:      Number of Lymph Nodes Examined: 3      Number of Sentinel Nodes Examined: 3      Number of Lymph Nodes with Macrometastases (>2 mm): 0      Number of Lymph Nodes with Micrometastases: 0      Number of Lymph Nodes with Isolated Tumor Cells (=0.2 mm or =200 cells): 0      Size of Largest Metastatic Deposit (mm): N/A      Extranodal Extension: N/A      Distant Metastasis:      Distant Site(s) Involved: N/A      Breast Biomarker Testing Performed on Previous Biopsy:      Testing Performed on Case Number: [id number] (part 1, ribbon clip) for DCIS      Estrogen Receptor: 95%, positive, strong staining intensity      Progesterone Receptor: Not performed      HER2: Not performed      Ki-67: Not performed      Testing Performed on Case Number: [id number] (part 1, barbell clip)      Estrogen Receptor: 95%, positive, strong staining intensity      Progesterone Receptor: 95%, positive, strong staining intensity      HER2: FISH (negative), equivocal by IHC (2+)      Ki-67: Not performed      Pathologic Stage Classification (pTNM, AJCC 8th Edition): pT1c, pN0 (sn)      Representative Tumor Block:  8D, 8K      Comment(s): Biopsy site changes are present in both clip sites.  Multifocal      invasive carcinoma is present:      Lesion A (Ribbon Clip): Invasive ductal carcinoma, grade 3 (3, 3, 2); ductal      carcinoma in situ (DCIS), cribriform and solid types with focal necrosis, grade      2      Size of invasive component: 8 mm (2 blocks, 4 mm each)      Margins: All margins negative for invasive carcinoma and carcinoma in situ      Closest margin: Anterior/superior is 9.0 mm from DCIS      Lesion B (Barbell Clip): Invasive carcinoma with mixed ductal and lobular      features,  grade 3      Atypical lobular hyperplasia (ALH)      Size of invasive component: 12 mm (3 blocks, 4 mm each)      Margins: All margins negative for invasive carcinoma      Closest margin: Anterior/Inferior is 3.0 mm from invasive carcinoma      Lesion C (no clip identified): Invasive carcinoma with mixed ductal and lobular      features, grade 3      Atypical lobular hyperplasia (ALH)      Size of invasive component: 12 mm (3 blocks, 4 mm each)      Margins: All margins negative for invasive carcinoma        MEDICAL HISTORY:  Past Medical History:  Diagnosis Date   Allergy    Ductal carcinoma in situ (DCIS) of right breast 09/2023   Fibroid uterus    Hyperlipidemia    Malignant neoplasm of left breast in female, estrogen receptor  positive (HCC) 09/2023   Malignant neoplasm of right breast in female, estrogen receptor positive (HCC) 09/2023    SURGICAL HISTORY: Past Surgical History:  Procedure Laterality Date   ABDOMINAL HYSTERECTOMY  06/2006   Total hysterctomy excessive bleeding and fibroids per patient report. [facility]   BREAST BIOPSY Right 09/25/2023   stereo bx, calcs, RIBBON clip-path pending   BREAST BIOPSY Left 09/25/2023   [place] bx, savi tag as marker, path pending   BREAST BIOPSY Right 09/25/2023   MM RT BREAST BX W LOC DEV 1ST LESION IMAGE BX SPEC STEREO GUIDE 09/25/2023 [facility]   BREAST BIOPSY Left 09/25/2023   [place] LT BREAST BX W LOC DEV 1ST LESION IMG BX SPEC US GUIDE 09/25/2023 [facility]   BREAST EXCISIONAL BIOPSY Right 1993   benign   BREAST LUMPECTOMY Right 1991   benign   BREAST RECONSTRUCTION WITH PLACEMENT OF TISSUE EXPANDER AND FLEX HD (ACELLULAR HYDRATED DERMIS) Bilateral 12/22/2023   Procedure: BREAST RECONSTRUCTION WITH PLACEMENT OF TISSUE EXPANDER AND FLEX HD (ACELLULAR HYDRATED DERMIS);  Surgeon: Peggye Form, DO;  Location: [facility];  Service: Plastics;  Laterality: Bilateral;   CESAREAN SECTION  12/16/2005   MASTECTOMY  W/ SENTINEL NODE BIOPSY Bilateral 12/22/2023   Procedure: MASTECTOMY WITH SENTINEL LYMPH NODE BIOPSY, simple mastectomy;  Surgeon: Kandis Cocking, MD;  Location: [facility];  Service: General;  Laterality: Bilateral;    SOCIAL HISTORY: Social History   Socioeconomic History   Marital status: Single    Spouse name: Not on file   Number of children: 1   Years of education: Not on file   Highest education level: Some college, no degree  Occupational History   Not on file  Tobacco Use   Smoking status: Never    Passive exposure: Never   Smokeless tobacco: Never  Vaping Use   Vaping status: Never Used  Substance and Sexual Activity   Alcohol use: No   Drug use: No   Sexual activity: Not Currently  Other Topics Concern   Not on file  Social History Narrative   Not on file   Social Drivers of Health   Financial Resource Strain: Low Risk  (08/15/2023)   Overall Financial Resource Strain (CARDIA)    Difficulty of Paying Living Expenses: Not very hard  Food Insecurity: No Food Insecurity (12/22/2023)   Hunger Vital Sign    Worried About Running Out of Food in the Last Year: Never true    Ran Out of Food in the Last Year: Never true  Transportation Needs: No Transportation Needs (12/22/2023)   PRAPARE - Administrator, Civil Service (Medical): No    Lack of Transportation (Non-Medical): No  Physical Activity: Insufficiently Active (08/15/2023)   Exercise Vital Sign    Days of Exercise per Week: 2 days    Minutes of Exercise per Session: 20 min  Stress: No Stress Concern Present (08/15/2023)   [employer] of Occupational Health - Occupational Stress Questionnaire    Feeling of Stress : Not at all  Social Connections: Moderately Integrated (08/15/2023)   Social Connection and Isolation Panel [NHANES]    Frequency of Communication with Friends and Family: More than three times a week    Frequency of Social Gatherings with Friends and Family: Once a week    Attends  Religious Services: More than 4 times per year    Active Member of [employer] or Organizations: Yes    Attends Banker Meetings: More than 4 times per year  Marital Status: Never married  Intimate Partner Violence: Not At Risk (12/22/2023)   Humiliation, Afraid, Rape, and Kick questionnaire    Fear of Current or Ex-Partner: No    Emotionally Abused: No    Physically Abused: No    Sexually Abused: No    FAMILY HISTORY: Family History  Problem Relation Age of Onset   Asthma Mother    Cancer Maternal Uncle        back cancer at 34   Breast cancer Paternal Aunt        dx 30s, bilateral   Breast cancer Paternal Aunt        dx 15s   Breast cancer Cousin        dx 82s    ALLERGIES:  is allergic to codeine.  MEDICATIONS:  Current Outpatient Medications  Medication Sig Dispense Refill   acetaminophen (TYLENOL) 500 MG tablet Take 500 mg by mouth every 6 (six) hours as needed.     diazepam (VALIUM) 2 MG tablet Take 1 tablet (2 mg total) by mouth every 12 (twelve) hours as needed for muscle spasms. 20 tablet 0   diphenhydrAMINE (BENADRYL) 25 MG tablet Take 25 mg by mouth daily as needed for allergies.     ondansetron (ZOFRAN) 4 MG tablet Take 1 tablet (4 mg total) by mouth every 8 (eight) hours as needed for nausea or vomiting. 20 tablet 0   No current facility-administered medications for this visit.    REVIEW OF SYSTEMS:   Pertinent information mentioned in HPI All other systems were reviewed with the patient and are negative.  PHYSICAL EXAMINATION: ECOG PERFORMANCE STATUS: 0 - Asymptomatic  Vitals:   01/20/24 1345  BP: 120/76  Pulse: 75  Resp: 18  Temp: 97.6 F (36.4 C)   Filed Weights   01/20/24 1345  Weight: 190 lb (86.2 kg)    GENERAL:alert, no distress and comfortable SKIN: skin color, texture, turgor are normal, no rashes or significant lesions EYES: normal, conjunctiva are pink and non-injected, sclera clear OROPHARYNX:no exudate, no erythema and  lips, buccal mucosa, and tongue normal  NECK: supple, thyroid normal size, non-tender, without nodularity LYMPH:  no palpable lymphadenopathy in the cervical, axillary or inguinal LUNGS: clear to auscultation and percussion with normal breathing effort HEART: regular rate & rhythm and no murmurs and no lower extremity edema ABDOMEN:abdomen soft, non-tender and normal bowel sounds Musculoskeletal:no cyanosis of digits and no clubbing  PSYCH: alert & oriented x 3 with fluent speech NEURO: no focal motor/sensory deficits  LABORATORY DATA:  I have reviewed the data as listed Lab Results  Component Value Date   WBC 6.6 08/15/2023   HGB 14.0 08/15/2023   HCT 42.0 08/15/2023   MCV 96 08/15/2023   PLT 319 08/15/2023   Recent Labs    08/15/23 1030  NA 138  K 4.6  CL 102  CO2 24  GLUCOSE 92  BUN 8  CREATININE 0.99  CALCIUM 9.9  PROT 7.1  ALBUMIN 4.6  AST 22  ALT 18  ALKPHOS 61  BILITOT 0.6    RADIOGRAPHIC STUDIES: I have personally reviewed the radiological images as listed and agreed with the findings in the report. DG BREAST SURGICAL SPECIMEN NO CHARGE Result Date: 12/23/2023 This procedure is a no report and no charge.  It will auto finalize.  DG BREAST SURGICAL SPECIMEN NO CHARGE Result Date: 12/23/2023 This procedure is a no report and no charge.  It will auto finalize.  NM Sentinel Node Inj-No Rpt (Breast) Result  Date: 12/22/2023 Sulfur Colloid was injected by the Nuclear Medicine Technologist for sentinel lymph node localization.   NM Sentinel Node Inj-No Rpt (Breast) Result Date: 12/22/2023 Sulfur Colloid was injected by the Nuclear Medicine Technologist for sentinel lymph node localization.

## 2024-01-20 NOTE — Progress Notes (Signed)
Met with Angelica Dougherty during follow up appt. With Dr. Alena Bills.

## 2024-01-20 NOTE — Consult Note (Signed)
NEW PATIENT EVALUATION  Name: Angelica Dougherty  MRN: [id number]  Date:   01/20/2024     DOB: 1971-05-11   This 53 y.o. 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  REFERRING PHYSICIAN: Michaelyn Barter, MD  CHIEF COMPLAINT: No chief complaint on file.   DIAGNOSIS: The encounter diagnosis was Malignant neoplasm of upper-outer quadrant of left female breast, unspecified estrogen receptor status (HCC).   PREVIOUS INVESTIGATIONS:  Mammogram and ultrasound and MRI scans reviewed Pathology reports reviewed Clinical notes reviewed  HPI: Patient is a 53 year old female who presented with an abnormal mammogram of her bilateral breasts.  The left breast showed a 2.1 cm mass in the left breast 11:30 position.  There are also suspicious linear calcifications in the upper central right breast.  No left axillary lymphadenopathy was identified.  She underwent biopsy showing DCIS of the upper inner quadrant of the right breast as well as multifocal other areas of suspicion.  In the left breast she had an indeterminate 1.2 cm mass in the left breast at posterior depth.  There are also 2's foci of indeterminate linear mass enhancement the left breast.  Biopsy showed DCIS intermediate grade of the right breast and again left breast showed invasive mammary carcinoma with focal lobular features.  Patient opted for bilateral mastectomies.  The left breast showed invasive ductal carcinoma with focal lobular features with 1 lymph node positive out of 6 for isolated tumor cells..  Tumor was ER/PR positive HER2/neu not overexpressed.  Tumor grade was overall 3.  The margin was less than 1 mm.  Right breast mastectomy specimen showed invasive carcinoma with mixed ductal and lobular features.  Tumor grade was overall 3 and tumor size of 1.2 mm.   All margins were negative closest margin 3 mm.  3 lymph nodes were examined none showing metastatic disease.  Again tumor was ER/PR positive HER2/neu not overexpressed.  She has had tissue expanders in both breasts and being in prepared for breast reconstruction with implants.  Her Oncotype DX is still pending.  She is now referred to radiation oncology for opinion.  She has been presented at our weekly tumor board and recommendation for adjuvant radiation therapy based on close margins multifocal disease as well as 1 sentinel node with isolated tumor cells.  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    PAST SURGICAL HISTORY:  Past Surgical History:  Procedure Laterality Date   ABDOMINAL HYSTERECTOMY  06/2006   Total hysterctomy excessive bleeding and fibroids per patient report. [facility]   BREAST BIOPSY Right 09/25/2023   stereo bx, calcs, RIBBON clip-path pending   BREAST BIOPSY Left 09/25/2023   [place] bx, savi tag as marker, path pending   BREAST BIOPSY Right 09/25/2023   MM RT BREAST BX W LOC DEV 1ST LESION IMAGE BX SPEC STEREO GUIDE 09/25/2023 [facility]   BREAST BIOPSY Left 09/25/2023   [place] LT BREAST BX W LOC DEV 1ST LESION IMG BX SPEC US GUIDE 09/25/2023 [facility]   BREAST EXCISIONAL BIOPSY Right 1993   benign   BREAST LUMPECTOMY  Right 1991   benign   BREAST RECONSTRUCTION WITH PLACEMENT OF TISSUE EXPANDER AND FLEX HD (ACELLULAR HYDRATED DERMIS) Bilateral 12/22/2023   Procedure: BREAST RECONSTRUCTION WITH PLACEMENT OF TISSUE EXPANDER AND FLEX HD (ACELLULAR HYDRATED DERMIS);  Surgeon: Peggye Form, DO;   Location: [facility];  Service: Plastics;  Laterality: Bilateral;   CESAREAN SECTION  12/16/2005   MASTECTOMY W/ SENTINEL NODE BIOPSY Bilateral 12/22/2023   Procedure: MASTECTOMY WITH SENTINEL LYMPH NODE BIOPSY, simple mastectomy;  Surgeon: Kandis Cocking, MD;  Location: [facility];  Service: General;  Laterality: Bilateral;    FAMILY HISTORY: family history includes Asthma in her mother; Breast cancer in her cousin, paternal aunt, and paternal aunt; Cancer in her maternal uncle.  SOCIAL HISTORY:  reports that she has never smoked. She has never been exposed to tobacco smoke. She has never used smokeless tobacco. She reports that she does not drink alcohol and does not use drugs.  ALLERGIES: Codeine  MEDICATIONS:  Current Outpatient Medications  Medication Sig Dispense Refill   acetaminophen (TYLENOL) 500 MG tablet Take 500 mg by mouth every 6 (six) hours as needed.     diazepam (VALIUM) 2 MG tablet Take 1 tablet (2 mg total) by mouth every 12 (twelve) hours as needed for muscle spasms. 20 tablet 0   diphenhydrAMINE (BENADRYL) 25 MG tablet Take 25 mg by mouth daily as needed for allergies.     ondansetron (ZOFRAN) 4 MG tablet Take 1 tablet (4 mg total) by mouth every 8 (eight) hours as needed for nausea or vomiting. 20 tablet 0   No current facility-administered medications for this encounter.    ECOG PERFORMANCE STATUS:  0 - Asymptomatic  REVIEW OF SYSTEMS: Patient denies any weight loss, fatigue, weakness, fever, chills or night sweats. Patient denies any loss of vision, blurred vision. Patient denies any ringing  of the ears or hearing loss. No irregular heartbeat. Patient denies heart murmur or history of fainting. Patient denies any chest pain or pain radiating to her upper extremities. Patient denies any shortness of breath, difficulty breathing at night, cough or hemoptysis. Patient denies any swelling in the lower legs. Patient denies any nausea vomiting, vomiting of blood, or coffee  ground material in the vomitus. Patient denies any stomach pain. Patient states has had normal bowel movements no significant constipation or diarrhea. Patient denies any dysuria, hematuria or significant nocturia. Patient denies any problems walking, swelling in the joints or loss of balance. Patient denies any skin changes, loss of hair or loss of weight. Patient denies any excessive worrying or anxiety or significant depression. Patient denies any problems with insomnia. Patient denies excessive thirst, polyuria, polydipsia. Patient denies any swollen glands, patient denies easy bruising or easy bleeding. Patient denies any recent infections, allergies or URI. Patient "s visual fields have not changed significantly in recent time.   PHYSICAL EXAM: LMP 06/08/2006 (Approximate) Comment: Hysterectomy Patient has bilateral tissue expanders present.  No dominant masses noted in either chest wall.  No axillary or supraclavicular adenopathy is identified.  Well-developed well-nourished patient in NAD. HEENT reveals PERLA, EOMI, discs not visualized.  Oral cavity is clear. No oral mucosal lesions are identified. Neck is clear without evidence of cervical or supraclavicular adenopathy. Lungs are clear to A&P. Cardiac examination is essentially unremarkable with regular rate and rhythm without murmur rub or thrill. Abdomen is benign with no organomegaly or masses noted. Motor sensory and DTR levels are equal and symmetric in the upper and lower extremities. Cranial nerves II through  XII are grossly intact. Proprioception is intact. No peripheral adenopathy or edema is identified. No motor or sensory levels are noted. Crude visual fields are within normal range.  LABORATORY DATA: Pathology reports reviewed    RADIOLOGY RESULTS: Mammogram ultrasound and MRI scans reviewed compatible with above-stated findings   IMPRESSION: Left mastectomy with multifocal disease but close margins and 1 sentinel node with isolated  tumor cells in 53 year old female with invasive mammary carcinoma with lobular features  PLAN: At this time we are waiting on Oncotype DX to determine whether she will need chemotherapy.  Based on the invasive mammary carcinoma with lobular features I would assume this will probably not benefit from chemotherapy.  Will wait for those results before proceeding with treatment.  I have recommended left chest wall and peripheral lymphatic radiation therapy 5040 cGy to both areas.  Risks and benefits of treatment occluding skin reaction fatigue alteration blood counts possible inclusion of superficial lung all were discussed in detail.  Based on her age I will ask her to do breath-hold technique to spare her cardiac tissue.  Patient comprehends my recommendations well if tentatively set up simulation for next week hopefully will have Oncotype DX back by then.  I will also drop a note to Dr. Drake Leach him to make sure it is all right to proceed with radiation prior to her breast implants being placed.  I would like to take this opportunity to thank you for allowing me to participate in the care of your patient.[name], MD

## 2024-01-21 ENCOUNTER — Other Ambulatory Visit: Payer: Self-pay | Admitting: Plastic Surgery

## 2024-01-21 ENCOUNTER — Encounter: Payer: Self-pay | Admitting: Occupational Therapy

## 2024-01-21 ENCOUNTER — Ambulatory Visit: Payer: BC Managed Care – PPO | Attending: Internal Medicine | Admitting: Occupational Therapy

## 2024-01-21 DIAGNOSIS — R293 Abnormal posture: Secondary | ICD-10-CM | POA: Diagnosis not present

## 2024-01-21 DIAGNOSIS — M25612 Stiffness of left shoulder, not elsewhere classified: Secondary | ICD-10-CM | POA: Diagnosis not present

## 2024-01-21 DIAGNOSIS — Z9013 Acquired absence of bilateral breasts and nipples: Secondary | ICD-10-CM

## 2024-01-21 DIAGNOSIS — D0511 Intraductal carcinoma in situ of right breast: Secondary | ICD-10-CM

## 2024-01-21 DIAGNOSIS — M25611 Stiffness of right shoulder, not elsewhere classified: Secondary | ICD-10-CM | POA: Diagnosis not present

## 2024-01-21 LAB — ESTRADIOL: Estradiol: 115 pg/mL

## 2024-01-21 LAB — FSH/LH
FSH: 13.6 m[IU]/mL
LH: 9.7 m[IU]/mL

## 2024-01-21 NOTE — Progress Notes (Signed)
Order for surgery

## 2024-01-21 NOTE — Therapy (Signed)
OUTPATIENT OCCUPATIONAL THERAPY BREAST CANCER POST OP VISIT   Patient Name: Angelica Dougherty MRN: [id number] DOB:11/29/1971, 53 y.o., female Today's Date: 01/21/2024  END OF SESSION:  OT End of Session - 01/21/24 1910     Visit Number 3    Number of Visits 12    Date for OT Re-Evaluation 02/18/24    OT Start Time 0904    OT Stop Time 0933    OT Time Calculation (min) 29 min    Activity Tolerance Patient tolerated treatment well    Behavior During Therapy [facility] for tasks assessed/performed             Past Medical History:  Diagnosis Date   Allergy    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 [facility]   BREAST BIOPSY Right 09/25/2023   stereo bx, calcs, RIBBON clip-path pending   BREAST BIOPSY Left 09/25/2023   [place] bx, savi tag as marker, path pending   BREAST BIOPSY Right 09/25/2023   MM RT BREAST BX W LOC DEV 1ST LESION IMAGE BX SPEC STEREO GUIDE 09/25/2023 [facility]   BREAST BIOPSY Left 09/25/2023   [place] LT BREAST BX W LOC DEV 1ST LESION IMG BX SPEC US GUIDE 09/25/2023 [facility]   BREAST EXCISIONAL BIOPSY Right 1993   benign   BREAST LUMPECTOMY Right 1991   benign   BREAST RECONSTRUCTION WITH PLACEMENT OF TISSUE EXPANDER AND FLEX HD (ACELLULAR HYDRATED DERMIS) Bilateral 12/22/2023   Procedure: BREAST RECONSTRUCTION WITH PLACEMENT OF TISSUE EXPANDER AND FLEX HD (ACELLULAR HYDRATED DERMIS);  Surgeon: Peggye Form, DO;  Location: [facility];  Service: Plastics;  Laterality: Bilateral;   CESAREAN SECTION  12/16/2005   MASTECTOMY W/ SENTINEL NODE BIOPSY Bilateral  12/22/2023   Procedure: MASTECTOMY WITH SENTINEL LYMPH NODE BIOPSY, simple mastectomy;  Surgeon: Kandis Cocking, MD;  Location: [facility];  Service: General;  Laterality: Bilateral;   Patient Active Problem List   Diagnosis Date Noted   Breast cancer, right (HCC) 01/20/2024   S/P bilateral mastectomy 12/22/2023   Genetic testing 10/28/2023   Breast cancer, left (HCC) 09/30/2023   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   Annual physical exam 08/15/2023   Screening mammogram for breast cancer 08/15/2023   Screen for colon cancer 08/15/2023   Post-menopausal 08/15/2023   HLD (hyperlipidemia) 08/03/2007    PCP: Suzie Portela NP  REFERRING PROVIDER: DR Geryl Councilman DIAG: Breast CA with bil lumpectomy  THERAPY DIAG:  Abnormal posture  Shoulder joint stiffness, bilateral  Rationale for Evaluation and Treatment: Rehabilitation  ONSET DATE: 09/30/23  SUBJECTIVE:  SUBJECTIVE STATEMENT: I had my bilateral mastectomy with placement of expanders done 12/22/2023.  The last drain got removed 01/13/2024.  I done some of the exercises.  But this 1 area where the last drain was pulled under my right breast is really pain at times.  And then it just feels really tight and a lot of discomfort.   PERTINENT HISTORY:  Patient was diagnosed with bilateral  breast cancer -patient had bilateral mastectomy with same time reconstruction with expanders by Dr. Dillingham-12/22/2023 surgery was done.  With the last visit no expansion was done.  3 lymph nodes removed on the right and 6 lymph nodes removed from the left PATIENT GOALS:   reduce lymphedema risk and learn post op HEP.   PAIN:  Are you having pain? No  PRECAUTIONS: Active CA     HAND DOMINANCE: left  WEIGHT BEARING RESTRICTIONS: No  FALLS:  Has patient  fallen in last 6 months? 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   LEISURE: read , spend time with daughter    OBJECTIVE:  COGNITION: Overall cognitive status: Within functional limits for tasks assessed    POSTURE:  Forward head and rounded shoulders posture  UPPER EXTREMITY AROM/PROM:  A/PROM RIGHT   eval  R 01/21/24  Shoulder extension    Shoulder flexion 165 112  Shoulder abduction 165 95  Shoulder internal rotation    Shoulder external rotation 85 80    (Blank rows = not tested)  A/PROM LEFT   eval L 01/21/24  Shoulder extension    Shoulder flexion 165 108  Shoulder abduction 165 90  Shoulder internal rotation    Shoulder external rotation 85 75    (Blank rows = not tested)  CERVICAL AROM: All within normal limits:     UPPER EXTREMITY STRENGTH: Not tested patient is about 4 weeks postop  LYMPHEDEMA ASSESSMENTS:   LANDMARK RIGHT   01/21/24  10 cm proximal to olecranon process 28.2  Olecranon process 26.5  10 cm proximal to ulnar styloid process 22.1 at 15 cm   Just proximal to ulnar styloid process   Across hand at thumb web space   At base of 2nd digit   (Blank rows = not tested)  LANDMARK LEFT   01/21/24  10 cm proximal to olecranon process 28.5  Olecranon process 26.5  10 cm proximal to ulnar styloid process 22.4 at 15 cm   Just proximal to ulnar styloid process   Across hand at thumb web space   At base of 2nd digit   (Blank rows = not tested)  L-DEX LYMPHEDEMA SCREENING:  The patient was assessed using the L-Dex machine today to produce a lymphedema index baseline score. The patient will be reassessed on a regular basis (typically every 3 months) to obtain new L-Dex scores. If the score is > 6.5 points away from his/her baseline score indicating onset of subclinical lymphedema, it will be recommended to wear a compression garment for 4 weeks, 12  hours per day and then be reassessed. If the score continues to be > 6.5 points from baseline at reassessment, we will initiate lymphedema treatment. Assessing in this manner has a 95% rate of preventing clinically significant lymphedema.  5.6 score - L UE at risk  R UE dominant - no lymph nodes remove L-Dex score at baseline NOTE DONE - Pt ended up had lymph node removed on both sides.  PATIENT EDUCATION:  Education details: Lymphedema risk reduction and post op shoulder/posture HEP Person educated: Patient and daughter Education method: Explanation, Demonstration, Handout Education comprehension: Patient verbalized understanding and returned demonstration  HOME EXERCISE PROGRAM: Active assisted range of motion pain-free in supine for shoulder flexion and abduction using a wand 12 reps 3 times a day pain-free.  As well as external rotation in supine using gravity.  Slight pull pain-free 3 times a day 12 reps.  To be done separately. Scapular squeezes.  As well as shoulder rolls.   ASSESSMENT:  CLINICAL IMPRESSION: Patient presented at follow-up for OT postop 3 to 4 weeks bilateral mastectomy with immediate reconstruction and expanders placement.  Patient had 3 lymph nodes removed from the right and 6 on the left.  Surgery was done 12/22/2023.  Patient present at OT follow-up with decreased range of motion in bilateral shoulder flexion and abduction external rotation with the left worse than the right.  Patient with discomfort more than pain.  Patient do report sharp or shooting pain at times at her right groin hold under breast.  Patient was educated in scar massage as well as upgraded her home exercises and reviewed again active assisted range of motion in supine using gravity for abduction and flexion with external rotation.  Keeping pain under 2/10.  Patient can benefit from skilled OT services to increase motion and strength in bilateral upper extremity and monitor her for lymphedema  symptoms and signs.  To return to prior level of function.    Pt will benefit from skilled therapeutic intervention to improve on the following deficits: Decreased knowledge of precautions and lymphedema education, impaired UE functional use, pain, decreased ROM, postural dysfunction.   OT treatment/interventions: ADL/self-care home management, pt/family education, therapeutic exercise,manual therapy  REHAB POTENTIAL: Good  CLINICAL DECISION MAKING: Stable/uncomplicated  EVALUATION COMPLEXITY: Low   GOALS: Goals reviewed with patient? YES  LONG TERM GOALS: (STG=LTG)    Name Target Date Goal status  1 Pt will be able to verbalize understanding of pertinent lymphedema risk reduction practices relevant to her dx specifically related to skin care.  Baseline:  No knowledge 6 wks ONGOING  2 Pt will be able to return demo and/or verbalize understanding of the post op HEP related to regaining shoulder ROM. Baseline:  No knowledge MET Achieved at eval       4 Pt will demo she has regained full shoulder ROM and function post operatively compared to baselines.  Baseline: See objective measurements taken today. 12 wks  ONGOING     PLAN:  OT FREQUENCY/DURATION: EVAL and  1-2 x wk for 6 wks .      Patient will follow up at outpatient cancer rehab 3-4 weeks following surgery.  If the patient requires occupational therapy at that time, a specific plan will be dictated and sent to the referring physician for approval.  Occupational Therapy Information for After Breast Cancer Surgery/Treatment:  Lymphedema is a swelling condition that you may be at risk for in your arm if you have lymph nodes removed from the armpit area.  After a sentinel node biopsy, the risk is approximately 5-9% and is higher after an axillary node dissection.  There is treatment available for this condition and it is not life-threatening.  Contact your physician or occupational therapist with concerns. You may begin the 4  shoulder/posture exercises (see additional sheet) when permitted by your physician (typically a week after surgery).  If you have drains, you may need to wait until those  are removed before beginning range of motion exercises.  A general recommendation is to not lift your arms above shoulder height until drains are removed.  These exercises should be done to your tolerance and gently.  This is not a "no pain/no gain" type of recovery so listen to your body and stretch into the range of motion that you can tolerate, stopping if you have pain.  If you are having immediate reconstruction, ask your plastic surgeon about doing exercises as he or she may want you to wait. We encourage you to watch the ABC (After Breast Cancer)  video. You will learn information related to lymphedema risk, prevention and treatment and additional exercises to regain mobility following surgery.  While undergoing any medical procedure or treatment, try to avoid blood pressure being taken or needle sticks from occurring on the arm on the side of cancer.   This recommendation begins after surgery and continues for the rest of your life.  This may help reduce your risk of getting lymphedema (swelling in your arm). An excellent resource for those seeking information on lymphedema is the National Lymphedema Network's web site. It can be accessed at www.lymphnet.org If you notice swelling in your hand, arm or breast at any time following surgery (even if it is many years from now), please contact your doctor or occupational therapist to discuss this.  Lymphedema can be treated at any time but it is easier for you if it is treated early on.  If you feel like your shoulder motion is not returning to normal in a reasonable amount of time, please contact your surgeon or occupational therapist.  [facility] [phone]. [address] [place], [place] [address]      Oletta Cohn, OTR/L,CLT 01/21/2024, 7:12  PM

## 2024-01-21 NOTE — Telephone Encounter (Signed)
Returned call to exact sciences and confirmed that multiple primaries are ordered to be tested.  If the first primary comes back with a low score they will automatically test 2nd primary, if 1st primary is a high score they will check with our office to see if medically necessary to check 2nd primary.   Testing may in fact take longer then the original ERD of 2/13 and 2/20 but they have the specimens and will move forward with the testing.

## 2024-01-22 ENCOUNTER — Ambulatory Visit (INDEPENDENT_AMBULATORY_CARE_PROVIDER_SITE_OTHER): Payer: BC Managed Care – PPO | Admitting: Surgical

## 2024-01-22 ENCOUNTER — Encounter: Payer: Self-pay | Admitting: Surgical

## 2024-01-22 VITALS — BP 146/72 | HR 74 | Ht 73.0 in | Wt 190.0 lb

## 2024-01-22 DIAGNOSIS — Z9013 Acquired absence of bilateral breasts and nipples: Secondary | ICD-10-CM

## 2024-01-22 DIAGNOSIS — Z170 Estrogen receptor positive status [ER+]: Secondary | ICD-10-CM

## 2024-01-22 DIAGNOSIS — D0511 Intraductal carcinoma in situ of right breast: Secondary | ICD-10-CM

## 2024-01-22 DIAGNOSIS — C50911 Malignant neoplasm of unspecified site of right female breast: Secondary | ICD-10-CM

## 2024-01-22 NOTE — Progress Notes (Signed)
Patient is a very pleasant 53 year old female here for follow-up on her bilateral breast reconstruction.  She was scheduled to follow-up next week, however was scheduled to come in today for expander fill due to possible need for radiation soon.  She saw a radiation oncology, we received secure chat from radiation oncology that patient will need radiation once she is completely expanded.  Patient reports today that she subsequently spoke with oncology and they will plan to begin radiation after implant exchange.  Patient reports has been feeling well, pain has been much better.  She is not having any specific concerns or issues at this time.  She is not having any infectious symptoms.  Chaperone present on exam On exam bilateral mastectomy flaps are viable, bilateral breast incisions are overall intact and appear to be healing well.  She does have minor separation along the medial left breast that is approximately 0.1 x 1 cm.  There is no surrounding erythema or cellulitic changes.  Expander is not exposed.  No subcutaneous fluid collection noted.  No erythema or cellulitic changes noted of either breast.  A/P:  Patient is overall doing really well, we discussed recommendation for Vaseline and gauze to left breast wound daily.  Discussed with her that it is not full-thickness and appears to be healing well, recommend keeping it covered and closely monitoring.  We placed injectable saline in the Expander using a sterile technique: Right: 50 cc for a total of 350 / 535 cc Left: 50 cc for a total of 350 / 535 cc  No signs infection or concern on exam.  Recommend following up next week.

## 2024-01-23 ENCOUNTER — Ambulatory Visit: Payer: BC Managed Care – PPO | Admitting: Occupational Therapy

## 2024-01-23 DIAGNOSIS — M25611 Stiffness of right shoulder, not elsewhere classified: Secondary | ICD-10-CM | POA: Diagnosis not present

## 2024-01-23 DIAGNOSIS — M25612 Stiffness of left shoulder, not elsewhere classified: Secondary | ICD-10-CM | POA: Diagnosis not present

## 2024-01-23 DIAGNOSIS — R293 Abnormal posture: Secondary | ICD-10-CM | POA: Diagnosis not present

## 2024-01-23 NOTE — Therapy (Signed)
 OUTPATIENT OCCUPATIONAL THERAPY BREAST CANCER POST OP VISIT   Patient Name: Angelica Dougherty MRN: [id number] DOB:1971-03-23, 53 y.o., female Today's Date: 01/23/2024  END OF SESSION:  OT End of Session - 01/23/24 0851     Visit Number 4    Number of Visits 12    Date for OT Re-Evaluation 02/18/24    OT Start Time 0852    OT Stop Time 0933    OT Time Calculation (min) 41 min    Activity Tolerance Patient tolerated treatment well    Behavior During Therapy [facility] for tasks assessed/performed             Past Medical History:  Diagnosis Date   Allergy    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 [place]   BREAST BIOPSY Right 09/25/2023   stereo bx, calcs, RIBBON clip-path pending   BREAST BIOPSY Left 09/25/2023   [place] bx, savi tag as marker, path pending   BREAST BIOPSY Right 09/25/2023   MM RT BREAST BX W LOC DEV 1ST LESION IMAGE BX SPEC STEREO GUIDE 09/25/2023 [facility]   BREAST BIOPSY Left 09/25/2023   [place] LT BREAST BX W LOC DEV 1ST LESION IMG BX SPEC US GUIDE 09/25/2023 [facility]   BREAST EXCISIONAL BIOPSY Right 1993   benign   BREAST LUMPECTOMY Right 1991   benign   BREAST RECONSTRUCTION WITH PLACEMENT OF TISSUE EXPANDER AND FLEX HD (ACELLULAR HYDRATED DERMIS) Bilateral 12/22/2023   Procedure: BREAST RECONSTRUCTION WITH PLACEMENT OF TISSUE EXPANDER AND FLEX HD (ACELLULAR HYDRATED DERMIS);  Surgeon: Peggye Form, DO;  Location: [facility];  Service: Plastics;  Laterality: Bilateral;   CESAREAN SECTION  12/16/2005   MASTECTOMY W/ SENTINEL NODE BIOPSY Bilateral  12/22/2023   Procedure: MASTECTOMY WITH SENTINEL LYMPH NODE BIOPSY, simple mastectomy;  Surgeon: Kandis Cocking, MD;  Location: [facility];  Service: General;  Laterality: Bilateral;   Patient Active Problem List   Diagnosis Date Noted   Breast cancer, right (HCC) 01/20/2024   S/P bilateral mastectomy 12/22/2023   Genetic testing 10/28/2023   Breast cancer, left (HCC) 09/30/2023   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   Annual physical exam 08/15/2023   Screening mammogram for breast cancer 08/15/2023   Screen for colon cancer 08/15/2023   Post-menopausal 08/15/2023   HLD (hyperlipidemia) 08/03/2007    PCP: Suzie Portela NP  REFERRING PROVIDER: DR Geryl Councilman DIAG: Breast CA with bil lumpectomy  THERAPY DIAG:  Abnormal posture  Shoulder joint stiffness, bilateral  Rationale for Evaluation and Treatment: Rehabilitation  ONSET DATE: 09/30/23  SUBJECTIVE:  SUBJECTIVE STATEMENT: I did the exercises and doing better- less pain - had filling yesterday again so just really tight and uncomfortable PERTINENT HISTORY:  Patient was diagnosed with bilateral  breast cancer -patient had bilateral mastectomy with same time reconstruction with expanders by Dr. Dillingham-12/22/2023 surgery was done.  With the last visit no expansion was done.  3 lymph nodes removed on the right and 6 lymph nodes removed from the left PATIENT GOALS:   reduce lymphedema risk and learn post op HEP.   PAIN:  Are you having pain? No  PRECAUTIONS: Active CA     HAND DOMINANCE: left  WEIGHT BEARING RESTRICTIONS: No  FALLS:  Has patient fallen in last 6 months? 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   LEISURE: read ,  spend time with daughter    OBJECTIVE:  COGNITION: Overall cognitive status: Within functional limits for tasks assessed    POSTURE:  Forward head and rounded shoulders posture  UPPER EXTREMITY AROM/PROM:  A/PROM RIGHT   eval  R 01/21/24 R 01/23/24  Shoulder extension     Shoulder flexion 165 112 112  Shoulder abduction 165 95 90  Shoulder internal rotation     Shoulder external rotation 85 80 85    (Blank rows = not tested)  A/PROM LEFT   eval L 01/21/24 L 01/23/24  Shoulder extension     Shoulder flexion 165 108 90( end of session 100)  Shoulder abduction 165 90 70( end of session 90)  Shoulder internal rotation     Shoulder external rotation 85 75 80    (Blank rows = not tested)  CERVICAL AROM: All within normal limits:     UPPER EXTREMITY STRENGTH: Not tested patient is about 4 weeks postop  LYMPHEDEMA ASSESSMENTS:   LANDMARK RIGHT   01/21/24  10 cm proximal to olecranon process 28.2  Olecranon process 26.5  10 cm proximal to ulnar styloid process 22.1 at 15 cm   Just proximal to ulnar styloid process   Across hand at thumb web space   At base of 2nd digit   (Blank rows = not tested)  LANDMARK LEFT   01/21/24  10 cm proximal to olecranon process 28.5  Olecranon process 26.5  10 cm proximal to ulnar styloid process 22.4 at 15 cm   Just proximal to ulnar styloid process   Across hand at thumb web space   At base of 2nd digit   (Blank rows = not tested)  L-DEX LYMPHEDEMA SCREENING:  The patient was assessed using the L-Dex machine today to produce a lymphedema index baseline score. The patient will be reassessed on a regular basis (typically every 3 months) to obtain new L-Dex scores. If the score is > 6.5 points away from his/her baseline score indicating onset of subclinical lymphedema, it will be recommended to wear a compression garment for 4 weeks, 12 hours per day and then be reassessed. If the score continues to be > 6.5 points from baseline at  reassessment, we will initiate lymphedema treatment. Assessing in this manner has a 95% rate of preventing clinically significant lymphedema.  5.6 score - L UE at risk  R UE dominant - no lymph nodes remove L-Dex score at baseline NOTE DONE - Pt ended up had lymph node removed on both sides.   PATIENT EDUCATION:  Education details: Lymphedema risk reduction and post op shoulder/posture HEP Person educated: Patient and  daughter Education method: Explanation, Demonstration, Handout Education comprehension: Patient verbalized understanding and returned demonstration  Patient arrived with decreased range of motion compared to earlier this week.  Patient had another expander filling yesterday. Patient still reports pain below right breast at scars. Reviewed with patient supine active assisted range of motion using wand for shoulder flexion and abduction.  Needed min assist. Reviewed external rotation doing really well progressed or maintained progressed since last time. Done 15 reps of each Followed by 10 wall slides for shoulder flexion and abduction. Done pulleys with patient for shoulder flexion and abduction active assisted range of motion. Add for patient for home exercises 20 reps each to 3 times a day in the place of supine exercises.  Stop when feeling a pull no pain.   ASSESSMENT:  CLINICAL IMPRESSION: Patient presented at follow-up for OT postop  4 weeks bilateral mastectomy with immediate reconstruction and expanders placement.  Patient had 3 lymph nodes removed from the right and 6 on the left.  Surgery was done 12/22/2023.  Patient had expander filling yesterday.  Patient was able to maintain external rotation progress since earlier this week.  Showed decreased left shoulder flexion abduction as well as right shoulder abduction.  Patient continues to report sharp or shooting pain at times under R breast where drains were.  Patient was educated in scar massage as well as upgraded.   Reviewed home exercises with patient and upgraded and added pulleys for another option for shoulder flexion and abduction.  Patient tolerated very well was able to pain-free and show improvement in session.    Patient can benefit from skilled OT services to increase motion and strength in bilateral upper extremity and monitor her for lymphedema symptoms and signs.  To return to prior level of function.    Pt will benefit from skilled therapeutic intervention to improve on the following deficits: Decreased knowledge of precautions and lymphedema education, impaired UE functional use, pain, decreased ROM, postural dysfunction.   OT treatment/interventions: ADL/self-care home management, pt/family education, therapeutic exercise,manual therapy  REHAB POTENTIAL: Good  CLINICAL DECISION MAKING: Stable/uncomplicated  EVALUATION COMPLEXITY: Low   GOALS: Goals reviewed with patient? YES  LONG TERM GOALS: (STG=LTG)    Name Target Date Goal status  1 Pt will be able to verbalize understanding of pertinent lymphedema risk reduction practices relevant to her dx specifically related to skin care.  Baseline:  No knowledge 6 wks ONGOING  2 Pt will be able to return demo and/or verbalize understanding of the post op HEP related to regaining shoulder ROM. Baseline:  No knowledge MET Achieved at eval       4 Pt will demo she has regained full shoulder ROM and function post operatively compared to baselines.  Baseline: See objective measurements taken today. 12 wks  ONGOING     PLAN:  OT FREQUENCY/DURATION: EVAL and  1-2 x wk for 6 wks .      Patient will follow up at outpatient cancer rehab 3-4 weeks following surgery.  If the patient requires occupational therapy at that time, a specific plan will be dictated and sent to the referring physician for approval.  Occupational Therapy Information for After Breast Cancer Surgery/Treatment:  Lymphedema is a swelling condition that you may be at risk  for in your arm if you have lymph nodes removed from the armpit area.  After a sentinel node biopsy, the risk is approximately 5-9% and is higher after an axillary node dissection.  There is treatment available for  this condition and it is not life-threatening.  Contact your physician or occupational therapist with concerns. You may begin the 4 shoulder/posture exercises (see additional sheet) when permitted by your physician (typically a week after surgery).  If you have drains, you may need to wait until those are removed before beginning range of motion exercises.  A general recommendation is to not lift your arms above shoulder height until drains are removed.  These exercises should be done to your tolerance and gently.  This is not a "no pain/no gain" type of recovery so listen to your body and stretch into the range of motion that you can tolerate, stopping if you have pain.  If you are having immediate reconstruction, ask your plastic surgeon about doing exercises as he or she may want you to wait. We encourage you to watch the ABC (After Breast Cancer)  video. You will learn information related to lymphedema risk, prevention and treatment and additional exercises to regain mobility following surgery.  While undergoing any medical procedure or treatment, try to avoid blood pressure being taken or needle sticks from occurring on the arm on the side of cancer.   This recommendation begins after surgery and continues for the rest of your life.  This may help reduce your risk of getting lymphedema (swelling in your arm). An excellent resource for those seeking information on lymphedema is the National Lymphedema Network's web site. It can be accessed at www.lymphnet.org If you notice swelling in your hand, arm or breast at any time following surgery (even if it is many years from now), please contact your doctor or occupational therapist to discuss this.  Lymphedema can be treated at any time but it is  easier for you if it is treated early on.  If you feel like your shoulder motion is not returning to normal in a reasonable amount of time, please contact your surgeon or occupational therapist.  [facility] [phone]. [address] [place], [place] [address]      Oletta Cohn, OTR/L,CLT 01/23/2024, 9:40 AM

## 2024-01-26 ENCOUNTER — Ambulatory Visit: Payer: BC Managed Care – PPO | Admitting: Occupational Therapy

## 2024-01-26 DIAGNOSIS — M25611 Stiffness of right shoulder, not elsewhere classified: Secondary | ICD-10-CM | POA: Diagnosis not present

## 2024-01-26 DIAGNOSIS — R293 Abnormal posture: Secondary | ICD-10-CM

## 2024-01-26 DIAGNOSIS — M25612 Stiffness of left shoulder, not elsewhere classified: Secondary | ICD-10-CM

## 2024-01-26 NOTE — Therapy (Signed)
 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 [place]   BREAST BIOPSY Right 09/25/2023   stereo bx, calcs, RIBBON clip-path pending   BREAST BIOPSY Left 09/25/2023   [place] bx, savi tag as marker, path pending   BREAST BIOPSY Right 09/25/2023   MM RT BREAST BX W LOC DEV 1ST LESION IMAGE BX SPEC STEREO GUIDE 09/25/2023 [facility]   BREAST BIOPSY Left 09/25/2023   [place] LT BREAST BX W LOC DEV 1ST LESION IMG BX SPEC US GUIDE 09/25/2023 [facility]   BREAST EXCISIONAL BIOPSY Right 1993   benign   BREAST LUMPECTOMY Right 1991   benign   BREAST RECONSTRUCTION WITH PLACEMENT OF TISSUE EXPANDER AND FLEX HD (ACELLULAR HYDRATED DERMIS) Bilateral 12/22/2023   Procedure: BREAST RECONSTRUCTION WITH PLACEMENT OF TISSUE EXPANDER AND FLEX HD (ACELLULAR HYDRATED DERMIS);  Surgeon: Peggye Form, DO;  Location: [facility];  Service: Plastics;  Laterality: Bilateral;   CESAREAN SECTION  12/16/2005   MASTECTOMY W/ SENTINEL NODE BIOPSY Bilateral 12/22/2023   Procedure: MASTECTOMY WITH SENTINEL LYMPH NODE BIOPSY, simple mastectomy;  Surgeon:  Kandis Cocking, MD;  Location: [facility];  Service: General;  Laterality: Bilateral;   Patient Active Problem List   Diagnosis Date Noted   Breast cancer, right (HCC) 01/20/2024   S/P bilateral mastectomy 12/22/2023   Genetic testing 10/28/2023   Breast cancer, left (HCC) 09/30/2023   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   Annual physical exam 08/15/2023   Screening mammogram for breast cancer 08/15/2023   Screen for colon cancer 08/15/2023   Post-menopausal 08/15/2023   HLD (hyperlipidemia) 08/03/2007    PCP: Suzie Portela NP  REFERRING PROVIDER: DR Geryl Councilman DIAG: Breast CA with bil lumpectomy  THERAPY DIAG:  Abnormal posture  Shoulder joint stiffness, bilateral  Rationale for Evaluation and Treatment: Rehabilitation  ONSET DATE: 09/30/23  SUBJECTIVE:  SUBJECTIVE STATEMENT: I just feel stiff all over - by body hurts- my car is low and just from that I was sore all over - my L arm doing not as good as my R -but done the exercises - incisions looking good PERTINENT HISTORY:  Patient was diagnosed with bilateral  breast cancer -patient had bilateral mastectomy with same time reconstruction with expanders by Dr. Dillingham-12/22/2023 surgery was done.  With the last visit no expansion was done.  3 lymph nodes removed on the right and 6 lymph nodes removed from the left PATIENT GOALS:   reduce lymphedema risk and learn post op HEP.   PAIN:  Are you having pain? No  PRECAUTIONS: Active CA     HAND DOMINANCE: left  WEIGHT BEARING RESTRICTIONS: No  FALLS:  Has patient fallen in last 6 months? 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   LEISURE: read , spend time with daughter     OBJECTIVE:  COGNITION: Overall cognitive status: Within functional limits for tasks assessed    POSTURE:  Forward head and rounded shoulders posture  UPPER EXTREMITY AROM/PROM:  A/PROM RIGHT   eval  R 01/21/24 R 01/23/24 R 01/26/24  Shoulder extension      Shoulder flexion 165 112 112 125  Shoulder abduction 165 95 90 95  Shoulder internal rotation      Shoulder external rotation 85 80 85 85    (Blank rows = not tested)  A/PROM LEFT   eval L 01/21/24 L 01/23/24 L 01/26/24  Shoulder extension      Shoulder flexion 165 108 90( end of session 100) 102  Shoulder abduction 165 90 70( end of session 90) 85  Shoulder internal rotation      Shoulder external rotation 85 75 80 80    (Blank rows = not tested)  CERVICAL AROM: All within normal limits:     UPPER EXTREMITY STRENGTH: Not tested patient is about 4 weeks postop  LYMPHEDEMA ASSESSMENTS:   LANDMARK RIGHT   01/21/24  10 cm proximal to olecranon process 28.2  Olecranon process 26.5  10 cm proximal to ulnar styloid process 22.1 at 15 cm   Just proximal to ulnar styloid process   Across hand at thumb web space   At base of 2nd digit   (Blank rows = not tested)  LANDMARK LEFT   01/21/24  10 cm proximal to olecranon process 28.5  Olecranon process 26.5  10 cm proximal to ulnar styloid process 22.4 at 15 cm   Just proximal to ulnar styloid process   Across hand at thumb web space   At base of 2nd digit   (Blank rows = not tested)  L-DEX LYMPHEDEMA SCREENING:  The patient was assessed using the L-Dex machine today to produce a lymphedema index baseline score. The patient will be reassessed on a regular basis (typically every 3 months) to obtain new L-Dex scores. If the score is > 6.5 points away from his/her baseline score indicating onset of subclinical lymphedema, it will be recommended to wear a compression garment for 4 weeks, 12 hours per day and then be reassessed. If the score continues to be > 6.5 points  from baseline at reassessment, we will initiate lymphedema treatment. Assessing in this manner has a 95% rate of preventing clinically significant lymphedema.  5.6 score - L UE at risk  R UE dominant - no lymph nodes remove  L-Dex score at baseline NOTE DONE - Pt ended up had lymph node removed on both sides.   PATIENT EDUCATION:  Education details: Lymphedema risk reduction and post op shoulder/posture HEP Person educated: Patient and daughter Education method: Explanation, Demonstration, Handout Education comprehension: Patient verbalized understanding and returned demonstration  Patient arrived with  increase range of motion compared to end of last week after her last expander fillings Patient  reports pain below right breast scar little better but still there Reviewed with patient supine  external rotation AROM and AAROM pain free Done 10 reps of each Review with pt  pulleys for shoulder flexion and abduction active assisted range of motion. REINFORCE NOT TO OVER DO AND PAIN FREE- and light do not tense 20 reps each to 3 times a day in the place of supine exercises.  Stop when feeling a pull no pain. Cervical AROM in all planes And then supine - hip lat flexion , and rotations - for stiffness  Deep abdominal breathing    ASSESSMENT:  CLINICAL IMPRESSION: Patient presented at follow-up for OT postop  4 weeks bilateral mastectomy with immediate reconstruction and expanders placement.  Patient had 3 lymph nodes removed from the right and 6 on the left.  Surgery was done 12/22/2023.  Patient had expander filling yesterday.  Patient was able to maintain external rotation progress since earlier this week.  Showed decreased left shoulder flexion abduction as well as right shoulder abduction.  Patient continues to report sharp or shooting pain at times under R breast where drains were- but less this date.  Pt show  progress compare to last week in shoulder flexion and ABD - Reinforce to do pain  free and light AAROM in supine or pulleys for another option for shoulder flexion and abduction.  Patient tolerated very well was able to pain-free and show improvement in session.  Address some walking and LE hip rotation to decrease stiffness.  Patient can benefit from skilled OT services to increase motion and strength in bilateral upper extremity and monitor her for lymphedema symptoms and signs.  To return to prior level of function.    Pt will benefit from skilled therapeutic intervention to improve on the following deficits: Decreased knowledge of precautions and lymphedema education, impaired UE functional use, pain, decreased ROM, postural dysfunction.   OT treatment/interventions: ADL/self-care home management, pt/family education, therapeutic exercise,manual therapy  REHAB POTENTIAL: Good  CLINICAL DECISION MAKING: Stable/uncomplicated  EVALUATION COMPLEXITY: Low   GOALS: Goals reviewed with patient? YES  LONG TERM GOALS: (STG=LTG)    Name Target Date Goal status  1 Pt will be able to verbalize understanding of pertinent lymphedema risk reduction practices relevant to her dx specifically related to skin care.  Baseline:  No knowledge 6 wks ONGOING  2 Pt will be able to return demo and/or verbalize understanding of the post op HEP related to regaining shoulder ROM. Baseline:  No knowledge MET Achieved at eval       4 Pt will demo she has regained full shoulder ROM and function post operatively compared to baselines.  Baseline: See objective measurements taken today. 12 wks  ONGOING     PLAN:  OT FREQUENCY/DURATION: EVAL and  1-2 x wk for 6 wks .      Patient will follow up at outpatient cancer rehab 3-4 weeks following surgery.  If the patient requires occupational therapy at that time, a specific plan will be dictated and sent to the referring physician for approval.  Occupational Therapy  Information for After Breast Cancer Surgery/Treatment:  Lymphedema is a swelling  condition that you may be at risk for in your arm if you have lymph nodes removed from the armpit area.  After a sentinel node biopsy, the risk is approximately 5-9% and is higher after an axillary node dissection.  There is treatment available for this condition and it is not life-threatening.  Contact your physician or occupational therapist with concerns. You may begin the 4 shoulder/posture exercises (see additional sheet) when permitted by your physician (typically a week after surgery).  If you have drains, you may need to wait until those are removed before beginning range of motion exercises.  A general recommendation is to not lift your arms above shoulder height until drains are removed.  These exercises should be done to your tolerance and gently.  This is not a "no pain/no gain" type of recovery so listen to your body and stretch into the range of motion that you can tolerate, stopping if you have pain.  If you are having immediate reconstruction, ask your plastic surgeon about doing exercises as he or she may want you to wait. We encourage you to watch the ABC (After Breast Cancer)  video. You will learn information related to lymphedema risk, prevention and treatment and additional exercises to regain mobility following surgery.  While undergoing any medical procedure or treatment, try to avoid blood pressure being taken or needle sticks from occurring on the arm on the side of cancer.   This recommendation begins after surgery and continues for the rest of your life.  This may help reduce your risk of getting lymphedema (swelling in your arm). An excellent resource for those seeking information on lymphedema is the National Lymphedema Network's web site. It can be accessed at www.lymphnet.org If you notice swelling in your hand, arm or breast at any time following surgery (even if it is many years from now), please contact your doctor or occupational therapist to discuss this.  Lymphedema can be  treated at any time but it is easier for you if it is treated early on.  If you feel like your shoulder motion is not returning to normal in a reasonable amount of time, please contact your surgeon or occupational therapist.  [facility] [phone]. [address] [place], [place] [address]      Oletta Cohn, OTR/L,CLT 01/26/2024, 1:17 PM

## 2024-01-27 ENCOUNTER — Encounter: Payer: Self-pay | Admitting: General Surgery

## 2024-01-27 ENCOUNTER — Encounter: Payer: BC Managed Care – PPO | Admitting: Surgical

## 2024-01-27 ENCOUNTER — Ambulatory Visit: Payer: BC Managed Care – PPO | Admitting: General Surgery

## 2024-01-27 ENCOUNTER — Encounter: Payer: BC Managed Care – PPO | Admitting: Occupational Therapy

## 2024-01-27 VITALS — BP 120/72 | HR 89 | Temp 98.0°F | Ht 73.0 in | Wt 187.0 lb

## 2024-01-27 DIAGNOSIS — Z08 Encounter for follow-up examination after completed treatment for malignant neoplasm: Secondary | ICD-10-CM

## 2024-01-27 DIAGNOSIS — Z170 Estrogen receptor positive status [ER+]: Secondary | ICD-10-CM

## 2024-01-27 DIAGNOSIS — C50412 Malignant neoplasm of upper-outer quadrant of left female breast: Secondary | ICD-10-CM

## 2024-01-27 NOTE — Patient Instructions (Addendum)
 We will have you follow up here in 3 month. We will send you a letter about this appointment.     Please call and ask to speak with a nurse if you develop questions or concerns.

## 2024-01-28 ENCOUNTER — Ambulatory Visit: Payer: BC Managed Care – PPO

## 2024-01-29 ENCOUNTER — Encounter: Payer: Self-pay | Admitting: *Deleted

## 2024-01-29 ENCOUNTER — Encounter: Payer: BC Managed Care – PPO | Admitting: Surgical

## 2024-01-29 ENCOUNTER — Encounter: Payer: BC Managed Care – PPO | Admitting: General Surgery

## 2024-01-29 ENCOUNTER — Encounter: Payer: Self-pay | Admitting: Internal Medicine

## 2024-01-29 DIAGNOSIS — C50412 Malignant neoplasm of upper-outer quadrant of left female breast: Secondary | ICD-10-CM | POA: Diagnosis not present

## 2024-01-29 DIAGNOSIS — Z170 Estrogen receptor positive status [ER+]: Secondary | ICD-10-CM | POA: Diagnosis not present

## 2024-01-29 NOTE — Progress Notes (Unsigned)
 Oncotype on left breast came back with score of 13.   Right breast specimen will now be tested.   Notified Angelica Dougherty of the first result and now we will wait for 2nd result.

## 2024-01-30 ENCOUNTER — Ambulatory Visit: Payer: BC Managed Care – PPO | Admitting: Occupational Therapy

## 2024-02-04 ENCOUNTER — Telehealth: Payer: Self-pay | Admitting: Surgical

## 2024-02-04 DIAGNOSIS — C50412 Malignant neoplasm of upper-outer quadrant of left female breast: Secondary | ICD-10-CM | POA: Diagnosis not present

## 2024-02-04 DIAGNOSIS — Z170 Estrogen receptor positive status [ER+]: Secondary | ICD-10-CM | POA: Diagnosis not present

## 2024-02-04 NOTE — Telephone Encounter (Signed)
 Called patient to discuss FMLA/STD forms. No answer.   She has appt tomorrow, will discuss then

## 2024-02-05 ENCOUNTER — Encounter: Payer: Self-pay | Admitting: Surgical

## 2024-02-05 ENCOUNTER — Ambulatory Visit (INDEPENDENT_AMBULATORY_CARE_PROVIDER_SITE_OTHER): Payer: BC Managed Care – PPO | Admitting: Surgical

## 2024-02-05 VITALS — BP 153/76 | HR 78 | Ht 73.0 in | Wt 190.0 lb

## 2024-02-05 DIAGNOSIS — D0511 Intraductal carcinoma in situ of right breast: Secondary | ICD-10-CM

## 2024-02-05 DIAGNOSIS — C50911 Malignant neoplasm of unspecified site of right female breast: Secondary | ICD-10-CM

## 2024-02-05 DIAGNOSIS — Z9013 Acquired absence of bilateral breasts and nipples: Secondary | ICD-10-CM

## 2024-02-05 NOTE — H&P (View-Only) (Signed)
 Patient is a very pleasant 53 year old female here for follow-up on her bilateral breast reconstruction.  She reports she is scheduled to see radiation oncology March 11 for an initial appointment to discuss radiation planning.  She reports today that she is overall happy with the current size of the expanders, but would like 1 additional fill.  She is not having any infectious symptoms.  She does report that as she increases her activity she has noticed some increased soreness but describes it as a soreness and not muscle spasms or postsurgical pain.  Chaperone present on exam On exam bilateral breast incisions are intact, expanders are in place, no subcutaneous fluid collection noted.  Symmetry is overall good. There is no erythema or cellulitic changes noted of either breast.  No tenderness noted of either breast.  A/P:  She is doing very well, there is no signs infection or concern on exam.  We will do 1 additional injection today and then plan for exchange.  We did discuss that we typically do not exchange until 3 months after initial expander placement, however due to the possibility for radiation to begin soon, will discuss with Dr. Ulice Bold possibility for exchanging sooner.  She is scheduled to see radiation oncology February 17, 2024 for consultation/visit to discuss.  We will fill out forms for her to remain out of work until March 10 for ongoing recovery.  We placed injectable saline in the Expander using a sterile technique: Right: 50 cc for a total of  400/535 cc Left: 50 cc for a total of 400 / 535 cc  Pictures were obtained of the patient and placed in the chart with the patient's or guardian's permission.

## 2024-02-05 NOTE — Progress Notes (Signed)
 Patient is a very pleasant 53 year old female here for follow-up on her bilateral breast reconstruction.  She reports she is scheduled to see radiation oncology March 11 for an initial appointment to discuss radiation planning.  She reports today that she is overall happy with the current size of the expanders, but would like 1 additional fill.  She is not having any infectious symptoms.  She does report that as she increases her activity she has noticed some increased soreness but describes it as a soreness and not muscle spasms or postsurgical pain.  Chaperone present on exam On exam bilateral breast incisions are intact, expanders are in place, no subcutaneous fluid collection noted.  Symmetry is overall good. There is no erythema or cellulitic changes noted of either breast.  No tenderness noted of either breast.  A/P:  She is doing very well, there is no signs infection or concern on exam.  We will do 1 additional injection today and then plan for exchange.  We did discuss that we typically do not exchange until 3 months after initial expander placement, however due to the possibility for radiation to begin soon, will discuss with Dr. Ulice Bold possibility for exchanging sooner.  She is scheduled to see radiation oncology February 17, 2024 for consultation/visit to discuss.  We will fill out forms for her to remain out of work until March 10 for ongoing recovery.  We placed injectable saline in the Expander using a sterile technique: Right: 50 cc for a total of  400/535 cc Left: 50 cc for a total of 400 / 535 cc  Pictures were obtained of the patient and placed in the chart with the patient's or guardian's permission.

## 2024-02-09 ENCOUNTER — Ambulatory Visit: Payer: BC Managed Care – PPO | Attending: Internal Medicine | Admitting: Occupational Therapy

## 2024-02-09 DIAGNOSIS — R293 Abnormal posture: Secondary | ICD-10-CM | POA: Insufficient documentation

## 2024-02-09 DIAGNOSIS — M25612 Stiffness of left shoulder, not elsewhere classified: Secondary | ICD-10-CM | POA: Insufficient documentation

## 2024-02-09 DIAGNOSIS — M25611 Stiffness of right shoulder, not elsewhere classified: Secondary | ICD-10-CM | POA: Diagnosis not present

## 2024-02-09 NOTE — Therapy (Signed)
 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 [facility]   BREAST BIOPSY Right 09/25/2023   stereo bx, calcs, RIBBON clip-path pending   BREAST BIOPSY Left 09/25/2023   [place] bx, savi tag as marker, path pending   BREAST BIOPSY Right 09/25/2023   MM RT BREAST BX W LOC DEV 1ST LESION IMAGE BX SPEC STEREO GUIDE 09/25/2023 [facility]   BREAST BIOPSY Left 09/25/2023   [place] LT BREAST BX W LOC DEV 1ST LESION IMG BX SPEC US GUIDE 09/25/2023 [facility]   BREAST EXCISIONAL BIOPSY Right 1993   benign   BREAST LUMPECTOMY Right 1991   benign   BREAST RECONSTRUCTION WITH PLACEMENT OF TISSUE EXPANDER AND FLEX HD (ACELLULAR HYDRATED DERMIS) Bilateral 12/22/2023   Procedure: BREAST RECONSTRUCTION WITH PLACEMENT OF TISSUE EXPANDER AND FLEX HD (ACELLULAR HYDRATED DERMIS);  Surgeon: Peggye Form, DO;  Location: [facility];  Service: Plastics;  Laterality: Bilateral;   CESAREAN SECTION  12/16/2005   MASTECTOMY W/ SENTINEL NODE BIOPSY Bilateral  12/22/2023   Procedure: MASTECTOMY WITH SENTINEL LYMPH NODE BIOPSY, simple mastectomy;  Surgeon: Kandis Cocking, MD;  Location: [facility];  Service: General;  Laterality: Bilateral;   Patient Active Problem List   Diagnosis Date Noted   Breast cancer, right (HCC) 01/20/2024   S/P bilateral mastectomy 12/22/2023   Genetic testing 10/28/2023   Breast cancer, left (HCC) 09/30/2023   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   Annual physical exam 08/15/2023   Screening mammogram for breast cancer 08/15/2023   Screen for colon cancer 08/15/2023   Post-menopausal 08/15/2023   HLD (hyperlipidemia) 08/03/2007    PCP: Suzie Portela NP  REFERRING PROVIDER: DR Geryl Councilman DIAG: Breast CA with bil lumpectomy  THERAPY DIAG:  Abnormal posture  Shoulder joint stiffness, bilateral  Rationale for Evaluation and Treatment: Rehabilitation  ONSET DATE: 09/30/23  SUBJECTIVE:  SUBJECTIVE STATEMENT: I just feel stiff all over - by body hurts- my car is low and just from that I was sore all over - my L arm doing not as good as my R -but done the exercises - incisions looking good PERTINENT HISTORY:  Patient was diagnosed with bilateral  breast cancer -patient had bilateral mastectomy with same time reconstruction with expanders by Dr. Dillingham-12/22/2023 surgery was done.  With the last visit no expansion was done.  3 lymph nodes removed on the right and 6 lymph nodes removed from the left PATIENT GOALS:   reduce lymphedema risk and learn post op HEP.   PAIN:  Are you having pain? No  PRECAUTIONS: Active CA     HAND DOMINANCE: left  WEIGHT BEARING RESTRICTIONS: No  FALLS:  Has patient fallen in last 6 months? 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   LEISURE: read , spend time with daughter    OBJECTIVE:  COGNITION: Overall cognitive status: Within functional limits for tasks assessed    POSTURE:  Forward head and rounded shoulders posture  UPPER EXTREMITY AROM/PROM:  A/PROM RIGHT   eval  R 01/21/24 R 01/23/24 R 01/26/24 R  02/09/24  Shoulder extension       Shoulder flexion 165 112 112 125 125 In session 150  Shoulder abduction 165 95 90 95 92 ( in session 109  Shoulder internal rotation       Shoulder external rotation 85 80 85 85 84    (Blank rows = not tested)  A/PROM LEFT   eval L 01/21/24 L 01/23/24 L 01/26/24 L 02/09/24  Shoulder extension       Shoulder flexion 165 108 90( end of session 100) 102 100 in session 115  Shoulder abduction 165 90 70( end of session 90) 85 82 in session 94  Shoulder internal rotation       Shoulder external rotation 85 75 80 80 72    (Blank rows = not tested)  CERVICAL AROM: All within normal limits:     UPPER EXTREMITY STRENGTH: Not tested patient is about 4 weeks postop  LYMPHEDEMA ASSESSMENTS:   LANDMARK RIGHT   01/21/24 R  02/08/26  10 cm proximal to olecranon process 28.2 28.2  Olecranon process 26.5  26.5  10 cm proximal to ulnar styloid process 22.1 at 15 cm    Just proximal to ulnar styloid process    Across hand at thumb web space    At base of 2nd digit    (Blank rows = not tested)  LANDMARK LEFT   01/21/24 L 02/09/24  10 cm proximal to olecranon process 28.5 28.7.  Olecranon process 26.5 26.6  10 cm proximal to ulnar styloid process 22.4 at 15 cm    Just proximal to ulnar styloid process    Across hand at thumb web space    At base of 2nd digit    (Blank rows = not tested)  L-DEX LYMPHEDEMA SCREENING:  The patient was assessed using the L-Dex machine today to produce a lymphedema index baseline score. The patient will be reassessed on a regular basis (typically every 3 months) to obtain new L-Dex scores. If  the score is > 6.5 points away from his/her baseline score indicating onset of subclinical lymphedema, it will be recommended to wear a compression garment for 4 weeks, 12 hours per day and then be reassessed. If the  score continues to be > 6.5 points from baseline at reassessment, we will initiate lymphedema treatment. Assessing in this manner has a 95% rate of preventing clinically significant lymphedema.  5.6 score - L UE at risk  R UE dominant - no lymph nodes remove L-Dex score at baseline NOTE DONE - Pt ended up had lymph node removed on both sides.   PATIENT EDUCATION:  Education details: Lymphedema risk reduction and post op shoulder/posture HEP Person educated: Patient and daughter Education method: Explanation, Demonstration, Handout Education comprehension: Patient verbalized understanding and returned demonstration Session 02/09/24:  Patient was not seen since 02/24/2024.  Patient's dad passed away. Patient arrive after having another expander filling last week. Patient's active range of motion for bilateral shoulder about the same than 2 weeks ago. Patient report abduction external rotation worse than flexion. Was not as compliant with home program because of dad passing away. Reviewed with patient pulleys for shoulder flexion and abduction active assisted range of motion 20 reps each followed by active assisted range of motion for shoulder flexion on the wall using a soccer ball 15 reps.  Patient doing really well with increased motion Also reviewed with patient active assisted range of motion on table for shoulder flexion and abduction incorporating LAT stretches 15 reps Reinforced with patient to keep pain at a slight pull less than a 12-12-08. Patient to continue with scapular squeezes several times during the day. Patient encouraged to continue with shoulder range of motion at least 3 times a day. Did provide her with red Thera-Band for scapular retraction 2 sets of 12  pain-free As well as lats pull from top of door 2 sets of 12 pain-free Red Thera-Band  Patient report meeting with radiation MD next Tuesday and this for scheduled for removal of expanders and implants switch next Wednesday, 12 March.    ASSESSMENT:  CLINICAL IMPRESSION: Patient presented at follow-up for OT postop  6 -7 weeks bilateral mastectomy with immediate reconstruction and expanders placement.  Patient had 3 lymph nodes removed from the right and 6 on the left.  Surgery was done 12/22/2023.  Patient had expander filling again last week.  Patient was not seen for about 2 weeks-her dad passed away.  Patient is scheduled to meet with radiation Tuesday.  And is scheduled for switching expanders for silicone implants surgery Wednesday.  Patient continues to be limited in shoulder abduction more than flexion.  Made great progress in session.  Reinforce and reviewed with patient again doing her passive range of motion active assisted range of motion pain-free with a slight pull 3 times a day patient tolerated range of motion very well in session.  Keeping pain under 2/10.  Patient to follow-up with me after surgery again.  Remind patient when having more surgery next week to follow surgeon instructions.  As well as precautions.  Patient can benefit from skilled OT services to increase motion and strength in bilateral upper extremity and monitor her for lymphedema symptoms and signs.  To return to prior level of function.    Pt will benefit from skilled therapeutic intervention to improve on the following deficits: Decreased knowledge of precautions and lymphedema education, impaired UE functional use, pain, decreased ROM, postural dysfunction.   OT treatment/interventions: ADL/self-care home management, pt/family education, therapeutic exercise,manual therapy  REHAB POTENTIAL: Good  CLINICAL DECISION MAKING: Stable/uncomplicated  EVALUATION COMPLEXITY: Low   GOALS: Goals reviewed with  patient? YES  LONG TERM GOALS: (STG=LTG)    Name Target Date Goal status  1 Pt will be able to verbalize understanding of pertinent lymphedema risk reduction practices relevant to her dx specifically related to skin care.  Baseline:  No knowledge 6 wks ONGOING  2 Pt will be able to return demo and/or verbalize understanding of the post op HEP related to regaining shoulder ROM. Baseline:  No knowledge MET Achieved at eval       4 Pt will demo she has regained full shoulder ROM and function post operatively compared to baselines.  Baseline: See objective measurements taken today. 12 wks  ONGOING     PLAN:  OT FREQUENCY/DURATION: EVAL and  1-2 x wk for 6 wks .      Patient will follow up at outpatient cancer rehab 3-4 weeks following surgery.  If the patient requires occupational therapy at that time, a specific plan will be dictated and sent to the referring physician for approval.  Occupational Therapy Information for After Breast Cancer Surgery/Treatment:  Lymphedema is a swelling condition that you may be at risk for in your arm if you have lymph nodes removed from the armpit area.  After a sentinel node biopsy, the risk is approximately 5-9% and is higher after an axillary node dissection.  There is treatment available for this condition and it is not life-threatening.  Contact your physician or occupational therapist with concerns. You may begin the 4 shoulder/posture exercises (see additional sheet) when permitted by your physician (typically a week after surgery).  If you have drains, you may need to wait until those are removed before beginning range of motion exercises.  A general recommendation is to not lift your arms above shoulder height until drains are removed.  These exercises should be done to your tolerance and gently.  This is not a "no pain/no gain" type of recovery so listen to your body and stretch into the range of motion that you can tolerate, stopping if you have pain.   If you are having immediate reconstruction, ask your plastic surgeon about doing exercises as he or she may want you to wait. We encourage you to watch the ABC (After Breast Cancer)  video. You will learn information related to lymphedema risk, prevention and treatment and additional exercises to regain mobility following surgery.  While undergoing any medical procedure or treatment, try to avoid blood pressure being taken or needle sticks from occurring on the arm on the side of cancer.   This recommendation begins after surgery and continues for the rest of your life.  This may help reduce your risk of getting lymphedema (swelling in your arm). An excellent resource for those seeking information on lymphedema is the National Lymp[employer] Network's web site. It can be accessed at www.lymphnet.org If you notice swelling in your hand, arm or breast at any time following surgery (even if it is many years from now), please contact your doctor or occupational therapist to discuss this.  Lymphedema can be treated at any time but it is easier for you if it is treated early on.  If you feel like your shoulder motion is not returning to normal in a reasonable amount of time, please contact your surgeon or occupational therapist.  [facility] [phone]. [address], [place], [place] [address]      Oletta Cohn, OTR/L,CLT 02/09/2024, 12:39 PM

## 2024-02-12 ENCOUNTER — Encounter: Payer: Self-pay | Admitting: Surgical

## 2024-02-12 ENCOUNTER — Encounter (HOSPITAL_BASED_OUTPATIENT_CLINIC_OR_DEPARTMENT_OTHER): Payer: Self-pay | Admitting: Plastic Surgery

## 2024-02-12 ENCOUNTER — Other Ambulatory Visit: Payer: Self-pay

## 2024-02-12 ENCOUNTER — Ambulatory Visit: Payer: BC Managed Care – PPO | Admitting: Surgical

## 2024-02-12 VITALS — BP 161/80 | HR 74 | Ht 73.0 in | Wt 186.6 lb

## 2024-02-12 DIAGNOSIS — Z9013 Acquired absence of bilateral breasts and nipples: Secondary | ICD-10-CM

## 2024-02-12 DIAGNOSIS — D0511 Intraductal carcinoma in situ of right breast: Secondary | ICD-10-CM

## 2024-02-12 DIAGNOSIS — C50911 Malignant neoplasm of unspecified site of right female breast: Secondary | ICD-10-CM

## 2024-02-12 DIAGNOSIS — Z170 Estrogen receptor positive status [ER+]: Secondary | ICD-10-CM

## 2024-02-12 MED ORDER — CEPHALEXIN 500 MG PO CAPS: 500.0000 mg | ORAL_CAPSULE | Freq: Four times a day (QID) | ORAL | 0 refills | Status: AC

## 2024-02-12 MED ORDER — ONDANSETRON HCL 4 MG PO TABS: 4.0000 mg | ORAL_TABLET | Freq: Three times a day (TID) | ORAL | 0 refills | Status: DC | PRN

## 2024-02-12 MED ORDER — TRAMADOL HCL 50 MG PO TABS: 50.0000 mg | ORAL_TABLET | Freq: Three times a day (TID) | ORAL | 0 refills | Status: AC | PRN

## 2024-02-12 NOTE — H&P (View-Only) (Signed)
 Patient ID: Angelica Dougherty, female    DOB: Apr 19, 1971, 53 y.o.   MRN: [id number]  Chief Complaint  Patient presents with   Pre-op Exam      ICD-10-CM   1. Ductal carcinoma in situ (DCIS) of right breast  D05.11     2. S/P bilateral mastectomy  Z90.13     3. Malignant neoplasm of right breast in female, estrogen receptor positive, unspecified site of breast (HCC)  C50.911    Z17.0       History of Present Illness: Angelica Dougherty is a 53 y.o.  female  with a history of breast reconstruction with expanders in place.  She presents for preoperative evaluation for upcoming procedure, removal of bilateral breast tissue expanders and placement of silicone breast implants, scheduled for 02/18/2024 with Dr. Ulice Bold.  The patient has not had problems with anesthesia. No history of DVT/PE.  No family history of DVT/PE.  No family or personal history of bleeding or clotting disorders.  Patient is not currently taking any blood thinners.  No history of CVA/MI.   Patient did not have any cardiac or pulmonary disease.  Her blood pressure was elevated today during today's appointment, but she reports that she does have elevation in her blood pressure when visiting doctors appointments.  She does not have any symptomatic changes, denies any headaches or vision changes or any other symptoms.  Job: Geologist, engineering, is required to lift heavy objects.  We will have her remain out of work for about 6 weeks after surgery from 02/18/2024 until 04/05/2024.  PMH Significant for: DCIS of right breast, left breast invasive mammary carcinoma PONV, hyperlipidemia  Patient is feeling well, no recent changes to her health.  She reports she is happy with the current size of her tissue expanders.  She currently has 400/535 cc in each expander.  Past Medical History: Allergies: Allergies  Allergen Reactions   Codeine Other (See Comments)    Swelling    Current  Medications:  Current Outpatient Medications:    acetaminophen (TYLENOL) 500 MG tablet, Take 500 mg by mouth every 6 (six) hours as needed., Disp: , Rfl:    cephALEXin (KEFLEX) 500 MG capsule, Take 1 capsule (500 mg total) by mouth 4 (four) times daily for 5 days., Disp: 20 capsule, Rfl: 0   diazepam (VALIUM) 2 MG tablet, Take 1 tablet (2 mg total) by mouth every 12 (twelve) hours as needed for muscle spasms., Disp: 20 tablet, Rfl: 0   diphenhydrAMINE (BENADRYL) 25 MG tablet, Take 25 mg by mouth daily as needed for allergies., Disp: , Rfl:    ondansetron (ZOFRAN) 4 MG tablet, Take 1 tablet (4 mg total) by mouth every 8 (eight) hours as needed for nausea or vomiting., Disp: 20 tablet, Rfl: 0   ondansetron (ZOFRAN) 4 MG tablet, Take 1 tablet (4 mg total) by mouth every 8 (eight) hours as needed for nausea or vomiting., Disp: 20 tablet, Rfl: 0   traMADol (ULTRAM) 50 MG tablet, Take 1 tablet (50 mg total) by mouth every 8 (eight) hours as needed for up to 5 days., Disp: 15 tablet, Rfl: 0  Past Medical Problems: Past Medical History:  Diagnosis Date   Allergy    Ductal carcinoma in situ (DCIS) of right breast 09/2023   Fibroid uterus    Hyperlipidemia    Malignant neoplasm of left breast in female, estrogen receptor positive (HCC) 09/2023   Malignant neoplasm of right breast in female, estrogen receptor positive (  HCC) 09/2023   PONV (postoperative nausea and vomiting)     Past Surgical History: Past Surgical History:  Procedure Laterality Date   ABDOMINAL HYSTERECTOMY  06/2006   Total hysterctomy excessive bleeding and fibroids per patient report. [facility]   BREAST BIOPSY Right 09/25/2023   stereo bx, calcs, RIBBON clip-path pending   BREAST BIOPSY Left 09/25/2023   [place] bx, savi tag as marker, path pending   BREAST BIOPSY Right 09/25/2023   MM RT BREAST BX W LOC DEV 1ST LESION IMAGE BX SPEC STEREO GUIDE 09/25/2023 [facility]   BREAST BIOPSY Left 09/25/2023   [place] LT BREAST BX W  LOC DEV 1ST LESION IMG BX SPEC US GUIDE 09/25/2023 [facility]   BREAST EXCISIONAL BIOPSY Right 1993   benign   BREAST LUMPECTOMY Right 1991   benign   BREAST RECONSTRUCTION WITH PLACEMENT OF TISSUE EXPANDER AND FLEX HD (ACELLULAR HYDRATED DERMIS) Bilateral 12/22/2023   Procedure: BREAST RECONSTRUCTION WITH PLACEMENT OF TISSUE EXPANDER AND FLEX HD (ACELLULAR HYDRATED DERMIS);  Surgeon: Peggye Form, DO;  Location: [facility];  Service: Plastics;  Laterality: Bilateral;   CESAREAN SECTION  12/16/2005   MASTECTOMY W/ SENTINEL NODE BIOPSY Bilateral 12/22/2023   Procedure: MASTECTOMY WITH SENTINEL LYMPH NODE BIOPSY, simple mastectomy;  Surgeon: Kandis Cocking, MD;  Location: [facility];  Service: General;  Laterality: Bilateral;    Social History: Social History   Socioeconomic History   Marital status: Single    Spouse name: Not on file   Number of children: 1   Years of education: Not on file   Highest education level: Some college, no degree  Occupational History   Not on file  Tobacco Use   Smoking status: Never    Passive exposure: Never   Smokeless tobacco: Never  Vaping Use   Vaping status: Never Used  Substance and Sexual Activity   Alcohol use: No   Drug use: No   Sexual activity: Not Currently  Other Topics Concern   Not on file  Social History Narrative   Not on file   Social Drivers of Health   Financial Resource Strain: Low Risk  (08/15/2023)   Overall Financial Resource Strain (CARDIA)    Difficulty of Paying Living Expenses: Not very hard  Food Insecurity: No Food Insecurity (12/22/2023)   Hunger Vital Sign    Worried About Running Out of Food in the Last Year: Never true    Ran Out of Food in the Last Year: Never true  Transportation Needs: No Transportation Needs (12/22/2023)   PRAPARE - Administrator, Civil Service (Medical): No    Lack of Transportation (Non-Medical): No  Physical Activity: Insufficiently Active (08/15/2023)   Exercise  Vital Sign    Days of Exercise per Week: 2 days    Minutes of Exercise per Session: 20 min  Stress: No Stress Concern Present (08/15/2023)   [employer] of Occupational Health - Occupational Stress Questionnaire    Feeling of Stress : Not at all  Social Connections: Moderately Integrated (08/15/2023)   Social Connection and Isolation Panel [NHANES]    Frequency of Communication with Friends and Family: More than three times a week    Frequency of Social Gatherings with Friends and Family: Once a week    Attends Religious Services: More than 4 times per year    Active Member of [employer] or Organizations: Yes    Attends Engineer, structural: More than 4 times per year    Marital Status: Never married  Intimate Partner Violence: Not At Risk (12/22/2023)   Humiliation, Afraid, Rape, and Kick questionnaire    Fear of Current or Ex-Partner: No    Emotionally Abused: No    Physically Abused: No    Sexually Abused: No    Family History: Family History  Problem Relation Age of Onset   Asthma Mother    Cancer Maternal Uncle        back cancer at 43   Breast cancer Paternal Aunt        dx 30s, bilateral   Breast cancer Paternal Aunt        dx 72s   Breast cancer Cousin        dx 52s    Review of Systems: Review of Systems  Constitutional: Negative.   Respiratory: Negative.    Cardiovascular: Negative.   Neurological: Negative.     Physical Exam: Vital Signs BP (!) 161/80 (BP Location: Left Arm, Patient Position: Sitting, Cuff Size: Normal)   Pulse 74   Ht 6\' 1"  (1.854 m)   Wt 186 lb 9.6 oz (84.6 kg)   LMP 06/08/2006 (Approximate) Comment: Hysterectomy  SpO2 98%   BMI 24.62 kg/m   Physical Exam Constitutional:      General: Not in acute distress.    Appearance: Normal appearance. Not ill-appearing.  HENT:     Head: Normocephalic and atraumatic.  Eyes:     Pupils: Pupils are equal, round Neck:     Musculoskeletal: Normal range of motion.  Cardiovascular:      Rate and Rhythm: Normal rate    Pulses: Normal pulses.  Pulmonary:     Effort: Pulmonary effort is normal. No respiratory distress.  Abdominal:     General: Abdomen is flat. There is no distension.  Musculoskeletal: Normal range of motion.  Skin:    General: Skin is warm and dry.     Findings: No erythema or rash.  Neurological:     General: No focal deficit present.     Mental Status: Alert and oriented to person, place, and time. Mental status is at baseline.     Motor: No weakness.  Psychiatric:        Mood and Affect: Mood normal.        Behavior: Behavior normal.    Assessment/Plan: The patient is scheduled for exchange of bilateral breast tissue expanders for bilateral breast implants with Dr. Ulice Bold.  Risks, benefits, and alternatives of procedure discussed, questions answered and consent obtained.    Smoking Status: Non-smoker; Counseling Given?  N/A  Caprini Score: 6; Risk Factors include: Age, history of breast cancer, BMI > 25, and length of planned surgery. Recommendation for mechanical prophylaxis. Encourage early ambulation.   Pictures obtained: 02/05/24  Post-op Rx sent to pharmacy: Tramadol, Zofran, Keflex  Patient was provided with the General Surgical Risk consent document and Pain Medication Agreement prior to their appointment.  They had adequate time to read through the risk consent documents and Pain Medication Agreement. We also discussed them in person together during this preop appointment. All of their questions were answered to their satisfaction.  Recommended calling if they have any further questions.  Risk consent form and Pain Medication Agreement to be scanned into patient's chart.  Patient was provided with the Mentor implant patient decision checklist and this was completed during today's preoperative evaluation. Patient had time to read through the information and any questions were answered to their content. Form will be scanned into patient's  chart.  The risks that  can be encountered with and after placement of a breast implant were discussed and include the following but not limited to these: bleeding, infection, delayed healing, anesthesia risks, skin sensation changes, injury to structures including nerves, blood vessels, and muscles which may be temporary or permanent, allergies to tape, suture materials and glues, blood products, topical preparations or injected agents, skin contour irregularities, skin discoloration and swelling, deep vein thrombosis, cardiac and pulmonary complications, pain, which may persist, fluid accumulation, wrinkling of the skin over the implanmt, changes in nipple or breast sensation, implant leakage or rupture, faulty position of the implant, persistent pain, formation of tight scar tissue around the implant (capsular contracture).  She is likely going to need radiation of her left axilla and superior breast, we discussed that this could change the shape and size of her left breast reconstruction.  We discussed that radiation could also result in skin tightening and firmness.  This could also result in increased capsular contracture.  Electronically signed by: Kermit Balo Nylah Butkus, PA-C 02/12/2024 3:27 PM

## 2024-02-12 NOTE — Progress Notes (Signed)
 Patient ID: Angelica Dougherty, female    DOB: Apr 19, 1971, 53 y.o.   MRN: [id number]  Chief Complaint  Patient presents with   Pre-op Exam      ICD-10-CM   1. Ductal carcinoma in situ (DCIS) of right breast  D05.11     2. S/P bilateral mastectomy  Z90.13     3. Malignant neoplasm of right breast in female, estrogen receptor positive, unspecified site of breast (HCC)  C50.911    Z17.0       History of Present Illness: Angelica Dougherty is a 53 y.o.  female  with a history of breast reconstruction with expanders in place.  She presents for preoperative evaluation for upcoming procedure, removal of bilateral breast tissue expanders and placement of silicone breast implants, scheduled for 02/18/2024 with Dr. Ulice Bold.  The patient has not had problems with anesthesia. No history of DVT/PE.  No family history of DVT/PE.  No family or personal history of bleeding or clotting disorders.  Patient is not currently taking any blood thinners.  No history of CVA/MI.   Patient did not have any cardiac or pulmonary disease.  Her blood pressure was elevated today during today's appointment, but she reports that she does have elevation in her blood pressure when visiting doctors appointments.  She does not have any symptomatic changes, denies any headaches or vision changes or any other symptoms.  Job: Geologist, engineering, is required to lift heavy objects.  We will have her remain out of work for about 6 weeks after surgery from 02/18/2024 until 04/05/2024.  PMH Significant for: DCIS of right breast, left breast invasive mammary carcinoma PONV, hyperlipidemia  Patient is feeling well, no recent changes to her health.  She reports she is happy with the current size of her tissue expanders.  She currently has 400/535 cc in each expander.  Past Medical History: Allergies: Allergies  Allergen Reactions   Codeine Other (See Comments)    Swelling    Current  Medications:  Current Outpatient Medications:    acetaminophen (TYLENOL) 500 MG tablet, Take 500 mg by mouth every 6 (six) hours as needed., Disp: , Rfl:    cephALEXin (KEFLEX) 500 MG capsule, Take 1 capsule (500 mg total) by mouth 4 (four) times daily for 5 days., Disp: 20 capsule, Rfl: 0   diazepam (VALIUM) 2 MG tablet, Take 1 tablet (2 mg total) by mouth every 12 (twelve) hours as needed for muscle spasms., Disp: 20 tablet, Rfl: 0   diphenhydrAMINE (BENADRYL) 25 MG tablet, Take 25 mg by mouth daily as needed for allergies., Disp: , Rfl:    ondansetron (ZOFRAN) 4 MG tablet, Take 1 tablet (4 mg total) by mouth every 8 (eight) hours as needed for nausea or vomiting., Disp: 20 tablet, Rfl: 0   ondansetron (ZOFRAN) 4 MG tablet, Take 1 tablet (4 mg total) by mouth every 8 (eight) hours as needed for nausea or vomiting., Disp: 20 tablet, Rfl: 0   traMADol (ULTRAM) 50 MG tablet, Take 1 tablet (50 mg total) by mouth every 8 (eight) hours as needed for up to 5 days., Disp: 15 tablet, Rfl: 0  Past Medical Problems: Past Medical History:  Diagnosis Date   Allergy    Ductal carcinoma in situ (DCIS) of right breast 09/2023   Fibroid uterus    Hyperlipidemia    Malignant neoplasm of left breast in female, estrogen receptor positive (HCC) 09/2023   Malignant neoplasm of right breast in female, estrogen receptor positive (  HCC) 09/2023   PONV (postoperative nausea and vomiting)     Past Surgical History: Past Surgical History:  Procedure Laterality Date   ABDOMINAL HYSTERECTOMY  06/2006   Total hysterctomy excessive bleeding and fibroids per patient report. [facility]   BREAST BIOPSY Right 09/25/2023   stereo bx, calcs, RIBBON clip-path pending   BREAST BIOPSY Left 09/25/2023   [place] bx, savi tag as marker, path pending   BREAST BIOPSY Right 09/25/2023   MM RT BREAST BX W LOC DEV 1ST LESION IMAGE BX SPEC STEREO GUIDE 09/25/2023 [facility]   BREAST BIOPSY Left 09/25/2023   [place] LT BREAST BX W  LOC DEV 1ST LESION IMG BX SPEC US GUIDE 09/25/2023 [facility]   BREAST EXCISIONAL BIOPSY Right 1993   benign   BREAST LUMPECTOMY Right 1991   benign   BREAST RECONSTRUCTION WITH PLACEMENT OF TISSUE EXPANDER AND FLEX HD (ACELLULAR HYDRATED DERMIS) Bilateral 12/22/2023   Procedure: BREAST RECONSTRUCTION WITH PLACEMENT OF TISSUE EXPANDER AND FLEX HD (ACELLULAR HYDRATED DERMIS);  Surgeon: Peggye Form, DO;  Location: [facility];  Service: Plastics;  Laterality: Bilateral;   CESAREAN SECTION  12/16/2005   MASTECTOMY W/ SENTINEL NODE BIOPSY Bilateral 12/22/2023   Procedure: MASTECTOMY WITH SENTINEL LYMPH NODE BIOPSY, simple mastectomy;  Surgeon: Kandis Cocking, MD;  Location: [facility];  Service: General;  Laterality: Bilateral;    Social History: Social History   Socioeconomic History   Marital status: Single    Spouse name: Not on file   Number of children: 1   Years of education: Not on file   Highest education level: Some college, no degree  Occupational History   Not on file  Tobacco Use   Smoking status: Never    Passive exposure: Never   Smokeless tobacco: Never  Vaping Use   Vaping status: Never Used  Substance and Sexual Activity   Alcohol use: No   Drug use: No   Sexual activity: Not Currently  Other Topics Concern   Not on file  Social History Narrative   Not on file   Social Drivers of Health   Financial Resource Strain: Low Risk  (08/15/2023)   Overall Financial Resource Strain (CARDIA)    Difficulty of Paying Living Expenses: Not very hard  Food Insecurity: No Food Insecurity (12/22/2023)   Hunger Vital Sign    Worried About Running Out of Food in the Last Year: Never true    Ran Out of Food in the Last Year: Never true  Transportation Needs: No Transportation Needs (12/22/2023)   PRAPARE - Administrator, Civil Service (Medical): No    Lack of Transportation (Non-Medical): No  Physical Activity: Insufficiently Active (08/15/2023)   Exercise  Vital Sign    Days of Exercise per Week: 2 days    Minutes of Exercise per Session: 20 min  Stress: No Stress Concern Present (08/15/2023)   [employer] of Occupational Health - Occupational Stress Questionnaire    Feeling of Stress : Not at all  Social Connections: Moderately Integrated (08/15/2023)   Social Connection and Isolation Panel [NHANES]    Frequency of Communication with Friends and Family: More than three times a week    Frequency of Social Gatherings with Friends and Family: Once a week    Attends Religious Services: More than 4 times per year    Active Member of [employer] or Organizations: Yes    Attends Engineer, structural: More than 4 times per year    Marital Status: Never married  Intimate Partner Violence: Not At Risk (12/22/2023)   Humiliation, Afraid, Rape, and Kick questionnaire    Fear of Current or Ex-Partner: No    Emotionally Abused: No    Physically Abused: No    Sexually Abused: No    Family History: Family History  Problem Relation Age of Onset   Asthma Mother    Cancer Maternal Uncle        back cancer at 43   Breast cancer Paternal Aunt        dx 30s, bilateral   Breast cancer Paternal Aunt        dx 72s   Breast cancer Cousin        dx 52s    Review of Systems: Review of Systems  Constitutional: Negative.   Respiratory: Negative.    Cardiovascular: Negative.   Neurological: Negative.     Physical Exam: Vital Signs BP (!) 161/80 (BP Location: Left Arm, Patient Position: Sitting, Cuff Size: Normal)   Pulse 74   Ht 6\' 1"  (1.854 m)   Wt 186 lb 9.6 oz (84.6 kg)   LMP 06/08/2006 (Approximate) Comment: Hysterectomy  SpO2 98%   BMI 24.62 kg/m   Physical Exam Constitutional:      General: Not in acute distress.    Appearance: Normal appearance. Not ill-appearing.  HENT:     Head: Normocephalic and atraumatic.  Eyes:     Pupils: Pupils are equal, round Neck:     Musculoskeletal: Normal range of motion.  Cardiovascular:      Rate and Rhythm: Normal rate    Pulses: Normal pulses.  Pulmonary:     Effort: Pulmonary effort is normal. No respiratory distress.  Abdominal:     General: Abdomen is flat. There is no distension.  Musculoskeletal: Normal range of motion.  Skin:    General: Skin is warm and dry.     Findings: No erythema or rash.  Neurological:     General: No focal deficit present.     Mental Status: Alert and oriented to person, place, and time. Mental status is at baseline.     Motor: No weakness.  Psychiatric:        Mood and Affect: Mood normal.        Behavior: Behavior normal.    Assessment/Plan: The patient is scheduled for exchange of bilateral breast tissue expanders for bilateral breast implants with Dr. Ulice Bold.  Risks, benefits, and alternatives of procedure discussed, questions answered and consent obtained.    Smoking Status: Non-smoker; Counseling Given?  N/A  Caprini Score: 6; Risk Factors include: Age, history of breast cancer, BMI > 25, and length of planned surgery. Recommendation for mechanical prophylaxis. Encourage early ambulation.   Pictures obtained: 02/05/24  Post-op Rx sent to pharmacy: Tramadol, Zofran, Keflex  Patient was provided with the General Surgical Risk consent document and Pain Medication Agreement prior to their appointment.  They had adequate time to read through the risk consent documents and Pain Medication Agreement. We also discussed them in person together during this preop appointment. All of their questions were answered to their satisfaction.  Recommended calling if they have any further questions.  Risk consent form and Pain Medication Agreement to be scanned into patient's chart.  Patient was provided with the Mentor implant patient decision checklist and this was completed during today's preoperative evaluation. Patient had time to read through the information and any questions were answered to their content. Form will be scanned into patient's  chart.  The risks that  can be encountered with and after placement of a breast implant were discussed and include the following but not limited to these: bleeding, infection, delayed healing, anesthesia risks, skin sensation changes, injury to structures including nerves, blood vessels, and muscles which may be temporary or permanent, allergies to tape, suture materials and glues, blood products, topical preparations or injected agents, skin contour irregularities, skin discoloration and swelling, deep vein thrombosis, cardiac and pulmonary complications, pain, which may persist, fluid accumulation, wrinkling of the skin over the implanmt, changes in nipple or breast sensation, implant leakage or rupture, faulty position of the implant, persistent pain, formation of tight scar tissue around the implant (capsular contracture).  She is likely going to need radiation of her left axilla and superior breast, we discussed that this could change the shape and size of her left breast reconstruction.  We discussed that radiation could also result in skin tightening and firmness.  This could also result in increased capsular contracture.  Electronically signed by: Kermit Balo Nylah Butkus, PA-C 02/12/2024 3:27 PM

## 2024-02-17 ENCOUNTER — Ambulatory Visit: Payer: BC Managed Care – PPO | Admitting: Radiation Oncology

## 2024-02-18 ENCOUNTER — Ambulatory Visit (HOSPITAL_BASED_OUTPATIENT_CLINIC_OR_DEPARTMENT_OTHER): Admitting: Anesthesiology

## 2024-02-18 ENCOUNTER — Other Ambulatory Visit: Payer: Self-pay

## 2024-02-18 ENCOUNTER — Encounter (HOSPITAL_BASED_OUTPATIENT_CLINIC_OR_DEPARTMENT_OTHER)
Admission: RE | Disposition: A | Discharge status: Home / Self Care | Payer: Self-pay | Source: Home / Self Care | Attending: Plastic Surgery

## 2024-02-18 ENCOUNTER — Ambulatory Visit (HOSPITAL_BASED_OUTPATIENT_CLINIC_OR_DEPARTMENT_OTHER)
Admission: RE | Admit: 2024-02-18 | Discharge: 2024-02-18 | Disposition: A | Discharge status: Home / Self Care | Payer: BC Managed Care – PPO | Attending: Plastic Surgery | Admitting: Plastic Surgery

## 2024-02-18 ENCOUNTER — Encounter (HOSPITAL_BASED_OUTPATIENT_CLINIC_OR_DEPARTMENT_OTHER): Payer: Self-pay | Admitting: Plastic Surgery

## 2024-02-18 DIAGNOSIS — C50911 Malignant neoplasm of unspecified site of right female breast: Secondary | ICD-10-CM | POA: Diagnosis present

## 2024-02-18 DIAGNOSIS — Z170 Estrogen receptor positive status [ER+]: Secondary | ICD-10-CM | POA: Diagnosis not present

## 2024-02-18 DIAGNOSIS — Z9013 Acquired absence of bilateral breasts and nipples: Secondary | ICD-10-CM | POA: Insufficient documentation

## 2024-02-18 DIAGNOSIS — Z853 Personal history of malignant neoplasm of breast: Secondary | ICD-10-CM

## 2024-02-18 DIAGNOSIS — Z421 Encounter for breast reconstruction following mastectomy: Secondary | ICD-10-CM | POA: Diagnosis not present

## 2024-02-18 DIAGNOSIS — C50912 Malignant neoplasm of unspecified site of left female breast: Secondary | ICD-10-CM | POA: Diagnosis not present

## 2024-02-18 HISTORY — DX: Nausea with vomiting, unspecified: R11.2

## 2024-02-18 HISTORY — DX: Other specified postprocedural states: Z98.890

## 2024-02-18 HISTORY — PX: REMOVAL OF TISSUE EXPANDER AND PLACEMENT OF IMPLANT: SHX6457

## 2024-02-18 SURGERY — REMOVAL, TISSUE EXPANDER, BREAST, WITH IMPLANT INSERTION
Anesthesia: General | Site: Breast | Laterality: Bilateral

## 2024-02-18 MED ORDER — LIDOCAINE-EPINEPHRINE 1 %-1:100000 IJ SOLN
INTRAMUSCULAR | Status: DC | PRN
  Administered 2024-02-18: 20 mL

## 2024-02-18 MED ORDER — LIDOCAINE 2% (20 MG/ML) 5 ML SYRINGE
INTRAMUSCULAR | Status: DC | PRN
Start: 2024-02-18 — End: 2024-02-18
  Administered 2024-02-18: 60 mg via INTRAVENOUS

## 2024-02-18 MED ORDER — CHLORHEXIDINE GLUCONATE CLOTH 2 % EX PADS: 6.0000 | MEDICATED_PAD | Freq: Once | CUTANEOUS | Status: DC

## 2024-02-18 MED ORDER — PROPOFOL 500 MG/50ML IV EMUL
INTRAVENOUS | Status: AC
  Filled 2024-02-18: qty 50

## 2024-02-18 MED ORDER — FENTANYL CITRATE (PF) 100 MCG/2ML IJ SOLN
INTRAMUSCULAR | Status: DC | PRN
  Administered 2024-02-18 (×2): 25 ug via INTRAVENOUS
  Administered 2024-02-18: 50 ug via INTRAVENOUS

## 2024-02-18 MED ORDER — MIDAZOLAM HCL 2 MG/2ML IJ SOLN
INTRAMUSCULAR | Status: AC
  Filled 2024-02-18: qty 2

## 2024-02-18 MED ORDER — OXYCODONE HCL 5 MG PO TABS: 5.0000 mg | ORAL_TABLET | ORAL | Status: DC | PRN

## 2024-02-18 MED ORDER — GLYCOPYRROLATE PF 0.2 MG/ML IJ SOSY
PREFILLED_SYRINGE | INTRAMUSCULAR | Status: AC
  Filled 2024-02-18: qty 1

## 2024-02-18 MED ORDER — PROPOFOL 10 MG/ML IV BOLUS
INTRAVENOUS | Status: AC
  Filled 2024-02-18: qty 20

## 2024-02-18 MED ORDER — ONDANSETRON HCL 4 MG/2ML IJ SOLN
INTRAMUSCULAR | Status: DC | PRN
  Administered 2024-02-18: 4 mg via INTRAVENOUS

## 2024-02-18 MED ORDER — SODIUM CHLORIDE 0.9% FLUSH: 3.0000 mL | Freq: Two times a day (BID) | INTRAVENOUS | Status: DC

## 2024-02-18 MED ORDER — ACETAMINOPHEN 500 MG PO TABS
ORAL_TABLET | ORAL | Status: AC
  Filled 2024-02-18: qty 2

## 2024-02-18 MED ORDER — TRIAMCINOLONE ACETONIDE 40 MG/ML IJ SUSP
INTRAMUSCULAR | Status: DC | PRN
  Administered 2024-02-18: .1 mL

## 2024-02-18 MED ORDER — PHENYLEPHRINE 80 MCG/ML (10ML) SYRINGE FOR IV PUSH (FOR BLOOD PRESSURE SUPPORT)
PREFILLED_SYRINGE | INTRAVENOUS | Status: DC | PRN
  Administered 2024-02-18 (×2): 80 ug via INTRAVENOUS

## 2024-02-18 MED ORDER — PROPOFOL 10 MG/ML IV BOLUS
INTRAVENOUS | Status: DC | PRN
  Administered 2024-02-18: 150 ug/kg/min via INTRAVENOUS
  Administered 2024-02-18: 50 mg via INTRAVENOUS
  Administered 2024-02-18: 150 mg via INTRAVENOUS

## 2024-02-18 MED ORDER — SODIUM CHLORIDE 0.9% FLUSH: 3.0000 mL | INTRAVENOUS | Status: DC | PRN

## 2024-02-18 MED ORDER — ONDANSETRON HCL 4 MG/2ML IJ SOLN
INTRAMUSCULAR | Status: AC
  Filled 2024-02-18: qty 2

## 2024-02-18 MED ORDER — DEXAMETHASONE SODIUM PHOSPHATE 10 MG/ML IJ SOLN
INTRAMUSCULAR | Status: DC | PRN
  Administered 2024-02-18: 5 mg via INTRAVENOUS

## 2024-02-18 MED ORDER — ACETAMINOPHEN 325 MG RE SUPP: 650.0000 mg | RECTAL | Status: DC | PRN

## 2024-02-18 MED ORDER — VASHE WOUND IRRIGATION OPTIME
TOPICAL | Status: DC | PRN
  Administered 2024-02-18: 34 [oz_av]

## 2024-02-18 MED ORDER — ACETAMINOPHEN 325 MG PO TABS: 650.0000 mg | ORAL_TABLET | ORAL | Status: DC | PRN

## 2024-02-18 MED ORDER — TRIAMCINOLONE ACETONIDE 40 MG/ML IJ SUSP
INTRAMUSCULAR | Status: AC
  Filled 2024-02-18: qty 5

## 2024-02-18 MED ORDER — CEFAZOLIN SODIUM-DEXTROSE 2-4 GM/100ML-% IV SOLN
2.0000 g | INTRAVENOUS | Status: AC
  Administered 2024-02-18: 2 g via INTRAVENOUS

## 2024-02-18 MED ORDER — FENTANYL CITRATE (PF) 100 MCG/2ML IJ SOLN: 25.0000 ug | INTRAMUSCULAR | Status: DC | PRN

## 2024-02-18 MED ORDER — SCOPOLAMINE 1 MG/3DAYS TD PT72
1.0000 | MEDICATED_PATCH | TRANSDERMAL | Status: DC
  Administered 2024-02-18: 1.5 mg via TRANSDERMAL

## 2024-02-18 MED ORDER — FENTANYL CITRATE (PF) 100 MCG/2ML IJ SOLN
INTRAMUSCULAR | Status: AC
  Filled 2024-02-18: qty 2

## 2024-02-18 MED ORDER — LACTATED RINGERS IV SOLN: INTRAVENOUS | Status: DC

## 2024-02-18 MED ORDER — EPHEDRINE 5 MG/ML INJ
INTRAVENOUS | Status: AC
  Filled 2024-02-18: qty 5

## 2024-02-18 MED ORDER — CEFAZOLIN SODIUM-DEXTROSE 2-4 GM/100ML-% IV SOLN
INTRAVENOUS | Status: AC
  Filled 2024-02-18: qty 100

## 2024-02-18 MED ORDER — DEXAMETHASONE SODIUM PHOSPHATE 10 MG/ML IJ SOLN
INTRAMUSCULAR | Status: AC
  Filled 2024-02-18: qty 1

## 2024-02-18 MED ORDER — EPHEDRINE SULFATE-NACL 50-0.9 MG/10ML-% IV SOSY
PREFILLED_SYRINGE | INTRAVENOUS | Status: DC | PRN
  Administered 2024-02-18: 10 mg via INTRAVENOUS

## 2024-02-18 MED ORDER — OXYCODONE HCL 5 MG PO TABS: 5.0000 mg | ORAL_TABLET | Freq: Once | ORAL | Status: DC | PRN

## 2024-02-18 MED ORDER — SCOPOLAMINE 1 MG/3DAYS TD PT72
MEDICATED_PATCH | TRANSDERMAL | Status: AC
Start: 2024-02-18 — End: ?
  Filled 2024-02-18: qty 1

## 2024-02-18 MED ORDER — PHENYLEPHRINE 80 MCG/ML (10ML) SYRINGE FOR IV PUSH (FOR BLOOD PRESSURE SUPPORT)
PREFILLED_SYRINGE | INTRAVENOUS | Status: AC
  Filled 2024-02-18: qty 10

## 2024-02-18 MED ORDER — SODIUM CHLORIDE 0.9 % IV SOLN: 250.0000 mL | INTRAVENOUS | Status: DC | PRN

## 2024-02-18 MED ORDER — AMISULPRIDE (ANTIEMETIC) 5 MG/2ML IV SOLN: 10.0000 mg | Freq: Once | INTRAVENOUS | Status: DC | PRN

## 2024-02-18 MED ORDER — LIDOCAINE 2% (20 MG/ML) 5 ML SYRINGE
INTRAMUSCULAR | Status: AC
  Filled 2024-02-18: qty 5

## 2024-02-18 MED ORDER — MIDAZOLAM HCL 2 MG/2ML IJ SOLN
INTRAMUSCULAR | Status: DC | PRN
  Administered 2024-02-18: 2 mg via INTRAVENOUS

## 2024-02-18 MED ORDER — ACETAMINOPHEN 500 MG PO TABS
1000.0000 mg | ORAL_TABLET | Freq: Once | ORAL | Status: AC
  Administered 2024-02-18: 1000 mg via ORAL

## 2024-02-18 MED ORDER — GLYCOPYRROLATE PF 0.2 MG/ML IJ SOSY
PREFILLED_SYRINGE | INTRAMUSCULAR | Status: DC | PRN
  Administered 2024-02-18: .2 mg via INTRAVENOUS

## 2024-02-18 MED ORDER — OXYCODONE HCL 5 MG/5ML PO SOLN: 5.0000 mg | Freq: Once | ORAL | Status: DC | PRN

## 2024-02-18 SURGICAL SUPPLY — 68 items
BAG DECANTER FOR FLEXI CONT (MISCELLANEOUS) IMPLANT
BINDER BREAST LRG (GAUZE/BANDAGES/DRESSINGS) IMPLANT
BINDER BREAST MEDIUM (GAUZE/BANDAGES/DRESSINGS) IMPLANT
BINDER BREAST XLRG (GAUZE/BANDAGES/DRESSINGS) IMPLANT
BINDER BREAST XXLRG (GAUZE/BANDAGES/DRESSINGS) IMPLANT
BIOPATCH RED 1 DISK 7.0 (GAUZE/BANDAGES/DRESSINGS) IMPLANT
BLADE SURG 10 STRL SS (BLADE) ×1 IMPLANT
BLADE SURG 15 STRL LF DISP TIS (BLADE) ×1 IMPLANT
CANISTER SUCT 1200ML W/VALVE (MISCELLANEOUS) ×1 IMPLANT
COVER BACK TABLE 60X90IN (DRAPES) ×1 IMPLANT
COVER MAYO STAND STRL (DRAPES) ×1 IMPLANT
DERMABOND ADVANCED .7 DNX12 (GAUZE/BANDAGES/DRESSINGS) ×2 IMPLANT
DRAIN CHANNEL 19F RND (DRAIN) IMPLANT
DRAPE LAPAROSCOPIC ABDOMINAL (DRAPES) ×1 IMPLANT
DRAPE UTILITY XL STRL (DRAPES) ×1 IMPLANT
DRESSING MEPILEX FLEX 4X4 (GAUZE/BANDAGES/DRESSINGS) IMPLANT
DRSG MEPILEX FLEX 4X4 (GAUZE/BANDAGES/DRESSINGS) IMPLANT
DRSG MEPILEX POST OP 4X8 (GAUZE/BANDAGES/DRESSINGS) IMPLANT
ELECT BLADE 4.0 EZ CLEAN MEGAD (MISCELLANEOUS) ×1 IMPLANT
ELECT COATED BLADE 2.86 ST (ELECTRODE) IMPLANT
ELECT REM PT RETURN 9FT ADLT (ELECTROSURGICAL) ×1 IMPLANT
ELECTRODE BLDE 4.0 EZ CLN MEGD (MISCELLANEOUS) ×1 IMPLANT
ELECTRODE REM PT RTRN 9FT ADLT (ELECTROSURGICAL) ×1 IMPLANT
EVACUATOR SILICONE 100CC (DRAIN) IMPLANT
FUNNEL KELLER 2 DISP (MISCELLANEOUS) IMPLANT
GAUZE PAD ABD 8X10 STRL (GAUZE/BANDAGES/DRESSINGS) ×2 IMPLANT
GLOVE BIO SURGEON STRL SZ 6.5 (GLOVE) ×2 IMPLANT
GLOVE BIO SURGEON STRL SZ7.5 (GLOVE) IMPLANT
GLOVE BIOGEL PI IND STRL 7.0 (GLOVE) IMPLANT
GOWN STRL REUS W/ TWL LRG LVL3 (GOWN DISPOSABLE) ×1 IMPLANT
GOWN STRL REUS W/TWL XL LVL3 (GOWN DISPOSABLE) IMPLANT
IMPL BREAST GEL HP 375CC (Breast) IMPLANT
IMPLANT BREAST GEL HP 375CC (Breast) ×2 IMPLANT
IV NS 1000ML BAXH (IV SOLUTION) IMPLANT
NDL HYPO 18GX1.5 BLUNT FILL (NEEDLE) IMPLANT
NDL HYPO 22X1.5 SAFETY MO (MISCELLANEOUS) ×1 IMPLANT
NDL SAFETY ECLIPSE 18X1.5 (NEEDLE) IMPLANT
NEEDLE HYPO 18GX1.5 BLUNT FILL (NEEDLE) ×1 IMPLANT
NEEDLE HYPO 22X1.5 SAFETY MO (MISCELLANEOUS) ×2 IMPLANT
PACK BASIN DAY SURGERY FS (CUSTOM PROCEDURE TRAY) ×1 IMPLANT
PENCIL SMOKE EVACUATOR (MISCELLANEOUS) ×1 IMPLANT
PIN SAFETY STERILE (MISCELLANEOUS) IMPLANT
SIZER BRST REUSE 400CC (SIZER) IMPLANT
SIZER BRST REUSE P5.3 375CC (SIZER) IMPLANT
SLEEVE SCD COMPRESS KNEE MED (STOCKING) ×1 IMPLANT
SPIKE FLUID TRANSFER (MISCELLANEOUS) IMPLANT
SPONGE T-LAP 18X18 ~~LOC~~+RFID (SPONGE) ×2 IMPLANT
STRIP SUTURE WOUND CLOSURE 1/2 (MISCELLANEOUS) IMPLANT
SUT ETHIBOND 0 MO6 C/R (SUTURE) IMPLANT
SUT MNCRL AB 3-0 PS2 18 (SUTURE) ×2 IMPLANT
SUT MNCRL AB 4-0 PS2 18 (SUTURE) IMPLANT
SUT MON AB 3-0 SH27 (SUTURE) ×2 IMPLANT
SUT MON AB 5-0 PS2 18 (SUTURE) IMPLANT
SUT PDS 3-0 CT2 (SUTURE) ×4 IMPLANT
SUT PDS AB 2-0 CT2 27 (SUTURE) IMPLANT
SUT PDS II 3-0 CT2 27 ABS (SUTURE) IMPLANT
SUT SILK 3 0 PS 1 (SUTURE) IMPLANT
SUT VIC AB 3-0 SH 27X BRD (SUTURE) IMPLANT
SUT VIC AB 4-0 PS2 18 (SUTURE) IMPLANT
SYR 10ML LL (SYRINGE) IMPLANT
SYR 3ML LL SCALE MARK (SYRINGE) IMPLANT
SYR BULB IRRIG 60ML STRL (SYRINGE) ×1 IMPLANT
SYR CONTROL 10ML LL (SYRINGE) ×1 IMPLANT
TOWEL GREEN STERILE FF (TOWEL DISPOSABLE) ×2 IMPLANT
TRAY DSU PREP LF (CUSTOM PROCEDURE TRAY) ×1 IMPLANT
TUBE CONNECTING 20X1/4 (TUBING) ×1 IMPLANT
UNDERPAD 30X36 HEAVY ABSORB (UNDERPADS AND DIAPERS) ×2 IMPLANT
YANKAUER SUCT BULB TIP NO VENT (SUCTIONS) ×1 IMPLANT

## 2024-02-18 NOTE — Op Note (Signed)
 Op report   DATE OF OPERATION: 02/18/2024  LOCATION: [facility]  SURGICAL DIVISION: Plastic Surgery  PREOPERATIVE DIAGNOSIS:  1.History of breast cancer.  2. Acquired absence of bilateral breast.   POSTOPERATIVE DIAGNOSIS:  1. History of breast cancer.  2. Acquired absence of bilateral breast.   PROCEDURE:  1. Bilateral exchange of tissue expanders for implants.  2. Bilateral capsulotomies for implant respositioning.  SURGEON: Foster Simpson, DO  ASSISTANT: Keenan Bachelor, PA  ANESTHESIA:  General.   COMPLICATIONS: None.   IMPLANTS: Left - Mentor Smooth Round Ultra High Profile Gel 375 cc.  Right - Mentor Smooth Round Ultra High Profile Gel 375 cc.  INDICATIONS FOR PROCEDURE:  The patient, Angelica Dougherty, is a 53 y.o. female born on 13-Jan-1971, is here for treatment after bilateral mastectomies.  She had tissue expanders placed at the time of mastectomies. She now presents for exchange of her expanders for implants.  She requires capsulotomies to better position the implants. MRN: [id number]  CONSENT:  Informed consent was obtained directly from the patient. Risks, benefits and alternatives were fully discussed. Specific risks including but not limited to bleeding, infection, hematoma, seroma, scarring, pain, implant infection, implant extrusion, capsular contracture, asymmetry, wound healing problems, and need for further surgery were all discussed. The patient did have an ample opportunity to have her questions answered to her satisfaction.   DESCRIPTION OF PROCEDURE:  The patient was taken to the operating room. SCDs were placed and IV antibiotics were given. The patient's chest was prepped and draped in a sterile fashion. A time out was performed and the implants to be used were identified.    Right breast: Local with epinephrine was used to infiltrate at the incision site. The old mastectomy scar was incised.  The mastectomy flaps from  the superior and inferior flaps were raised over the ADM for several centimeters to minimize tension for the closure. The ADM was split inferior to the skin incision to expose and remove the tissue expander.  Inspection of the pocket showed a normal healthy capsule and good integration of the biologic matrix.  The pocket was irrigated with antibiotic solution. Measurements were made and a sizer used to confirm adequate pocket size for the implant dimensions. The 400 cc sizer looked like the same size as the expanders.  The patient wanted to be slightly smaller.  The 375 cc sizer looked good.  Hemostasis was ensured with electrocautery. New gloves were placed. The implant was soaked in vashe solution and then placed in the pocket and oriented appropriately. The pectoralis major muscle and capsule on the anterior surface were re-closed with a 3-0 Monocryl suture. The remaining skin was closed with 4-0 Monocryl deep dermal and 5-0 Monocryl subcuticular stitches.   Left breast: The old mastectomy scar was incised.  The mastectomy flaps from the superior and inferior flaps were raised over the ADM for several centimeters to minimize tension for the closure. The pectoralis was split inferior to the skin incision to expose and remove the tissue expander.  Inspection of the pocket showed a normal healthy capsule and good integration of the biologic matrix.  Superior capsulotomies were performed to allow for breast pocket expansion.  Measurements were made and a sizer utilized to confirm adequate pocket size for the implant dimensions.  Hemostasis was ensured with the electrocautery.  New gloves were applied. The implant was soaked in vashe solution and placed in the pocket and oriented appropriately. The ADM and capsule on the anterior surface  were re-closed with a 3-0 Monocryl suture. The remaining skin was closed with 4-0 Monocryl deep dermal and 5-0 Monocryl subcuticular stitches.  Dermabond was applied to the incision  site. A breast binder and ABDs were placed.  The patient was allowed to wake from anesthesia and taken to the recovery room in satisfactory condition.   The advanced practice practitioner (APP) assisted throughout the case.  The APP was essential in retraction and counter traction when needed to make the case progress smoothly.  This retraction and assistance made it possible to see the tissue plans for the procedure.  The assistance was needed for blood control, tissue re-approximation and assisted with closure of the incision site.

## 2024-02-18 NOTE — Discharge Instructions (Addendum)

## 2024-02-18 NOTE — Anesthesia Procedure Notes (Signed)
 Procedure Name: LMA Insertion Date/Time: 02/18/2024 11:37 AM  Performed by: Francie Massing, CRNAPre-anesthesia Checklist: Patient identified, Emergency Drugs available, Suction available and Patient being monitored Patient Re-evaluated:Patient Re-evaluated prior to induction Oxygen Delivery Method: Circle system utilized Preoxygenation: Pre-oxygenation with 100% oxygen Induction Type: IV induction Ventilation: Mask ventilation without difficulty LMA: LMA inserted LMA Size: 4.0 Number of attempts: 1 Airway Equipment and Method: Bite block Placement Confirmation: positive ETCO2 Tube secured with: Tape Dental Injury: Teeth and Oropharynx as per pre-operative assessment

## 2024-02-18 NOTE — Transfer of Care (Signed)
 Immediate Anesthesia Transfer of Care Note  Patient: Angelica Dougherty  Procedure(s) Performed: Procedure(s) (LRB): REMOVAL, TISSUE EXPANDER, BREAST, WITH IMPLANT INSERTION (Bilateral)  Patient Location: PACU  Anesthesia Type: General  Level of Consciousness: awake, oriented, sedated and patient cooperative  Airway & Oxygen Therapy: Patient Spontanous Breathing and Patient connected to face mask oxygen  Post-op Assessment: Report given to PACU RN and Post -op Vital signs reviewed and stable  Post vital signs: Reviewed and stable  Complications: No apparent anesthesia complications  Last Vitals:  Vitals Value Taken Time  BP    Temp    Pulse 105 02/18/24 1251  Resp 15 02/18/24 1251  SpO2 89 % 02/18/24 1251  Vitals shown include unfiled device data.  Last Pain:  Vitals:   02/18/24 1113  TempSrc:   PainSc: 0-No pain      Patients Stated Pain Goal: 3 (02/18/24 1109)  Complications: No notable events documented.

## 2024-02-18 NOTE — Interval H&P Note (Signed)
 History and Physical Interval Note:  02/18/2024 11:18 AM  Angelica Dougherty  has presented today for surgery, with the diagnosis of history of breast cancer.  The various methods of treatment have been discussed with the patient and family. After consideration of risks, benefits and other options for treatment, the patient has consented to  Procedure(s): REMOVAL, TISSUE EXPANDER, BREAST, WITH IMPLANT INSERTION (Bilateral) as a surgical intervention.  The patient's history has been reviewed, patient examined, no change in status, stable for surgery.  I have reviewed the patient's chart and labs.  Questions were answered to the patient's satisfaction.     Alena Bills Konstantinos Cordoba

## 2024-02-18 NOTE — Interval H&P Note (Signed)
 History and Physical Interval Note:  02/18/2024 11:17 AM  Angelica Dougherty  has presented today for surgery, with the diagnosis of history of breast cancer.  The various methods of treatment have been discussed with the patient and family. After consideration of risks, benefits and other options for treatment, the patient has consented to  Procedure(s): REMOVAL, TISSUE EXPANDER, BREAST, WITH IMPLANT INSERTION (Bilateral) as a surgical intervention.  The patient's history has been reviewed, patient examined, no change in status, stable for surgery.  I have reviewed the patient's chart and labs.  Questions were answered to the patient's satisfaction.     Alena Bills Jahnasia Tatum

## 2024-02-18 NOTE — Anesthesia Preprocedure Evaluation (Addendum)
 Anesthesia Evaluation  Patient identified by MRN, date of birth, ID band Patient awake    Reviewed: Allergy & Precautions, NPO status , Patient's Chart, lab work & pertinent test results  History of Anesthesia Complications (+) PONV and history of anesthetic complications  Airway Mallampati: I  TM Distance: >3 FB Neck ROM: Full    Dental no notable dental hx. (+) Teeth Intact, Dental Advisory Given   Pulmonary neg pulmonary ROS   Pulmonary exam normal breath sounds clear to auscultation       Cardiovascular negative cardio ROS Normal cardiovascular exam Rhythm:Regular Rate:Normal  HLD   Neuro/Psych negative neurological ROS  negative psych ROS   GI/Hepatic negative GI ROS, Neg liver ROS,,,  Endo/Other  negative endocrine ROS    Renal/GU negative Renal ROS  negative genitourinary   Musculoskeletal negative musculoskeletal ROS (+)    Abdominal   Peds  Hematology negative hematology ROS (+)   Anesthesia Other Findings   Reproductive/Obstetrics                             Anesthesia Physical Anesthesia Plan  ASA: 2  Anesthesia Plan: General   Post-op Pain Management: Tylenol PO (pre-op)*   Induction: Intravenous  PONV Risk Score and Plan: 4 or greater and Ondansetron, Dexamethasone, Midazolam, Scopolamine patch - Pre-op and TIVA  Airway Management Planned: LMA  Additional Equipment:   Intra-op Plan:   Post-operative Plan: Extubation in OR  Informed Consent: I have reviewed the patients History and Physical, chart, labs and discussed the procedure including the risks, benefits and alternatives for the proposed anesthesia with the patient or authorized representative who has indicated his/her understanding and acceptance.     Dental advisory given  Plan Discussed with: CRNA  Anesthesia Plan Comments:        Anesthesia Quick Evaluation

## 2024-02-19 ENCOUNTER — Encounter (HOSPITAL_BASED_OUTPATIENT_CLINIC_OR_DEPARTMENT_OTHER): Payer: Self-pay | Admitting: Plastic Surgery

## 2024-02-19 ENCOUNTER — Ambulatory Visit: Payer: BC Managed Care – PPO | Admitting: Surgical

## 2024-02-19 DIAGNOSIS — D0511 Intraductal carcinoma in situ of right breast: Secondary | ICD-10-CM

## 2024-02-19 DIAGNOSIS — C50911 Malignant neoplasm of unspecified site of right female breast: Secondary | ICD-10-CM

## 2024-02-19 DIAGNOSIS — Z9013 Acquired absence of bilateral breasts and nipples: Secondary | ICD-10-CM

## 2024-02-19 DIAGNOSIS — Z170 Estrogen receptor positive status [ER+]: Secondary | ICD-10-CM

## 2024-02-19 NOTE — Anesthesia Postprocedure Evaluation (Signed)
 Anesthesia Post Note  Patient: Angelica Dougherty  Procedure(s) Performed: REMOVAL, TISSUE EXPANDER, BREAST, WITH IMPLANT INSERTION (Bilateral: Breast)     Patient location during evaluation: PACU Anesthesia Type: General Level of consciousness: awake and alert Pain management: pain level controlled Vital Signs Assessment: post-procedure vital signs reviewed and stable Respiratory status: spontaneous breathing, nonlabored ventilation, respiratory function stable and patient connected to nasal cannula oxygen Cardiovascular status: blood pressure returned to baseline and stable Postop Assessment: no apparent nausea or vomiting Anesthetic complications: no  No notable events documented.  Last Vitals:  Vitals:   02/18/24 1310 02/18/24 1317  BP: (!) 112/58 127/85  Pulse: 84 85  Resp: 17 15  Temp:  36.5 C  SpO2: 100% 96%    Last Pain:  Vitals:   02/19/24 0935  TempSrc:   PainSc: 1                  Chessa Barrasso L Nefi Musich

## 2024-02-19 NOTE — Progress Notes (Signed)
     Patient ID: Angelica Dougherty, female    DOB: 1971/03/01, 53 y.o.   MRN: [id number]     ICD-10-CM   1. Ductal carcinoma in situ (DCIS) of right breast  D05.11     2. S/P bilateral mastectomy  Z90.13     3. Malignant neoplasm of right breast in female, estrogen receptor positive, unspecified site of breast (HCC)  C50.911    Z17.0        History of Present Illness: Angelica Dougherty is a 53 y.o.  female who presents for virtual post operative evaluation after exchange of bilateral breast tissue expanders for breast implants with Dr.  Ulice Bold yesterday.  The patient gave consent to have this visit done by telemedicine / virtual visit, two identifiers were used to identify patient. This is also consent for access the chart and treat the patient via this visit. The patient is located in [place].  I, the provider, am at the office.  We spent 5 minutes together for the visit.  Joined by telephone.  She reports she is doing really well, reports that pain has overall been well-controlled with p.o. medications.  She is not having any specific issues or concerns at this time.  She does have questions about showering protocol and dressings.  She reports she has been up and walking without issue.  She is accompanied by family who has been assisting her with recovery.  Assessment/Plan:  She is doing really well, she is not having any current issues with recovery.  We will plan to see her for follow-up next week.  She is aware to call with questions or concerns or if any symptomatic changes occur.  Recommend continuing to avoid strenuous activities/heavy lifting

## 2024-02-27 ENCOUNTER — Encounter: Payer: Self-pay | Admitting: Plastic Surgery

## 2024-02-27 ENCOUNTER — Ambulatory Visit: Payer: BC Managed Care – PPO | Admitting: Plastic Surgery

## 2024-02-27 VITALS — BP 132/71 | HR 65

## 2024-02-27 DIAGNOSIS — Z9013 Acquired absence of bilateral breasts and nipples: Secondary | ICD-10-CM

## 2024-02-27 DIAGNOSIS — Z170 Estrogen receptor positive status [ER+]: Secondary | ICD-10-CM

## 2024-02-27 DIAGNOSIS — C50911 Malignant neoplasm of unspecified site of right female breast: Secondary | ICD-10-CM

## 2024-02-27 NOTE — Progress Notes (Signed)
 The patient is a 53 year old female here for follow-up after undergoing bilateral breast reconstruction.  Her last surgery was March 12.  She had the expanders removed and implants placed.  She has 375 Mentor smooth round ultra high-profile gel implants on both sides.  Overall she is very happy with her results.  There is no sign of a hematoma or seroma.  The skin is healing very nicely.  She will be slated for radiation therapy coming up.  Continue with sports bra.  Will plan to see her back in 2 weeks.  She is not interested in nipple areolar reconstruction at this time.  Pictures were obtained of the patient and placed in the chart with the patient's or guardian's permission.

## 2024-03-02 ENCOUNTER — Encounter: Payer: BC Managed Care – PPO | Admitting: Plastic Surgery

## 2024-03-03 ENCOUNTER — Ambulatory Visit: Admitting: Occupational Therapy

## 2024-03-04 ENCOUNTER — Ambulatory Visit
Admission: RE | Admit: 2024-03-04 | Discharge: 2024-03-04 | Disposition: A | Discharge status: Home / Self Care | Source: Ambulatory Visit | Attending: Internal Medicine | Admitting: Internal Medicine

## 2024-03-04 ENCOUNTER — Encounter: Payer: Self-pay | Admitting: Radiation Oncology

## 2024-03-04 VITALS — BP 137/80 | Temp 97.7°F | Resp 16 | Wt 184.0 lb

## 2024-03-04 DIAGNOSIS — Z170 Estrogen receptor positive status [ER+]: Secondary | ICD-10-CM | POA: Diagnosis not present

## 2024-03-04 DIAGNOSIS — C50412 Malignant neoplasm of upper-outer quadrant of left female breast: Secondary | ICD-10-CM | POA: Insufficient documentation

## 2024-03-04 NOTE — Progress Notes (Signed)
 Radiation Oncology Follow up Note  Name: Angelica Dougherty   Date:   03/04/2024 MRN:  [id number] DOB: 04/27/1971    This 53 y.o. 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  She had undergone left mastectomy with multifocal disease but close margins and 1 sentinel lymph node with isolated tumor cells.  At time I recommended postoperative radiation therapy to her left chest wall and peripheral lymphatics.  Her Oncotype DX came back at 25 she is discussed that with medical oncology she will not have systemic treatment.  She is completed bilateral breast reconstruction by Dr. Ulice Bold.  She has had her expanders removed and implants placed.  She has had an excellent cosmetic result with bilateral breast reconstruction.  Patient is not interested in nipple areolar reconstruction at this time.  She is doing well she states her only limitation is moving her left arm in an upward position.  She is working with physical therapy on that.  COMPLICATIONS OF TREATMENT: none  FOLLOW UP COMPLIANCE: keeps appointments   PHYSICAL EXAM:  BP 137/80   Temp 97.7 F (36.5 C) (Tympanic)   Resp 16   Wt 184 lb (83.5 kg)   LMP 06/08/2006 (Approximate) Comment: Hysterectomy  BMI 24.28 kg/m  Patient status post bilateral breast reconstruction with excellent cosmetic result.  No dominant masses noted in either reconstructed breast no axillary or supraclavicular adenopathy is appreciated.  Well-developed well-nourished patient in NAD. HEENT reveals PERLA, EOMI, discs not visualized.  Oral cavity is clear. No oral mucosal lesions are identified. Neck is  clear without evidence of cervical or supraclavicular adenopathy. Lungs are clear to A&P. Cardiac examination is essentially unremarkable with regular rate and rhythm without murmur rub or thrill. Abdomen is benign with no organomegaly or masses noted. Motor sensory and DTR levels are equal and symmetric in the upper and lower extremities. Cranial nerves II through XII are grossly intact. Proprioception is intact. No peripheral adenopathy or edema is identified. No motor or sensory levels are noted. Crude visual fields are within normal range.  RADIOLOGY RESULTS: No current films for review  PLAN: At this time I will out about another week and a half of healing and then set her up for simulation.  Will plan on delivering 5040 cGy 28 fractions to both her left reconstructed breast based on her close margins as well as her peripheral lymphatics.  Risks and benefits of treatment occluding skin reaction fatigue alteration of blood counts slight chance I would estimated less than 5% of lymphedema in her left upper extremity all were discussed in detail with the patient.  I have personally set up simulation.  Patient comprehends my recommendations well.  She also will be a candidate for endocrine therapy after completion of radiation.  I would like to take this opportunity to thank you for allowing me to participate in the care of your patient.[name], MD

## 2024-03-09 DIAGNOSIS — Z170 Estrogen receptor positive status [ER+]: Secondary | ICD-10-CM | POA: Diagnosis not present

## 2024-03-09 DIAGNOSIS — C50412 Malignant neoplasm of upper-outer quadrant of left female breast: Secondary | ICD-10-CM | POA: Diagnosis not present

## 2024-03-10 ENCOUNTER — Ambulatory Visit: Attending: Internal Medicine | Admitting: Occupational Therapy

## 2024-03-10 DIAGNOSIS — M25611 Stiffness of right shoulder, not elsewhere classified: Secondary | ICD-10-CM | POA: Insufficient documentation

## 2024-03-10 DIAGNOSIS — M25612 Stiffness of left shoulder, not elsewhere classified: Secondary | ICD-10-CM | POA: Insufficient documentation

## 2024-03-10 DIAGNOSIS — R293 Abnormal posture: Secondary | ICD-10-CM | POA: Insufficient documentation

## 2024-03-10 NOTE — Therapy (Signed)
 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 [place]   BREAST BIOPSY Right 09/25/2023   stereo bx, calcs, RIBBON clip-path pending   BREAST BIOPSY Left 09/25/2023   [place] bx, savi tag as marker, path pending   BREAST BIOPSY Right 09/25/2023   MM RT BREAST BX W LOC DEV 1ST LESION IMAGE BX SPEC STEREO GUIDE 09/25/2023 [facility]   BREAST BIOPSY Left 09/25/2023   [place] LT BREAST BX W LOC DEV 1ST LESION IMG BX SPEC US GUIDE 09/25/2023 [facility]   BREAST EXCISIONAL BIOPSY Right 1993   benign   BREAST LUMPECTOMY Right 1991   benign   BREAST RECONSTRUCTION WITH PLACEMENT OF TISSUE EXPANDER AND FLEX HD (ACELLULAR HYDRATED DERMIS) Bilateral 12/22/2023   Procedure: BREAST RECONSTRUCTION WITH PLACEMENT OF TISSUE EXPANDER AND FLEX HD (ACELLULAR HYDRATED DERMIS);  Surgeon: Peggye Form, DO;  Location: [facility];  Service: Plastics;  Laterality: Bilateral;   CESAREAN SECTION  12/16/2005    MASTECTOMY W/ SENTINEL NODE BIOPSY Bilateral 12/22/2023   Procedure: MASTECTOMY WITH SENTINEL LYMPH NODE BIOPSY, simple mastectomy;  Surgeon: Kandis Cocking, MD;  Location: [facility];  Service: General;  Laterality: Bilateral;   REMOVAL OF TISSUE EXPANDER AND PLACEMENT OF IMPLANT Bilateral 02/18/2024   Procedure: REMOVAL, TISSUE EXPANDER, BREAST, WITH IMPLANT INSERTION;  Surgeon: Peggye Form, DO;  Location: [facility];  Service: Plastics;  Laterality: Bilateral;   Patient Active Problem List   Diagnosis Date Noted   Breast cancer, right (HCC) 01/20/2024   S/P bilateral mastectomy 12/22/2023   Genetic testing 10/28/2023   Breast cancer, left (HCC) 09/30/2023   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   Annual physical exam 08/15/2023   Screening mammogram for breast cancer 08/15/2023   Screen for colon cancer 08/15/2023   Post-menopausal 08/15/2023   HLD (hyperlipidemia) 08/03/2007    PCP: Suzie Portela NP  REFERRING PROVIDER: DR Geryl Councilman DIAG: Breast CA with bil lumpectomy  THERAPY DIAG:  Abnormal posture  Shoulder joint stiffness, bilateral  Rationale for Evaluation and Treatment: Rehabilitation  ONSET DATE: 09/30/23  SUBJECTIVE:  SUBJECTIVE STATEMENT: I had expanders removed and the silicone implants done on 02/18/2024.  It feels not as tight.  Much better.  Started doing some motion.  Dr. Ulice Bold said nothing more than 10 pounds lifting up.  And I can do range of motion.   PERTINENT HISTORY:  Patient was diagnosed with bilateral  breast cancer -patient had bilateral mastectomy with same time reconstruction with expanders by Dr. Dillingham-12/22/2023 surgery was done.  With the last visit no expansion was done.  3 lymph nodes removed on the right and 6 lymph nodes  removed from the left.  Exchange of expanders for silicone implants was done on 02/18/2024 by Dr. Ulice Bold.  Radiation starting next week. PATIENT GOALS:   reduce lymphedema risk and learn post op HEP.   PAIN:  Are you having pain? No  PRECAUTIONS: Active CA     HAND DOMINANCE: left  WEIGHT BEARING RESTRICTIONS: No  FALLS:  Has patient fallen in last 6 months? 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   LEISURE: read , spend time with daughter    OBJECTIVE:  COGNITION: Overall cognitive status: Within functional limits for tasks assessed    POSTURE:  Forward head and rounded shoulders posture  UPPER EXTREMITY AROM/PROM:  A/PROM RIGHT   eval  R 01/21/24 R 01/23/24 R 01/26/24 R  02/09/24 R 03/10/24  Shoulder extension        Shoulder flexion 165 112 112 125 125 In session 150 115  Shoulder abduction 165 95 90 95 92 ( in session 109 105  Shoulder internal rotation        Shoulder external rotation 85 80 85 85 84 Within functional limits behind head    (Blank rows = not tested)  A/PROM LEFT   eval L 01/21/24 L 01/23/24 L 01/26/24 L 02/09/24 L 03/10/24  Shoulder extension        Shoulder flexion 165 108 90( end of session 100) 102 100 in session 115 102  Shoulder abduction 165 90 70( end of session 90) 85 82 in session 94 105  Shoulder internal rotation        Shoulder external rotation 85 75 80 80 72 70    (Blank rows = not tested)  CERVICAL AROM: All within normal limits:     UPPER EXTREMITY STRENGTH: Not tested patient is about 4 weeks postop  LYMPHEDEMA ASSESSMENTS:   LANDMARK RIGHT   01/21/24 R  02/08/26  10 cm proximal to olecranon process 28.2 28.2  Olecranon process 26.5  26.5  10 cm proximal to ulnar styloid process 22.1 at 15 cm    Just proximal to ulnar styloid process    Across hand at thumb web space    At base of 2nd digit    (Blank rows = not  tested)  LANDMARK LEFT   01/21/24 L 02/09/24  10 cm proximal to olecranon process 28.5 28.7.  Olecranon process 26.5 26.6  10 cm proximal to ulnar styloid process 22.4 at 15 cm    Just proximal to ulnar styloid process    Across hand at thumb web space    At base of 2nd digit    (Blank rows = not tested)  L-DEX LYMPHEDEMA SCREENING:  The patient was assessed using the L-Dex machine today to produce a lymphedema index baseline score. The patient will be reassessed on a regular basis (typically every 3 months) to obtain new  L-Dex scores. If the score is > 6.5 points away from his/her baseline score indicating onset of subclinical lymphedema, it will be recommended to wear a compression garment for 4 weeks, 12 hours per day and then be reassessed. If the score continues to be > 6.5 points from baseline at reassessment, we will initiate lymphedema treatment. Assessing in this manner has a 95% rate of preventing clinically significant lymphedema.  5.6 score - L UE at risk  R UE dominant - no lymph nodes remove L-Dex score at baseline NOTE DONE - Pt ended up had lymph node removed on both sides.   PATIENT EDUCATION:  Education details: Lymphedema risk reduction and post op shoulder/posture HEP Person educated: Patient and daughter Education method: Explanation, Demonstration, Handout Education comprehension: Patient verbalized understanding and returned demonstration Session 03/10/24:  Patient present today in session after not being seen since prior to surgery.   Patient had on 02/18/2024 done expanders removed and silicone implant put in bilaterally.  Patient has a follow-up appointment with Dr. Ulice Bold.  Plan is to start radiation next week. Patient present with less tightness discomfort and pain. Patient show increased active range of motion in bilateral shoulders compared to prior to expanders. Reviewed with patient again home exercises for shoulder flexion and abduction and external  rotation keeping it pain-free under 1-2/10. Patient can do 2-3 times a day 10 reps. Encourage patient to do in supine using gravity. Patient show great progress in session in the right more than the left. Patient to work on external rotation overhead working into radiation position. Patient limited on the left by scar tissue on the anterior pectoral muscle   ASSESSMENT:  CLINICAL IMPRESSION: Patient presented at follow-up for OT postop 3 weeks postop extensor swab for silicone implants.  By Dr. Ulice Bold.  Bilateral mastectomy with immediate reconstruction and expanders placement was originally done 12/22/2023.Marland Kitchen  Patient had 3 lymph nodes removed from the right and 6 on the left.  Patient show increased active range of motion compared to prior to surgery.  Reviewed with patient home exercises for active assisted range of motion for shoulder flexion and abduction external rotation.  Keeping it with pain less than a 1-2/10.  Encouraging to follow surgeon's precautions. Patient can benefit from skilled OT services to increase motion and strength in bilateral upper extremity and monitor her for lymphedema symptoms and signs.  To return to prior level of function.    Pt will benefit from skilled therapeutic intervention to improve on the following deficits: Decreased knowledge of precautions and lymphedema education, impaired UE functional use, pain, decreased ROM, postural dysfunction.   OT treatment/interventions: ADL/self-care home management, pt/family education, therapeutic exercise,manual therapy  REHAB POTENTIAL: Good  CLINICAL DECISION MAKING: Stable/uncomplicated  EVALUATION COMPLEXITY: Low   GOALS: Goals reviewed with patient? YES  LONG TERM GOALS: (STG=LTG)    Name Target Date Goal status  1 Pt will be able to verbalize understanding of pertinent lymphedema risk reduction practices relevant to her dx specifically related to skin care.  Baseline:  No knowledge 12 weeks ONGOING  2  Pt will be able to return demo and/or verbalize understanding of the post op HEP related to regaining shoulder ROM. Baseline:  No knowledge 12 weeks Ongoing       4 Pt will demo she has regained full shoulder ROM and function post operatively compared to baselines.  Baseline: See objective measurements taken today. 12 wks  ONGOING     PLAN:  OT FREQUENCY/DURATION: 6 visits 12 weeks  Occupational Therapy Information for After Breast Cancer Surgery/Treatment:  Lymphedema is a swelling condition that you may be at risk for in your arm if you have lymph nodes removed from the armpit area.  After a sentinel node biopsy, the risk is approximately 5-9% and is higher after an axillary node dissection.  There is treatment available for this condition and it is not life-threatening.  Contact your physician or occupational therapist with concerns. You may begin the 4 shoulder/posture exercises (see additional sheet) when permitted by your physician (typically a week after surgery).  If you have drains, you may need to wait until those are removed before beginning range of motion exercises.  A general recommendation is to not lift your arms above shoulder height until drains are removed.  These exercises should be done to your tolerance and gently.  This is not a "no pain/no gain" type of recovery so listen to your body and stretch into the range of motion that you can tolerate, stopping if you have pain.  If you are having immediate reconstruction, ask your plastic surgeon about doing exercises as he or she may want you to wait. We encourage you to watch the ABC (After Breast Cancer)  video. You will learn information related to lymphedema risk, prevention and treatment and additional exercises to regain mobility following surgery.  While undergoing any medical procedure or treatment, try to avoid blood pressure being taken or needle sticks from occurring on the arm on the side of cancer.   This  recommendation begins after surgery and continues for the rest of your life.  This may help reduce your risk of getting lymphedema (swelling in your arm). An excellent resource for those seeking information on lymphedema is the National Lymphedema Network's web site. It can be accessed at www.lymphnet.org If you notice swelling in your hand, arm or breast at any time following surgery (even if it is many years from now), please contact your doctor or occupational therapist to discuss this.  Lymphedema can be treated at any time but it is easier for you if it is treated early on.  If you feel like your shoulder motion is not returning to normal in a reasonable amount of time, please contact your surgeon or occupational therapist.  [facility] [phone]. [address] [place], [place] [address]      Oletta Cohn, OTR/L,CLT 03/10/2024, 4:51 PM

## 2024-03-15 ENCOUNTER — Encounter: Payer: Self-pay | Admitting: General Surgery

## 2024-03-15 ENCOUNTER — Ambulatory Visit: Admitting: Surgical

## 2024-03-15 VITALS — BP 143/83 | HR 76

## 2024-03-15 DIAGNOSIS — Z170 Estrogen receptor positive status [ER+]: Secondary | ICD-10-CM

## 2024-03-15 DIAGNOSIS — Z9013 Acquired absence of bilateral breasts and nipples: Secondary | ICD-10-CM

## 2024-03-15 DIAGNOSIS — C50911 Malignant neoplasm of unspecified site of right female breast: Secondary | ICD-10-CM

## 2024-03-15 DIAGNOSIS — D0511 Intraductal carcinoma in situ of right breast: Secondary | ICD-10-CM

## 2024-03-15 NOTE — Progress Notes (Signed)
 Patient is a very pleasant 53 year old female here for follow-up after exchange of bilateral breast tissue expanders for breast implants.  Surgery was 02/18/2024.  She is just shy of 4 weeks postop.  She had 375 [employer] smooth round ultrahigh profile gel implants placed bilaterally.   She reports she is scheduled to see radiation oncology on Wednesday of this week for evaluation and initial radiation mapping.  She is going to have the left axillary radiated for positive lymph node.  She reports she is healing well in regards to recovery from expander to implant exchange.  She is overall happy with her results.  She is not having any infectious symptoms.  Chaperone present on exam On exam bilateral breast incisions appear intact and appear to be healing well.  She does have Steri-Strips in place which are well adhered at this point.  There is no erythema or cellulitic changes noted of either breast.  No subcutaneous fluid collection noted.  She does have good symmetry overall, does have some scarring of the superior left breast pole which is causing some asymmetry in the upper pole.  A/P:  Patient is doing well, no signs infection or concern on exam.  Recommend continue with compressive garments, avoiding strenuous activities or heavy lifting for 2 more weeks.  She is scheduled to begin radiation mapping Wednesday of this week, discussed with patient that if radiation could be held for 1 additional week, I think this would be beneficial for her recovery from the exchange surgery, but ultimately discussed with patient that of course her radiation treatment is most important.  We did discuss that because it is in her axilla the risk of complications related to the left breast incision is significantly lower.  Will plan to talk with patient on Thursday or Friday of this week via telephone virtual visit to discuss radiation appointment.  Recommend calling with questions or concerns.

## 2024-03-17 ENCOUNTER — Ambulatory Visit
Admission: RE | Admit: 2024-03-17 | Discharge: 2024-03-17 | Disposition: A | Discharge status: Home / Self Care | Source: Ambulatory Visit | Attending: Internal Medicine | Admitting: Internal Medicine

## 2024-03-17 ENCOUNTER — Encounter: Payer: Self-pay | Admitting: *Deleted

## 2024-03-17 ENCOUNTER — Inpatient Hospital Stay: Admitting: Occupational Therapy

## 2024-03-17 ENCOUNTER — Encounter: Payer: Self-pay | Admitting: General Surgery

## 2024-03-17 DIAGNOSIS — Z1732 Human epidermal growth factor receptor 2 negative status: Secondary | ICD-10-CM | POA: Insufficient documentation

## 2024-03-17 DIAGNOSIS — M25611 Stiffness of right shoulder, not elsewhere classified: Secondary | ICD-10-CM

## 2024-03-17 DIAGNOSIS — R293 Abnormal posture: Secondary | ICD-10-CM

## 2024-03-17 DIAGNOSIS — Z803 Family history of malignant neoplasm of breast: Secondary | ICD-10-CM | POA: Insufficient documentation

## 2024-03-17 DIAGNOSIS — Z9013 Acquired absence of bilateral breasts and nipples: Secondary | ICD-10-CM | POA: Insufficient documentation

## 2024-03-17 DIAGNOSIS — Z825 Family history of asthma and other chronic lower respiratory diseases: Secondary | ICD-10-CM | POA: Insufficient documentation

## 2024-03-17 DIAGNOSIS — E785 Hyperlipidemia, unspecified: Secondary | ICD-10-CM | POA: Insufficient documentation

## 2024-03-17 DIAGNOSIS — Z79899 Other long term (current) drug therapy: Secondary | ICD-10-CM | POA: Insufficient documentation

## 2024-03-17 DIAGNOSIS — D0511 Intraductal carcinoma in situ of right breast: Secondary | ICD-10-CM | POA: Insufficient documentation

## 2024-03-17 DIAGNOSIS — Z885 Allergy status to narcotic agent status: Secondary | ICD-10-CM | POA: Insufficient documentation

## 2024-03-17 DIAGNOSIS — C50412 Malignant neoplasm of upper-outer quadrant of left female breast: Secondary | ICD-10-CM | POA: Diagnosis not present

## 2024-03-17 DIAGNOSIS — N6022 Fibroadenosis of left breast: Secondary | ICD-10-CM | POA: Insufficient documentation

## 2024-03-17 DIAGNOSIS — N83202 Unspecified ovarian cyst, left side: Secondary | ICD-10-CM | POA: Insufficient documentation

## 2024-03-17 DIAGNOSIS — Z90710 Acquired absence of both cervix and uterus: Secondary | ICD-10-CM | POA: Insufficient documentation

## 2024-03-17 DIAGNOSIS — Z1721 Progesterone receptor positive status: Secondary | ICD-10-CM | POA: Insufficient documentation

## 2024-03-17 DIAGNOSIS — Z170 Estrogen receptor positive status [ER+]: Secondary | ICD-10-CM | POA: Insufficient documentation

## 2024-03-17 DIAGNOSIS — Z808 Family history of malignant neoplasm of other organs or systems: Secondary | ICD-10-CM | POA: Insufficient documentation

## 2024-03-17 NOTE — Therapy (Signed)
 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 [place]   BREAST BIOPSY Right 09/25/2023   stereo bx, calcs, RIBBON clip-path pending   BREAST BIOPSY Left 09/25/2023   [place] bx, savi tag as marker, path pending   BREAST BIOPSY Right 09/25/2023   MM RT BREAST BX W LOC DEV 1ST LESION IMAGE BX SPEC STEREO GUIDE 09/25/2023 [facility]   BREAST BIOPSY Left 09/25/2023   [place] LT BREAST BX W LOC DEV 1ST LESION IMG BX SPEC US GUIDE 09/25/2023 [facility]   BREAST EXCISIONAL BIOPSY Right 1993   benign   BREAST LUMPECTOMY Right 1991   benign   BREAST RECONSTRUCTION WITH PLACEMENT OF TISSUE EXPANDER AND FLEX HD (ACELLULAR HYDRATED DERMIS) Bilateral 12/22/2023   Procedure: BREAST RECONSTRUCTION WITH PLACEMENT OF TISSUE EXPANDER AND FLEX HD (ACELLULAR HYDRATED DERMIS);  Surgeon: Peggye Form, DO;  Location: [facility];  Service: Plastics;  Laterality: Bilateral;   CESAREAN SECTION  12/16/2005    MASTECTOMY W/ SENTINEL NODE BIOPSY Bilateral 12/22/2023   Procedure: MASTECTOMY WITH SENTINEL LYMPH NODE BIOPSY, simple mastectomy;  Surgeon: Kandis Cocking, MD;  Location: [facility];  Service: General;  Laterality: Bilateral;   REMOVAL OF TISSUE EXPANDER AND PLACEMENT OF IMPLANT Bilateral 02/18/2024   Procedure: REMOVAL, TISSUE EXPANDER, BREAST, WITH IMPLANT INSERTION;  Surgeon: Peggye Form, DO;  Location: [facility];  Service: Plastics;  Laterality: Bilateral;   Patient Active Problem List   Diagnosis Date Noted   Breast cancer, right (HCC) 01/20/2024   S/P bilateral mastectomy 12/22/2023   Genetic testing 10/28/2023   Breast cancer, left (HCC) 09/30/2023   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   Annual physical exam 08/15/2023   Screening mammogram for breast cancer 08/15/2023   Screen for colon cancer 08/15/2023   Post-menopausal 08/15/2023   HLD (hyperlipidemia) 08/03/2007    PCP: Suzie Portela NP  REFERRING PROVIDER: DR Geryl Councilman DIAG: Breast CA with bil lumpectomy  THERAPY DIAG:  Abnormal posture  Shoulder joint stiffness, bilateral  Rationale for Evaluation and Treatment: Rehabilitation  ONSET DATE: 09/30/23  SUBJECTIVE:  SUBJECTIVE STATEMENT: I have seen the surgeon since I seen you last.  They wanted to hold off on radiation for another week.  They did make my mold today.  By the exercise doing well.  The left still little tighter than the right.  But doing better.  Did fly to [place] with my daughter to look at college. PERTINENT HISTORY:  Patient was diagnosed with bilateral  breast cancer -patient had bilateral mastectomy with same time reconstruction with expanders by Dr. Dillingham-12/22/2023 surgery was done.  With the last visit no expansion was done.   3 lymph nodes removed on the right and 6 lymph nodes removed from the left.  Exchange of expanders for silicone implants was done on 02/18/2024 by Dr. Ulice Bold.  Radiation starting next week. PATIENT GOALS:   reduce lymphedema risk and learn post op HEP.   PAIN:  Are you having pain? No  PRECAUTIONS: Active CA     HAND DOMINANCE: left  WEIGHT BEARING RESTRICTIONS: No  FALLS:  Has patient fallen in last 6 months? 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   LEISURE: read , spend time with daughter    OBJECTIVE:  COGNITION: Overall cognitive status: Within functional limits for tasks assessed    POSTURE:  Forward head and rounded shoulders posture  UPPER EXTREMITY AROM/PROM:  A/PROM RIGHT   eval  R 01/21/24 R 01/23/24 R 01/26/24 R  02/09/24 R 03/10/24 R 03/17/24  Shoulder extension         Shoulder flexion 165 112 112 125 125 In session 150 115 160  Shoulder abduction 165 95 90 95 92 ( in session 109 105 135  Shoulder internal rotation         Shoulder external rotation 85 80 85 85 84 Within functional limits behind head 90 behind head    (Blank rows = not tested)  A/PROM LEFT   eval L 01/21/24 L 01/23/24 L 01/26/24 L 02/09/24 L 03/10/24 L 03/17/24  Shoulder extension         Shoulder flexion 165 108 90( end of session 100) 102 100 in session 115 102 125  Shoulder abduction 165 90 70( end of session 90) 85 82 in session 94 105 110  Shoulder internal rotation         Shoulder external rotation 85 75 80 80 72 70 70    (Blank rows = not tested)  CERVICAL AROM: All within normal limits:     UPPER EXTREMITY STRENGTH: Not tested patient is about 4 weeks postop  LYMPHEDEMA ASSESSMENTS:   LANDMARK RIGHT   01/21/24 R  02/08/26  10 cm proximal to olecranon process 28.2 28.2  Olecranon process 26.5  26.5  10 cm proximal to ulnar styloid process 22.1 at 15 cm    Just proximal to  ulnar styloid process    Across hand at thumb web space    At base of 2nd digit    (Blank rows = not tested)  LANDMARK LEFT   01/21/24 L 02/09/24  10 cm proximal to olecranon process 28.5 28.7.  Olecranon process 26.5 26.6  10 cm proximal to ulnar styloid process 22.4 at 15 cm    Just proximal to ulnar styloid process    Across hand at thumb web space    At base of 2nd digit    (Blank rows = not tested)  L-DEX LYMPHEDEMA SCREENING:  The patient was  assessed using the L-Dex machine today to produce a lymphedema index baseline score. The patient will be reassessed on a regular basis (typically every 3 months) to obtain new L-Dex scores. If the score is > 6.5 points away from his/her baseline score indicating onset of subclinical lymphedema, it will be recommended to wear a compression garment for 4 weeks, 12 hours per day and then be reassessed. If the score continues to be > 6.5 points from baseline at reassessment, we will initiate lymphedema treatment. Assessing in this manner has a 95% rate of preventing clinically significant lymphedema.  5.6 score - L UE at risk  R UE dominant - no lymph nodes remove L-Dex score at baseline NOTE DONE - Pt ended up had lymph node removed on both sides.   PATIENT EDUCATION:  Education details: Lymphedema risk reduction and post op shoulder/posture HEP Person educated: Patient and daughter Education method: Explanation, Demonstration, Handout Education comprehension: Patient verbalized understanding and returned demonstration Session 03/17/24:    Patient had on 02/18/2024 done expanders removed and silicone implant put in bilaterally.  Patient's last surgeons appointment recommended for patient to not start radiation for another week.   She was fitted with remold today.   Patient present with less tightness discomfort and pain. Patient was seen last week and reviewed with home exercises again. Patient show increased active range of motion in bilateral  shoulders compared to last time Continue to move more tight on the left than the right. Reviewed with patient again home exercises for shoulder flexion and abduction and external rotation keeping it pain-free under 1-2/10. Patient can do 2-3 times a day 10 reps. Encourage patient to continue do in supine using gravity. Patient show great progress in session again  Patient to work on external rotation overhead working into radiation position. Patient limited on the left by scar tissue on the anterior pectoral muscle   ASSESSMENT:  CLINICAL IMPRESSION: Patient had Bilateral mastectomy with immediate reconstruction and expanders placement was originally done 12/22/2023.Marland Kitchen  Patient had 3 lymph nodes removed from the right and 6 on the left.  Patient had 02/18/2024 expanders removed and silicone implants placed.  Patient starting radiation next week.  Patient's active range of motion in bilateral shoulder improved since followed up last week.  Patient to continue address and work on home program in supine for shoulder flexion and abduction external rotation.. Patient can benefit from skilled OT services to increase motion and strength in bilateral upper extremity and monitor her for lymphedema symptoms and signs.  To return to prior level of function.    Pt will benefit from skilled therapeutic intervention to improve on the following deficits: Decreased knowledge of precautions and lymphedema education, impaired UE functional use, pain, decreased ROM, postural dysfunction.   OT treatment/interventions: ADL/self-care home management, pt/family education, therapeutic exercise,manual therapy  REHAB POTENTIAL: Good  CLINICAL DECISION MAKING: Stable/uncomplicated  EVALUATION COMPLEXITY: Low   GOALS: Goals reviewed with patient? YES  LONG TERM GOALS: (STG=LTG)    Name Target Date Goal status  1 Pt will be able to verbalize understanding of pertinent lymphedema risk reduction practices relevant to  her dx specifically related to skin care.  Baseline:  No knowledge 12 weeks ONGOING  2 Pt will be able to return demo and/or verbalize understanding of the post op HEP related to regaining shoulder ROM. Baseline:  No knowledge 12 weeks Ongoing       4 Pt will demo she has regained full shoulder ROM and function post operatively  compared to baselines.  Baseline: See objective measurements taken today. 12 wks  ONGOING     PLAN:  OT FREQUENCY/DURATION: 6 visits 12 weeks     Occupational Therapy Information for After Breast Cancer Surgery/Treatment:  Lymphedema is a swelling condition that you may be at risk for in your arm if you have lymph nodes removed from the armpit area.  After a sentinel node biopsy, the risk is approximately 5-9% and is higher after an axillary node dissection.  There is treatment available for this condition and it is not life-threatening.  Contact your physician or occupational therapist with concerns. You may begin the 4 shoulder/posture exercises (see additional sheet) when permitted by your physician (typically a week after surgery).  If you have drains, you may need to wait until those are removed before beginning range of motion exercises.  A general recommendation is to not lift your arms above shoulder height until drains are removed.  These exercises should be done to your tolerance and gently.  This is not a "no pain/no gain" type of recovery so listen to your body and stretch into the range of motion that you can tolerate, stopping if you have pain.  If you are having immediate reconstruction, ask your plastic surgeon about doing exercises as he or she may want you to wait. We encourage you to watch the ABC (After Breast Cancer)  video. You will learn information related to lymphedema risk, prevention and treatment and additional exercises to regain mobility following surgery.  While undergoing any medical procedure or treatment, try to avoid blood pressure being  taken or needle sticks from occurring on the arm on the side of cancer.   This recommendation begins after surgery and continues for the rest of your life.  This may help reduce your risk of getting lymphedema (swelling in your arm). An excellent resource for those seeking information on lymphedema is the National Lymp[employer] Network's web site. It can be accessed at www.lymphnet.org If you notice swelling in your hand, arm or breast at any time following surgery (even if it is many years from now), please contact your doctor or occupational therapist to discuss this.  Lymphedema can be treated at any time but it is easier for you if it is treated early on.  If you feel like your shoulder motion is not returning to normal in a reasonable amount of time, please contact your surgeon or occupational therapist.  [facility] [phone]. [address] [place], [place] [address]      Oletta Cohn, OTR/L,CLT 03/17/2024, 5:02 PM

## 2024-03-18 ENCOUNTER — Ambulatory Visit: Admitting: Surgical

## 2024-03-18 DIAGNOSIS — Z9013 Acquired absence of bilateral breasts and nipples: Secondary | ICD-10-CM

## 2024-03-18 DIAGNOSIS — D0511 Intraductal carcinoma in situ of right breast: Secondary | ICD-10-CM

## 2024-03-18 DIAGNOSIS — C50412 Malignant neoplasm of upper-outer quadrant of left female breast: Secondary | ICD-10-CM | POA: Diagnosis not present

## 2024-03-18 DIAGNOSIS — Z170 Estrogen receptor positive status [ER+]: Secondary | ICD-10-CM | POA: Diagnosis not present

## 2024-03-18 DIAGNOSIS — C50911 Malignant neoplasm of unspecified site of right female breast: Secondary | ICD-10-CM

## 2024-03-18 NOTE — Progress Notes (Signed)
 Patient is a very pleasant 53 year old female who presents via telephone visit to discuss upcoming radiation.  She has underwent bilateral breast reconstruction, underwent exchange of tissue expander to implant on 02/18/2024.  She has seen radiation oncology, completed mapping.  She is scheduled to begin radiation in the next few weeks.  She does not have any specific questions or concerns related to her reconstruction.  We discussed following up after completing radiation to check-in.  We discussed that she certainly can follow-up at any point if she has any questions concerns or changes.  The patient gave consent to have this visit done by telemedicine / virtual visit, two identifiers were used to identify patient. This is also consent for access the chart and treat the patient via this visit. The patient is located in [place].  I, the provider, am at the office.  We spent 5 minutes together for the visit.  Joined by telephone

## 2024-03-23 ENCOUNTER — Encounter: Payer: Self-pay | Admitting: Internal Medicine

## 2024-03-23 ENCOUNTER — Inpatient Hospital Stay: Payer: BC Managed Care – PPO | Admitting: Internal Medicine

## 2024-03-23 VITALS — BP 116/71 | HR 62 | Temp 97.9°F | Resp 18 | Wt 185.0 lb

## 2024-03-23 DIAGNOSIS — C50412 Malignant neoplasm of upper-outer quadrant of left female breast: Secondary | ICD-10-CM | POA: Diagnosis not present

## 2024-03-23 DIAGNOSIS — C50911 Malignant neoplasm of unspecified site of right female breast: Secondary | ICD-10-CM

## 2024-03-23 DIAGNOSIS — Z170 Estrogen receptor positive status [ER+]: Secondary | ICD-10-CM

## 2024-03-23 DIAGNOSIS — D0511 Intraductal carcinoma in situ of right breast: Secondary | ICD-10-CM

## 2024-03-23 DIAGNOSIS — C50912 Malignant neoplasm of unspecified site of left female breast: Secondary | ICD-10-CM

## 2024-03-23 NOTE — Progress Notes (Signed)
  Cancer Center CONSULT NOTE  Patient Care Team: Jacky Kindle, FNP as PCP - General (Family Medicine) Hulen Luster, RN as Oncology Nurse Navigator [name], MD as Consulting Physician (Radiation Oncology) Michaelyn Barter, MD as Consulting Physician (Oncology)  REFERRING PROVIDER: Merita Norton   REASON FOR REFFERAL: Right breast DCIS, left breast IDC  CANCER STAGING   Cancer Staging  Breast cancer, left [facility]) Staging form: Breast, AJCC 8th Edition - Clinical: Stage IIA (cT2, cN0, cM0, G3, ER+, PR+, HER2-) - Signed by Michaelyn Barter, MD on 09/30/2023 Histologic grading system: 3 grade system - Pathologic stage from 12/22/2023: Stage IA (pT1c, pN0(i+)(sn), cM0, G3, ER+, PR+, HER2-) - Signed by Michaelyn Barter, MD on 01/20/2024 Stage prefix: Initial diagnosis Method of lymph node assessment: Sentinel lymph node biopsy Multigene prognostic tests performed: Oncotype DX Histologic grading system: 3 grade system  Breast cancer, right [facility]) Staging form: Breast, AJCC 8th Edition - Pathologic stage from 12/22/2023: pT1c, pN0, cM0, ER+, PR+, HER2- - Signed by Michaelyn Barter, MD on 01/20/2024 Stage prefix: Initial diagnosis Nuclear grade: G3 Multigene prognostic tests performed: Oncotype DX   ASSESSMENT & PLAN:  Angelica Dougherty 53 y.o. 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  # Left breast IDC, pathologic Stage IA, pT, ER/PR positive, HER2 negative -Full imaging and pathology report below.  -s/p bilateral mastectomies with immediate reconstruction by Dr. Maurine Minister and Dr. Ulice Bold on 12/23/2023.  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  6 lymph nodes removed.  1 lymph node positive for individual tumor cells.  Tumor size 16 mm, overall grade 3, anterior superior margins involved, ER 90% positive, PR 90% positive, HER2 1+ IHC negative.  pT1 pN0  (I+)  - Oncotype DX score 13.  No role for adjuvant chemo.    -Case was discussed with Dr. Maurine Minister.  At the time of the resection, markers also came out which left the positive margin and it will be difficult to do reexcision.  In setting of positive margins, she is planned for postmastectomy radiation on the left side.  Completion date on May 29.  -History of hysterectomy in 2007.  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  Left ovarian cyst however it was not removed. Labs from February 2025 showed estradiol 115.  LH 9.7 and FSH 13.6.  Hormone levels fall in perimenopausal range.  Not quite in postmenopausal.  Will repeat levels in June when she will be done with radiation and ready to start endocrine treatment.  If levels are postmenopausal, we discussed about starting letrozole 2.5 mg once daily.  Side effects such as hot flashes, arthralgia, myalgia, osteoporosis, increased cholesterol level was discussed.  Scheduled for DEXA scan for baseline bone health.  If hormone levels are still perimenopausal range, may need to consider tamoxifen.  Side effects such as hot flashes, increased risk of thromboembolic event and risk of uterine cancer was discussed.  But she has history of hysterectomy in 2007.  Patient is not too keen on tamoxifen due to side effect profile.  Reassess in 2 months after completion of radiation treatment.  - Tissue expanders were removed and has breast implants placed.  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  Tumor size 12 mm overall grade 3.  Margins negative.  0/3 lymph node involved.  ER 95% positive, PR 95%, HER2 IHC 2+ by FISH.  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  - Oncotype DX score 25.  With age more than 78, no benefit from adjuvant chemotherapy. - As above  # Family history of breast cancer -[employer] gene panel testing done in 2024 was  negative  RTC in 2 months for MD visit, labs 1 week prior (to obtain hormone levels), discuss endocrine therapy.  Orders Placed This Encounter  Procedures   DG Bone Density    Standing Status:   Future    Expected Date:   04/22/2024    Expiration Date:   03/23/2025    Reason for Exam (SYMPTOM  OR DIAGNOSIS REQUIRED):   baseline bone density    Is patient pregnant?:   No    Preferred imaging location?:   [facility]   CBC with Differential (Cancer Center Only)    Standing Status:   Future    Expected Date:   05/23/2024    Expiration Date:   03/23/2025   CMP (Cancer Center only)    Standing Status:   Future    Expected Date:   05/23/2024    Expiration Date:   03/23/2025   FSH/LH    Standing Status:   Future    Expected Date:   05/23/2024    Expiration Date:   03/23/2025   Estradiol    Standing Status:   Future    Expected Date:   05/23/2024    Expiration Date:   03/23/2025     The total time spent in the appointment was 30 minutes encounter with patients including review of chart and various tests results, discussions about plan of care and coordination of care plan   All questions were answered. The patient knows to call the clinic with any problems, questions or concerns. No barriers to learning was detected.  Loreatha Rodney, MD 4/15/20253:04 PM   HISTORY OF PRESENTING ILLNESS:  Angelica Dougherty 53 y.o. 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  Interval history Patient was seen today as follow-up for bilateral breast cancer. Only had tissue expanders removed.  Bilateral breast implants were placed.  She is doing well overall. denies pain.  Plan to start radiation in couple of weeks for positive margins on the left side.    I have reviewed her chart and materials related to her cancer extensively and collaborated history with the patient. Summary of oncologic history is as follows: Oncology  History  Breast cancer, left (HCC)  08/26/2023 Mammogram   Screening mammogram IMPRESSION: Further evaluation is suggested for possible calcifications in the right breast.   Further evaluation is suggested for possible mass in the left breast.  Diagnostic mammogram and ultrasound FINDINGS: RIGHT breast: In the upper central right breast at middle depth, there are amorphous and round calcifications in a linear distribution spanning 1.9 cm. These are new compared to most recent mammogram 12/31/2017. No suspicious mass or other findings in the right breast.   LEFT breast: The previously noted possible mass seen in the upper posterior left breast on MLO view only, overlying the pectoralis muscle, persists on additional views as a 1.8 cm irregular mass with indistinct margins in the associated architectural distortion. On tomosynthesis series, the mass localizes to the medial breast. No definite mammographic correlate seen on [facility] M view, likely due to the far posterior location. No suspicious calcifications or other suspicious  findings in the left breast.   Targeted ultrasound of the left breast at the 11:30 position 18 cm from the nipple demonstrates a 2.1 x 1.2 x 1.2 cm irregular hypoechoic mass with indistinct and angular margins, corresponding to the mammographic finding.   Targeted ultrasound of the left axilla demonstrates lymph nodes with normal morphology.   IMPRESSION: 1. Highly suspicious 2.1 cm mass at the left breast 11:30 position. 2. Suspicious linear calcifications in the upper central right breast. 3. No left axillary lymphadenopathy.     09/25/2023 Pathology Results   FINAL DIAGNOSIS       1. Breast, right, needle core biopsy, upper middle depth, ribbon clip :      - DUCTAL CARCINOMA IN SITU, INTERMEDIATE GRADE (2)      - CANNOT RULE OUT FOCAL MICROINVASION      - NECROSIS: PRESENT, COMEDO-TYPE      - CALCIFICATIONS: PRESENT      - DCIS LENGTH: 0.22 CM       - SEE NOTE       2. 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      - TUBULE FORMATION: SCORE 3      - NUCLEAR PLEOMORPHISM: SCORE 3      - MITOTIC COUNT: SCORE 2      - TOTAL SCORE: 8      - OVERALL GRADE: 3      - LYMPHOVASCULAR INVASION: NOT IDENTIFIED      - CANCER LENGTH: 1.1 CM      - CALCIFICATIONS: NOT IDENTIFIED      - SEE NOTE  Results: IMMUNOHISTOCHEMICAL AND MORPHOMETRIC ANALYSIS PERFORMED MANUALLY Estrogen Receptor:  95%, POSITIVE, STRONG STAINING INTENSITY REFERENCE RANGE ESTROGEN RECEPTOR NEGATIVE     0% POSITIVE       =>1% All controls stained appropriately [employer], [place], Sports administrator, International aid/development worker ( Signed 10 21 2024) Breast, left, needle core biopsy, 11:30 o'clock, 18cmfn savi scout PROGNOSTIC INDICATORS  Results: IMMUNOHISTOCHEMICAL AND MORPHOMETRIC ANALYSIS PERFORMED MANUALLY The tumor cells are negative for Her2 (1+). Estrogen Receptor:  90%, POSITIVE, STRONG STAINING INTENSITY Progesterone Receptor:  90%, POSITIVE, STRONG STAINING INTENSITY REFERENCE RANGE ESTROGEN RECEPTOR NEGATIVE     0% POSITIVE       =>1% REFERENCE RANGE PROGESTERONE RECEPTOR NEGATIVE     0% POSITIVE        =>1%    09/30/2023 Cancer Staging   Staging form: Breast, AJCC 8th Edition - Clinical: Stage IIA (cT2, cN0, cM0, G3, ER+, PR+, HER2-) - Signed by Michaelyn Barter, MD on 09/30/2023 Histologic grading system: 3 grade system    Genetic Testing   Negative genetic testing on the [facility] BRCAP[facility]+CancerNext-Expanded+RNA panel. The final report date is 10/27/2023.  The CancerNext-Expanded gene panel offered by [facility] and includes sequencing, rearrangement, and RNA analysis for the following 71 genes: AIP, ALK, APC, ATM, BAP1, BARD1, BMPR1A, BRCA1, BRCA2, BRIP1, CDC73, CDH1, CDK4, CDKN1B, CDKN2A, CHEK2, DICER1, FH, FLCN, KIF1B, LZTR1, MAX, MEN1, MET, MLH1, MSH2, MSH6, MUTYH,  NF1, NF2, NTHL1, PALB2,  PHOX2B, PMS2, POT1, PRKAR1A, PTCH1, PTEN, RAD51C, RAD51D, RB1, RET, SDHA, SDHAF2, SDHB, SDHC, SDHD, SMAD4, SMARCA4, SMARCB1, SMARCE1, STK11, SUFU, TMEM127, TP53, TSC1, TSC2 and VHL (sequencing and deletion/duplication); AXIN2, CTNNA1, EGFR, EGLN1, HOXB13, KIT, MITF, MSH3, PDGFRA, POLD1 and POLE (sequencing only); EPCAM and GREM1 (deletion/duplication only).   10/08/2023 Breast MRI   MRI bilateral breast-  IMPRESSION: 1. Biopsy-proven  DCIS involving the UPPER INNER QUADRANT of the RIGHT breast at middle depth and biopsy-proven IDC with lobular features involving the UPPER INNER QUADRANT of the LEFT breast at posterior depth. 2. Suspicious 1.1 cm mass in the upper RIGHT breast at posterior depth, directly posterior to the biopsy-proven DCIS. 3. Indeterminate 2.0 cm non-mass enhancement involving the LOWER OUTER QUADRANT of the RIGHT breast at anterior depth. 4. Indeterminate 1.2 cm mass in the lower LEFT breast at posterior depth, adjacent to the chest wall. 5. 2 foci of indeterminate linear non-mass enhancement in the LEFT breast; 1.9 cm NME in the LOWER INNER QUADRANT middle depth and 2.2 cm NME in the lower breast at middle depth. 6. Solitary suspicious enhancing lesion involving the lower sternum. 7. Benign enhancing mass in the outer LEFT breast at the near 3 o'clock location (stable over prior mammograms dating back to 2019).   RECOMMENDATION: 1. MRI-directed second-look ultrasound of both breasts to see if the masses described above can be identified and possibly biopsied if suspicious in appearance. 2. MRI guided core needle biopsy of the indeterminate non-mass enhancement involving the LOWER OUTER QUADRANT of the RIGHT breast and the 2 foci of non-mass enhancement in the LOWER INNER QUADRANT of the LEFT breast and the lower LEFT breast.     12/22/2023 Cancer Staging   Staging form: Breast, AJCC 8th Edition - Pathologic stage from 12/22/2023: Stage IA  (pT1c, pN0(i+)(sn), cM0, G3, ER+, PR+, HER2-) - Signed by Xylon Croom, MD on 01/20/2024 Stage prefix: Initial diagnosis Method of lymph node assessment: Sentinel lymph node biopsy Multigene prognostic tests performed: Oncotype DX Histologic grading system: 3 grade system    Pathology Results            12/23/2023 Definitive Surgery   S/p bilateral mastectomies with immediate reconstruction with Dr. Cornel Diesel and Dr. Orin Birk    Pathology Results   FINAL DIAGNOSIS       1. Breast, simple mastectomy, left :      - INVASIVE DUCTAL CARCINOMA WITH FOCAL LOBULAR FEATURES      - SEE ONCOLOGY TABLE AND NOTE       2. Lymph node, sentinel, biopsy, #2 right :      - ONE LYMPH NODE, NEGATIVE FOR METASTATIC CARCINOMA (0/1)      - SEE ONCOLOGY TABLE AND NOTE       3. Lymph node, sentinel, biopsy, #3 right :      - ONE LYMPH NODE, NEGATIVE FOR METASTATIC CARCINOMA (0/1)      - SEE ONCOLOGY TABLE AND NOTE       4. Lymph node, sentinel, biopsy, #1left :      - FOCAL BREAST PARENCHYMA WITH MARKED THERMAL ARTIFACT      - BENIGN FIBROADIPOSE TISSUE AND FOCAL MUSCLE      - LYMPHOID TISSUE IS NOT IDENTIFIED      - NEGATIVE FOR DEFINITIVE MALIGNANCY      - SEE ONCOLOGY TABLE AND NOTE       5. Lymph node, sentinel, biopsy, #2 left :      - FOCAL BREAST PARENCHYMA WITH SECRETORY CHANGE, ADENOSIS, FIBROCYSTIC CHANGE      AND THERMAL ARTIFACT      - BENIGN FIBROADIPOSE TISSUE AND SCANT MUSCLE      - LYMPHOID TISSUE IS NOT IDENTIFIED      - NEGATIVE FOR MALIGNANCY      - SEE NOTE AND ONCOLOGY TABLE       6. Lymph node, sentinel, biopsy, #3  left :      - BREAST PARENCHYMA WITH FIBROCYSTIC CHANGES SECRETORY CHANGE, ADENOSIS AND      MARKED THERMAL ARTIFACT      - BENIGN FIBROADIPOSE TISSUE      - LYMPHOID TISSUE IS NOT IDENTIFIED      - NEGATIVE FOR DEFINITIVE MALIGNANCY      - SEE ONCOLOGY TABLE AND NOTE       7. Lymph node, sentinel, biopsy, #4 left :      - FOCAL BREAST PARENCHYMA WITH  SECRETORY CHANGE, ADENOSIS  AND THERMAL ARTIFACT      - BENIGN FIBROADIPOSE TISSUE      - LYMPHOID TISSUE IS NOT IDENTIFIED      - NEGATIVE FOR MALIGNANCY      - SEE NOTE AND ONCOLOGY TABLE       8. Lymph node, sentinel, biopsy, #5 left :      - FOCAL BREAST PARENCHYMA WITH SECRETORY CHANGE AND THERMAL ARTIFACT      - BENIGN FIBROADIPOSE TISSUE AND SCANT MUSCLE      - LYMPHOID TISSUE IS NOT IDENTIFIED      - NEGATIVE FOR MALIGNANCY      - SEE NOTE AND ONCOLOGY TABLE       9. Lymph node, sentinel, biopsy, #6 left :      - ONE LYMPH NODE POSITIVE FOR INDIVIDUAL TUMOR CELLS (0/1)      - SEE ONCOLOGY TABLE AND NOTE       10. Breast, simple mastectomy, Right :      - MULTIFOCAL, INVASIVE CARCINOMA WITH MIXED DUCTAL AND LOBULAR FEATURES      - INVASIVE DUCTAL CARCINOMA AND DUCTAL CARCINOMA IN SITU (DCIS)      - SEE ONCOLOGY TABLE AND NOTE       11. Lymph node, sentinel, biopsy, #1right :      - ONE LYMPH NODE, NEGATIVE FOR METASTATIC CARCINOMA (0/1)      - SEE ONCOLOGY TABLE AND NOTE       Diagnosis Note : - 7.      INVASIVE CARCINOMA OF THE BREAST:  Resection      Procedure: Mastectomy      Specimen Laterality: Left      Histologic Type: Invasive ductal carcinoma with focal lobular features      Histologic Grade:      Glandular (Acinar)/Tubular Differentiation: 3      Nuclear Pleomorphism: 3      Mitotic Rate: 2      Overall Grade: 3      Tumor Size: 16 mm      Ductal Carcinoma In Situ: Not identified      Tumor Extent: N/A      Lymphatic and/or Vascular Invasion: Not identified      Treatment Effect in the Breast: No known presurgical therapy      Margins:      Distance from Closest Margin (mm): Anterior/Superior (Involved); Posterior (< 1      mm)      Specify Closest Margin (required only if <72mm): see above      Regional Lymph Nodes:      Number of Lymph Nodes Examined: 1      Number of Sentinel Nodes Examined: 1      Number of Lymph Nodes with Macrometastases (>2 mm):  0      Number of Lymph Nodes with Micrometastases: 0      Number of Lymph Nodes with Isolated Tumor Cells (=0.2 mm or =  200 cells): 1      Size of Largest Metastatic Deposit (mm): Less than or equal to 0.2 mm, 200 cells      or less      Extranodal Extension: N/A      Distant Metastasis:   Estrogen Receptor: 90%, positive, strong staining intensity       Progesterone Receptor: 90%, positive, strong staining intensity       HER2: Negative (1+)       Ki-67: Not performed       Pathologic Stage Classification (pTNM, AJCC 8th Edition): pT1c, pN0 (i+) (sn)   Specimen Laterality: Right      Histologic Type: Invasive carcinoma with mixed ductal and lobular features (see      comment below)      Histologic Grade:      Glandular (Acinar)/Tubular Differentiation: 3      Nuclear Pleomorphism: 3      Mitotic Rate: 2      Overall Grade: 3      Tumor Size: 12 mm      Ductal Carcinoma In Situ: Present (ribbon clip site, see comment below)      Tumor Extent: N/A      Lymphatic and/or Vascular Invasion: Not identified      Treatment Effect in the Breast: No known presurgical therapy      Margins: All margins negative for invasive carcinoma or carcinoma in-situ      Distance from Closest Margin (mm): 3 mm      Specify Closest Margin (required only if  <65mm): Anterior/Inferior      DCIS Margins: Uninvolved by DCIS      Distance from Closest Margin (mm): 9 mm      Specify Closest Margin (required only if <21mm): Anterior/superior      Regional Lymph Nodes:      Number of Lymph Nodes Examined: 3      Number of Sentinel Nodes Examined: 3      Number of Lymph Nodes with Macrometastases (>2 mm): 0      Number of Lymph Nodes with Micrometastases: 0      Number of Lymph Nodes with Isolated Tumor Cells (=0.2 mm or =200 cells): 0      Size of Largest Metastatic Deposit (mm): N/A      Extranodal Extension: N/A      Distant Metastasis:      Distant Site(s) Involved: N/A      Breast Biomarker Testing  Performed on Previous Biopsy:      Testing Performed on Case Number: [id number] (part 1, ribbon clip) for DCIS      Estrogen Receptor: 95%, positive, strong staining intensity      Progesterone Receptor: Not performed      HER2: Not performed      Ki-67: Not performed      Testing Performed on Case Number: [id number] (part 1, barbell clip)      Estrogen Receptor: 95%, positive, strong staining intensity      Progesterone Receptor: 95%, positive, strong staining intensity      HER2: FISH (negative), equivocal by IHC (2+)      Ki-67: Not performed      Pathologic Stage Classification (pTNM, AJCC 8th Edition): pT1c, pN0 (sn)      Representative Tumor Block:  8D, 8K      Comment(s): Biopsy site changes are present in both clip sites.  Multifocal      invasive carcinoma is present:      Lesion A (  Ribbon Clip): Invasive ductal carcinoma, grade 3 (3, 3, 2); ductal      carcinoma in situ (DCIS), cribriform and solid types with focal necrosis, grade      2      Size of invasive component: 8 mm (2 blocks, 4 mm each)      Margins: All margins negative for invasive carcinoma and carcinoma in situ      Closest margin: Anterior/superior is 9.0 mm from DCIS      Lesion B (Barbell Clip): Invasive carcinoma with mixed ductal and lobular      features, grade 3      Atypical lobular hyperplasia (ALH)      Size of invasive component: 12 mm (3 blocks, 4 mm each)      Margins: All margins negative for invasive carcinoma      Closest margin: Anterior/Inferior is 3.0 mm from invasive carcinoma      Lesion C (no clip identified): Invasive carcinoma with mixed ductal and lobular      features, grade 3      Atypical lobular hyperplasia (ALH)      Size of invasive component: 12 mm (3 blocks, 4 mm each)      Margins: All margins negative for invasive carcinoma      Breast cancer, right (HCC)  12/22/2023 Cancer Staging   Staging form: Breast, AJCC 8th Edition - Pathologic stage from 12/22/2023: pT1c, pN0, cM0,  ER+, PR+, HER2- - Signed by Michaelyn Barter, MD on 01/20/2024 Stage prefix: Initial diagnosis Nuclear grade: G3 Multigene prognostic tests performed: Oncotype DX   01/20/2024 Initial Diagnosis   Breast cancer, right [facility])     MEDICAL HISTORY:  Past Medical History:  Diagnosis Date   Allergy    Ductal carcinoma in situ (DCIS) of right breast 09/2023   Fibroid uterus    Hyperlipidemia    Malignant neoplasm of left breast in female, estrogen receptor positive (HCC) 09/2023   Malignant neoplasm of right breast in female, estrogen receptor positive (HCC) 09/2023   PONV (postoperative nausea and vomiting)     SURGICAL HISTORY: Past Surgical History:  Procedure Laterality Date   ABDOMINAL HYSTERECTOMY  06/2006   Total hysterctomy excessive bleeding and fibroids per patient report. [facility]   BREAST BIOPSY Right 09/25/2023   stereo bx, calcs, RIBBON clip-path pending   BREAST BIOPSY Left 09/25/2023   [place] bx, savi tag as marker, path pending   BREAST BIOPSY Right 09/25/2023   MM RT BREAST BX W LOC DEV 1ST LESION IMAGE BX SPEC STEREO GUIDE 09/25/2023 [facility]   BREAST BIOPSY Left 09/25/2023   [place] LT BREAST BX W LOC DEV 1ST LESION IMG BX SPEC US GUIDE 09/25/2023 [facility]   BREAST EXCISIONAL BIOPSY Right 1993   benign   BREAST LUMPECTOMY Right 1991   benign   BREAST RECONSTRUCTION WITH PLACEMENT OF TISSUE EXPANDER AND FLEX HD (ACELLULAR HYDRATED DERMIS) Bilateral 12/22/2023   Procedure: BREAST RECONSTRUCTION WITH PLACEMENT OF TISSUE EXPANDER AND FLEX HD (ACELLULAR HYDRATED DERMIS);  Surgeon: Peggye Form, DO;  Location: [facility];  Service: Plastics;  Laterality: Bilateral;   CESAREAN SECTION  12/16/2005   MASTECTOMY W/ SENTINEL NODE BIOPSY Bilateral 12/22/2023   Procedure: MASTECTOMY WITH SENTINEL LYMPH NODE BIOPSY, simple mastectomy;  Surgeon: Kandis Cocking, MD;  Location: [facility];  Service: General;  Laterality: Bilateral;   REMOVAL OF TISSUE EXPANDER AND  PLACEMENT OF IMPLANT Bilateral 02/18/2024   Procedure: REMOVAL, TISSUE EXPANDER, BREAST, WITH IMPLANT INSERTION;  Surgeon: Peggye Form,  DO;  Location: [facility];  Service: Plastics;  Laterality: Bilateral;    SOCIAL HISTORY: Social History   Socioeconomic History   Marital status: Single    Spouse name: Not on file   Number of children: 1   Years of education: Not on file   Highest education level: Some college, no degree  Occupational History   Not on file  Tobacco Use   Smoking status: Never    Passive exposure: Never   Smokeless tobacco: Never  Vaping Use   Vaping status: Never Used  Substance and Sexual Activity   Alcohol use: No   Drug use: No   Sexual activity: Not Currently  Other Topics Concern   Not on file  Social History Narrative   Not on file   Social Drivers of Health   Financial Resource Strain: Low Risk  (08/15/2023)   Overall Financial Resource Strain (CARDIA)    Difficulty of Paying Living Expenses: Not very hard  Food Insecurity: No Food Insecurity (12/22/2023)   Hunger Vital Sign    Worried About Running Out of Food in the Last Year: Never true    Ran Out of Food in the Last Year: Never true  Transportation Needs: No Transportation Needs (12/22/2023)   PRAPARE - Administrator, Civil Service (Medical): No    Lack of Transportation (Non-Medical): No  Physical Activity: Insufficiently Active (08/15/2023)   Exercise Vital Sign    Days of Exercise per Week: 2 days    Minutes of Exercise per Session: 20 min  Stress: No Stress Concern Present (08/15/2023)   [employer] of Occupational Health - Occupational Stress Questionnaire    Feeling of Stress : Not at all  Social Connections: Moderately Integrated (08/15/2023)   Social Connection and Isolation Panel [NHANES]    Frequency of Communication with Friends and Family: More than three times a week    Frequency of Social Gatherings with Friends and Family: Once a week     Attends Religious Services: More than 4 times per year    Active Member of [employer] or Organizations: Yes    Attends Engineer, structural: More than 4 times per year    Marital Status: Never married  Intimate Partner Violence: Not At Risk (12/22/2023)   Humiliation, Afraid, Rape, and Kick questionnaire    Fear of Current or Ex-Partner: No    Emotionally Abused: No    Physically Abused: No    Sexually Abused: No    FAMILY HISTORY: Family History  Problem Relation Age of Onset   Asthma Mother    Cancer Maternal Uncle        back cancer at 39   Breast cancer Paternal Aunt        dx 30s, bilateral   Breast cancer Paternal Aunt        dx 47s   Breast cancer Cousin        dx 18s    ALLERGIES:  is allergic to codeine.  MEDICATIONS:  Current Outpatient Medications  Medication Sig Dispense Refill   acetaminophen (TYLENOL) 500 MG tablet Take 500 mg by mouth every 6 (six) hours as needed.     diphenhydrAMINE (BENADRYL) 25 MG tablet Take 25 mg by mouth daily as needed for allergies.     ondansetron (ZOFRAN) 4 MG tablet Take 1 tablet (4 mg total) by mouth every 8 (eight) hours as needed for nausea or vomiting. (Patient not taking: Reported on 03/23/2024) 20 tablet 0  ondansetron (ZOFRAN) 4 MG tablet Take 1 tablet (4 mg total) by mouth every 8 (eight) hours as needed for nausea or vomiting. (Patient not taking: Reported on 03/23/2024) 20 tablet 0   No current facility-administered medications for this visit.    REVIEW OF SYSTEMS:   Pertinent information mentioned in HPI All other systems were reviewed with the patient and are negative.  PHYSICAL EXAMINATION: ECOG PERFORMANCE STATUS: 0 - Asymptomatic  Vitals:   03/23/24 1330  BP: 116/71  Pulse: 62  Resp: 18  Temp: 97.9 F (36.6 C)  SpO2: 100%   Filed Weights   03/23/24 1330  Weight: 185 lb (83.9 kg)    GENERAL:alert, no distress and comfortable SKIN: skin color, texture, turgor are normal, no rashes or significant  lesions EYES: normal, conjunctiva are pink and non-injected, sclera clear OROPHARYNX:no exudate, no erythema and lips, buccal mucosa, and tongue normal  NECK: supple, thyroid normal size, non-tender, without nodularity LYMPH:  no palpable lymphadenopathy in the cervical, axillary or inguinal LUNGS: clear to auscultation and percussion with normal breathing effort HEART: regular rate & rhythm and no murmurs and no lower extremity edema ABDOMEN:abdomen soft, non-tender and normal bowel sounds Musculoskeletal:no cyanosis of digits and no clubbing  PSYCH: alert & oriented x 3 with fluent speech NEURO: no focal motor/sensory deficits  LABORATORY DATA:  I have reviewed the data as listed Lab Results  Component Value Date   WBC 6.5 01/20/2024   HGB 12.6 01/20/2024   HCT 37.7 01/20/2024   MCV 93.5 01/20/2024   PLT 310 01/20/2024   Recent Labs    08/15/23 1030 01/20/24 1535  NA 138 138  K 4.6 4.3  CL 102 101  CO2 24 27  GLUCOSE 92 85  BUN 8 10  CREATININE 0.99 0.64  CALCIUM 9.9 9.5  GFRNONAA  --  >60  PROT 7.1 7.9  ALBUMIN 4.6 4.6  AST 22 20  ALT 18 18  ALKPHOS 61 49  BILITOT 0.6 1.1    RADIOGRAPHIC STUDIES: I have personally reviewed the radiological images as listed and agreed with the findings in the report. No results found.

## 2024-03-23 NOTE — Progress Notes (Signed)
 Patient is doing good no new questions or concerns for the doctor today. She is just ready to get started with the radiation treatment, which is Thursday.

## 2024-03-24 ENCOUNTER — Other Ambulatory Visit: Payer: Self-pay | Admitting: *Deleted

## 2024-03-24 DIAGNOSIS — C50412 Malignant neoplasm of upper-outer quadrant of left female breast: Secondary | ICD-10-CM

## 2024-03-25 ENCOUNTER — Ambulatory Visit
Admission: RE | Admit: 2024-03-25 | Discharge: 2024-03-25 | Disposition: A | Discharge status: Home / Self Care | Source: Ambulatory Visit | Attending: Radiation Oncology | Admitting: Radiation Oncology

## 2024-03-25 ENCOUNTER — Ambulatory Visit: Admission: RE | Admit: 2024-03-25 | Source: Ambulatory Visit

## 2024-03-25 DIAGNOSIS — C50412 Malignant neoplasm of upper-outer quadrant of left female breast: Secondary | ICD-10-CM | POA: Diagnosis not present

## 2024-03-25 DIAGNOSIS — Z170 Estrogen receptor positive status [ER+]: Secondary | ICD-10-CM | POA: Diagnosis not present

## 2024-03-26 ENCOUNTER — Encounter: Payer: BC Managed Care – PPO | Admitting: Surgical

## 2024-03-29 ENCOUNTER — Ambulatory Visit
Admission: RE | Admit: 2024-03-29 | Discharge: 2024-03-29 | Disposition: A | Discharge status: Home / Self Care | Source: Ambulatory Visit | Attending: Radiation Oncology | Admitting: Radiation Oncology

## 2024-03-29 ENCOUNTER — Other Ambulatory Visit: Payer: Self-pay

## 2024-03-29 DIAGNOSIS — Z510 Encounter for antineoplastic radiation therapy: Secondary | ICD-10-CM | POA: Diagnosis not present

## 2024-03-29 DIAGNOSIS — Z170 Estrogen receptor positive status [ER+]: Secondary | ICD-10-CM | POA: Diagnosis not present

## 2024-03-29 DIAGNOSIS — C50412 Malignant neoplasm of upper-outer quadrant of left female breast: Secondary | ICD-10-CM | POA: Diagnosis not present

## 2024-03-29 LAB — RAD ONC ARIA SESSION SUMMARY
Course Elapsed Days: 0
Plan Fractions Treated to Date: 1
Plan Prescribed Dose Per Fraction: 1.8 Gy
Plan Total Fractions Prescribed: 28
Plan Total Prescribed Dose: 50.4 Gy
Reference Point Dosage Given to Date: 1.8 Gy
Reference Point Session Dosage Given: 1.8 Gy
Session Number: 1

## 2024-03-30 ENCOUNTER — Other Ambulatory Visit: Payer: Self-pay

## 2024-03-30 ENCOUNTER — Encounter: Payer: Self-pay | Admitting: *Deleted

## 2024-03-30 ENCOUNTER — Ambulatory Visit
Admission: RE | Admit: 2024-03-30 | Discharge: 2024-03-30 | Disposition: A | Discharge status: Home / Self Care | Source: Ambulatory Visit | Attending: Radiation Oncology | Admitting: Radiation Oncology

## 2024-03-30 DIAGNOSIS — Z510 Encounter for antineoplastic radiation therapy: Secondary | ICD-10-CM | POA: Diagnosis not present

## 2024-03-30 DIAGNOSIS — Z170 Estrogen receptor positive status [ER+]: Secondary | ICD-10-CM | POA: Diagnosis not present

## 2024-03-30 DIAGNOSIS — C50412 Malignant neoplasm of upper-outer quadrant of left female breast: Secondary | ICD-10-CM | POA: Diagnosis not present

## 2024-03-30 LAB — RAD ONC ARIA SESSION SUMMARY
Course Elapsed Days: 1
Plan Fractions Treated to Date: 2
Plan Prescribed Dose Per Fraction: 1.8 Gy
Plan Total Fractions Prescribed: 28
Plan Total Prescribed Dose: 50.4 Gy
Reference Point Dosage Given to Date: 3.6 Gy
Reference Point Session Dosage Given: 1.8 Gy
Session Number: 2

## 2024-03-31 ENCOUNTER — Ambulatory Visit: Admitting: Occupational Therapy

## 2024-03-31 ENCOUNTER — Ambulatory Visit
Admission: RE | Admit: 2024-03-31 | Discharge: 2024-03-31 | Disposition: A | Discharge status: Home / Self Care | Source: Ambulatory Visit | Attending: Radiation Oncology | Admitting: Radiation Oncology

## 2024-03-31 ENCOUNTER — Telehealth: Payer: Self-pay | Admitting: Plastic Surgery

## 2024-03-31 ENCOUNTER — Other Ambulatory Visit: Payer: Self-pay

## 2024-03-31 DIAGNOSIS — Z510 Encounter for antineoplastic radiation therapy: Secondary | ICD-10-CM | POA: Diagnosis not present

## 2024-03-31 DIAGNOSIS — C50412 Malignant neoplasm of upper-outer quadrant of left female breast: Secondary | ICD-10-CM | POA: Diagnosis not present

## 2024-03-31 DIAGNOSIS — Z170 Estrogen receptor positive status [ER+]: Secondary | ICD-10-CM | POA: Diagnosis not present

## 2024-03-31 DIAGNOSIS — R293 Abnormal posture: Secondary | ICD-10-CM

## 2024-03-31 DIAGNOSIS — M25611 Stiffness of right shoulder, not elsewhere classified: Secondary | ICD-10-CM

## 2024-03-31 LAB — RAD ONC ARIA SESSION SUMMARY
Course Elapsed Days: 2
Plan Fractions Treated to Date: 3
Plan Prescribed Dose Per Fraction: 1.8 Gy
Plan Total Fractions Prescribed: 28
Plan Total Prescribed Dose: 50.4 Gy
Reference Point Dosage Given to Date: 5.4 Gy
Reference Point Session Dosage Given: 1.8 Gy
Session Number: 3

## 2024-03-31 NOTE — Therapy (Signed)
 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 [facility]   BREAST BIOPSY Right 09/25/2023   stereo bx, calcs, RIBBON clip-path pending   BREAST BIOPSY Left 09/25/2023   US  bx, savi tag as marker, path pending   BREAST BIOPSY Right 09/25/2023   MM RT BREAST BX W LOC DEV 1ST LESION IMAGE BX SPEC STEREO GUIDE 09/25/2023 [facility]   BREAST BIOPSY Left 09/25/2023   US  LT BREAST BX W LOC DEV 1ST LESION IMG BX SPEC US  GUIDE 09/25/2023 [facility]   BREAST EXCISIONAL BIOPSY Right 1993   benign   BREAST LUMPECTOMY Right 1991   benign   BREAST RECONSTRUCTION WITH PLACEMENT OF TISSUE EXPANDER AND FLEX HD (ACELLULAR HYDRATED DERMIS) Bilateral 12/22/2023   Procedure: BREAST RECONSTRUCTION WITH PLACEMENT OF TISSUE EXPANDER AND FLEX HD (ACELLULAR HYDRATED DERMIS);  Surgeon: Thornell Flirt, DO;  Location: [facility];  Service: Plastics;  Laterality: Bilateral;   CESAREAN SECTION  12/16/2005    MASTECTOMY W/ SENTINEL NODE BIOPSY Bilateral 12/22/2023   Procedure: MASTECTOMY WITH SENTINEL LYMPH NODE BIOPSY, simple mastectomy;  Surgeon: Barrett Lick, MD;  Location: [facility];  Service: General;  Laterality: Bilateral;   REMOVAL OF TISSUE EXPANDER AND PLACEMENT OF IMPLANT Bilateral 02/18/2024   Procedure: REMOVAL, TISSUE EXPANDER, BREAST, WITH IMPLANT INSERTION;  Surgeon: Thornell Flirt, DO;  Location: [facility];  Service: Plastics;  Laterality: Bilateral;   Patient Active Problem List   Diagnosis Date Noted   Breast cancer, right (HCC) 01/20/2024   S/P bilateral mastectomy 12/22/2023   Genetic testing 10/28/2023   Breast cancer, left (HCC) 09/30/2023   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   Annual physical exam 08/15/2023   Screening mammogram for breast cancer 08/15/2023   Screen for colon cancer 08/15/2023   Post-menopausal 08/15/2023   HLD (hyperlipidemia) 08/03/2007    PCP: Marlys Singh NP  REFERRING PROVIDER: DR Tami Falcon DIAG: Breast CA with bil lumpectomy  THERAPY DIAG:  Abnormal posture  Shoulder joint stiffness, bilateral  Rationale for Evaluation and Treatment: Rehabilitation  ONSET DATE: 09/30/23  SUBJECTIVE:  SUBJECTIVE STATEMENT: My daughter is going to college in [place] it looks like.  I am going to miss her.  I was also supposed to go back to work on Monday.  But HR told me that my job is not there anymore.  So now I have to go on the floor and do physical lifting and pushing and pulling.  So I reached out to my surgeon.  My short-term disability is asking all my paperwork.  Radiation is doing okay.  Still tight in the shoulder  PERTINENT HISTORY:  Patient was diagnosed with bilateral  breast cancer -patient had bilateral mastectomy with same  time reconstruction with expanders by Dr. Dillingham-12/22/2023 surgery was done.  With the last visit no expansion was done.  3 lymph nodes removed on the right and 6 lymph nodes removed from the left.  Exchange of expanders for silicone implants was done on 02/18/2024 by Dr. Orin Birk.  Radiation starting next week. PATIENT GOALS:   reduce lymphedema risk and learn post op HEP.   PAIN:  Are you having pain? No  PRECAUTIONS: Active CA     HAND DOMINANCE: left  WEIGHT BEARING RESTRICTIONS: No  FALLS:  Has patient fallen in last 6 months? 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   LEISURE: read , spend time with daughter    OBJECTIVE:  COGNITION: Overall cognitive status: Within functional limits for tasks assessed    POSTURE:  Forward head and rounded shoulders posture  UPPER EXTREMITY AROM/PROM:  A/PROM RIGHT   eval  R 01/21/24 R 01/23/24 R 01/26/24 R  02/09/24 R 03/10/24 R 03/17/24 R 03/31/24  Shoulder extension          Shoulder flexion 165 112 112 125 125 In session 150 115 160 145  Shoulder abduction 165 95 90 95 92 ( in session 109 105 135 140 end of session 145  Shoulder internal rotation          Shoulder external rotation 85 80 85 85 84 Within functional limits behind head 90 behind head 90    (Blank rows = not tested)  A/PROM LEFT   eval L 01/21/24 L 01/23/24 L 01/26/24 L 02/09/24 L 03/10/24 L 03/17/24 L 03/31/24  Shoulder extension          Shoulder flexion 165 108 90( end of session 100) 102 100 in session 115 102 125 125 end of session 135  Shoulder abduction 165 90 70( end of session 90) 85 82 in session 94 105 110 110 end of session 120  Shoulder internal rotation          Shoulder external rotation 85 75 80 80 72 70 70 80    (Blank rows = not tested)  CERVICAL AROM: All within normal limits:     UPPER EXTREMITY STRENGTH: Not tested patient is about 4 weeks  postop  LYMPHEDEMA ASSESSMENTS:   LANDMARK RIGHT   01/21/24 R  02/08/26  10 cm proximal to olecranon process 28.2 28.2  Olecranon process 26.5  26.5  10 cm proximal to ulnar styloid process 22.1 at 15 cm    Just proximal to ulnar styloid process    Across hand at thumb web space    At base of 2nd digit    (Blank rows = not tested)  LANDMARK LEFT   01/21/24 L 02/09/24  10 cm proximal to olecranon process 28.5 28.7.  Olecranon process  26.5 26.6  10 cm proximal to ulnar styloid process 22.4 at 15 cm    Just proximal to ulnar styloid process    Across hand at thumb web space    At base of 2nd digit    (Blank rows = not tested)  L-DEX LYMPHEDEMA SCREENING:  SOZO NOT TO BE DONE - NOTE DONE - Pt ended up had lymph node removed on both sides.  The patient was assessed using the L-Dex machine today to produce a lymphedema index baseline score. The patient will be reassessed on a regular basis (typically every 3 months) to obtain new L-Dex scores. If the score is > 6.5 points away from his/her baseline score indicating onset of subclinical lymphedema, it will be recommended to wear a compression garment for 4 weeks, 12 hours per day and then be reassessed. If the score continues to be > 6.5 points from baseline at reassessment, we will initiate lymphedema treatment. Assessing in this manner has a 95% rate of preventing clinically significant lymphedema.  5.6 score - L UE at risk  R UE dominant - no lymph nodes remove L-Dex score at baseline    PATIENT EDUCATION:  Education details: Lymphedema risk reduction and post op shoulder/posture HEP Person educated: Patient and daughter Education method: Explanation, Demonstration, Handout Education comprehension: Patient verbalized understanding and returned demonstration Session 03/17/24:    Patient had on 02/18/2024 done expanders removed and silicone implant put in bilaterally.   Pt started radiation this week .   Had some tightness the first 2  sessions  Doing better than I think  Patient walk in with measurements the same for shoulder motion on the left.  But decreased on the right. Reviewed with patient with home exercises she has been doing at home Continue to have more tightness  on the left than the right. Changed home exercises for patient to use her pulleys to 3 times a day 20 reps 2 to up to 2 minutes for flexion and abduction. Patient fatigues.  Patient show increased range of motion at the end of session. After pulleys patient can work on supine on external rotation and radiation position. Also gave her active assisted range of motion on the wall for interlocking fingers and doing shoulder flexion. Patient can do 2-3 times a day 10 reps. Patient show great progress in session again    ASSESSMENT:  CLINICAL IMPRESSION: Patient had Bilateral mastectomy with immediate reconstruction and expanders placement was originally done 12/22/2023.Aaron Aas  Patient had 3 lymph nodes removed from the right and 6 on the left.  Patient had 02/18/2024 expanders removed and silicone implants placed.  Patient was performing some home exercises in supine the last week or 2.  Patient started radiation this week.  Patient's left shoulder range of motion was about the same but lost some right shoulder range of motion.  Change patient's home program to using her pulleys to 3 times a day especially before radiation.  Followed by active assisted range of motion on the wall and external rotation in supine.  Patient worried today about returning back to work.  HR told her her work or job is not available in she and he would need to go on the floor doing more physical work.  Patient do have a call into her surgeon.  But was also asking about her notes to be sent to short-term disability.  Put her in contact with our nurse that can assist with paperwork.  Also discussed with patient CARE program -patient  is interested for referral.. Patient can benefit from skilled OT  services to increase motion and strength in bilateral upper extremity and monitor her for lymphedema symptoms and signs.  To return to prior level of function.    Pt will benefit from skilled therapeutic intervention to improve on the following deficits: Decreased knowledge of precautions and lymphedema education, impaired UE functional use, pain, decreased ROM, postural dysfunction.   OT treatment/interventions: ADL/self-care home management, pt/family education, therapeutic exercise,manual therapy  REHAB POTENTIAL: Good  CLINICAL DECISION MAKING: Stable/uncomplicated  EVALUATION COMPLEXITY: Low   GOALS: Goals reviewed with patient? YES  LONG TERM GOALS: (STG=LTG)    Name Target Date Goal status  1 Pt will be able to verbalize understanding of pertinent lymphedema risk reduction practices relevant to her dx specifically related to skin care.  Baseline:  No knowledge 12 weeks ONGOING  2 Pt will be able to return demo and/or verbalize understanding of the post op HEP related to regaining shoulder ROM. Baseline:  No knowledge 12 weeks Ongoing       4 Pt will demo she has regained full shoulder ROM and function post operatively compared to baselines.  Baseline: See objective measurements taken today. 12 wks  ONGOING     PLAN:  OT FREQUENCY/DURATION: 6 visits 12 weeks     Occupational Therapy Information for After Breast Cancer Surgery/Treatment:  Lymphedema is a swelling condition that you may be at risk for in your arm if you have lymph nodes removed from the armpit area.  After a sentinel node biopsy, the risk is approximately 5-9% and is higher after an axillary node dissection.  There is treatment available for this condition and it is not life-threatening.  Contact your physician or occupational therapist with concerns. You may begin the 4 shoulder/posture exercises (see additional sheet) when permitted by your physician (typically a week after surgery).  If you have drains,  you may need to wait until those are removed before beginning range of motion exercises.  A general recommendation is to not lift your arms above shoulder height until drains are removed.  These exercises should be done to your tolerance and gently.  This is not a "no pain/no gain" type of recovery so listen to your body and stretch into the range of motion that you can tolerate, stopping if you have pain.  If you are having immediate reconstruction, ask your plastic surgeon about doing exercises as he or she may want you to wait. We encourage you to watch the ABC (After Breast Cancer)  video. You will learn information related to lymphedema risk, prevention and treatment and additional exercises to regain mobility following surgery.  While undergoing any medical procedure or treatment, try to avoid blood pressure being taken or needle sticks from occurring on the arm on the side of cancer.   This recommendation begins after surgery and continues for the rest of your life.  This may help reduce your risk of getting lymphedema (swelling in your arm). An excellent resource for those seeking information on lymphedema is the National Lymphedema Network's web site. It can be accessed at www.lymphnet.org If you notice swelling in your hand, arm or breast at any time following surgery (even if it is many years from now), please contact your doctor or occupational therapist to discuss this.  Lymphedema can be treated at any time but it is easier for you if it is treated early on.  If you feel like your shoulder motion is not returning to  normal in a reasonable amount of time, please contact your surgeon or occupational therapist.  [facility] [phone]. [address], [place], [place] [address]      Heloise Lobo, OTR/L,CLT 03/31/2024, 4:05 PM

## 2024-03-31 NOTE — Telephone Encounter (Signed)
 Patient called and said that she is set to return to work on 04/05/24, she said she just started doing radiation this week. She just found out that she is not going to be doing the same position she did before and that it will be a more physically demanding job and she wanted to know if that will be ok, or if she needs to follow up with us  first. Please advise. [phone] is best call back

## 2024-04-01 ENCOUNTER — Ambulatory Visit
Admission: RE | Admit: 2024-04-01 | Discharge: 2024-04-01 | Disposition: A | Discharge status: Home / Self Care | Source: Ambulatory Visit | Attending: Radiation Oncology | Admitting: Radiation Oncology

## 2024-04-01 ENCOUNTER — Other Ambulatory Visit: Payer: Self-pay

## 2024-04-01 ENCOUNTER — Other Ambulatory Visit: Payer: Self-pay | Admitting: *Deleted

## 2024-04-01 ENCOUNTER — Encounter: Payer: Self-pay | Admitting: Occupational Therapy

## 2024-04-01 DIAGNOSIS — C50912 Malignant neoplasm of unspecified site of left female breast: Secondary | ICD-10-CM

## 2024-04-01 DIAGNOSIS — Z170 Estrogen receptor positive status [ER+]: Secondary | ICD-10-CM | POA: Diagnosis not present

## 2024-04-01 DIAGNOSIS — C50911 Malignant neoplasm of unspecified site of right female breast: Secondary | ICD-10-CM

## 2024-04-01 DIAGNOSIS — C50412 Malignant neoplasm of upper-outer quadrant of left female breast: Secondary | ICD-10-CM | POA: Diagnosis not present

## 2024-04-01 DIAGNOSIS — Z510 Encounter for antineoplastic radiation therapy: Secondary | ICD-10-CM | POA: Diagnosis not present

## 2024-04-01 LAB — RAD ONC ARIA SESSION SUMMARY
Course Elapsed Days: 3
Plan Fractions Treated to Date: 4
Plan Prescribed Dose Per Fraction: 1.8 Gy
Plan Total Fractions Prescribed: 28
Plan Total Prescribed Dose: 50.4 Gy
Reference Point Dosage Given to Date: 7.2 Gy
Reference Point Session Dosage Given: 1.8 Gy
Session Number: 4

## 2024-04-02 ENCOUNTER — Inpatient Hospital Stay

## 2024-04-02 ENCOUNTER — Other Ambulatory Visit: Payer: Self-pay

## 2024-04-02 ENCOUNTER — Ambulatory Visit
Admission: RE | Admit: 2024-04-02 | Discharge: 2024-04-02 | Disposition: A | Discharge status: Home / Self Care | Source: Ambulatory Visit | Attending: Radiation Oncology | Admitting: Radiation Oncology

## 2024-04-02 DIAGNOSIS — Z510 Encounter for antineoplastic radiation therapy: Secondary | ICD-10-CM | POA: Diagnosis not present

## 2024-04-02 DIAGNOSIS — C50412 Malignant neoplasm of upper-outer quadrant of left female breast: Secondary | ICD-10-CM | POA: Diagnosis not present

## 2024-04-02 DIAGNOSIS — Z170 Estrogen receptor positive status [ER+]: Secondary | ICD-10-CM | POA: Diagnosis not present

## 2024-04-02 LAB — CBC (CANCER CENTER ONLY)
HCT: 39.6 % (ref 36.0–46.0)
Hemoglobin: 13.2 g/dL (ref 12.0–15.0)
MCH: 30.7 pg (ref 26.0–34.0)
MCHC: 33.3 g/dL (ref 30.0–36.0)
MCV: 92.1 fL (ref 80.0–100.0)
Platelet Count: 310 10*3/uL (ref 150–400)
RBC: 4.3 MIL/uL (ref 3.87–5.11)
RDW: 13 % (ref 11.5–15.5)
WBC Count: 6.2 10*3/uL (ref 4.0–10.5)
nRBC: 0 % (ref 0.0–0.2)

## 2024-04-02 LAB — RAD ONC ARIA SESSION SUMMARY
Course Elapsed Days: 4
Plan Fractions Treated to Date: 5
Plan Prescribed Dose Per Fraction: 1.8 Gy
Plan Total Fractions Prescribed: 28
Plan Total Prescribed Dose: 50.4 Gy
Reference Point Dosage Given to Date: 9 Gy
Reference Point Session Dosage Given: 1.8 Gy
Session Number: 5

## 2024-04-05 ENCOUNTER — Telehealth: Payer: Self-pay | Admitting: *Deleted

## 2024-04-05 ENCOUNTER — Ambulatory Visit
Admission: RE | Admit: 2024-04-05 | Discharge: 2024-04-05 | Disposition: A | Discharge status: Home / Self Care | Source: Ambulatory Visit | Attending: Radiation Oncology | Admitting: Radiation Oncology

## 2024-04-05 ENCOUNTER — Other Ambulatory Visit: Payer: Self-pay

## 2024-04-05 ENCOUNTER — Telehealth: Payer: Self-pay

## 2024-04-05 ENCOUNTER — Other Ambulatory Visit: Payer: Self-pay | Admitting: *Deleted

## 2024-04-05 DIAGNOSIS — Z510 Encounter for antineoplastic radiation therapy: Secondary | ICD-10-CM | POA: Diagnosis not present

## 2024-04-05 DIAGNOSIS — Z170 Estrogen receptor positive status [ER+]: Secondary | ICD-10-CM | POA: Diagnosis not present

## 2024-04-05 DIAGNOSIS — C50412 Malignant neoplasm of upper-outer quadrant of left female breast: Secondary | ICD-10-CM | POA: Diagnosis not present

## 2024-04-05 DIAGNOSIS — Z1211 Encounter for screening for malignant neoplasm of colon: Secondary | ICD-10-CM

## 2024-04-05 LAB — RAD ONC ARIA SESSION SUMMARY
Course Elapsed Days: 7
Plan Fractions Treated to Date: 6
Plan Prescribed Dose Per Fraction: 1.8 Gy
Plan Total Fractions Prescribed: 28
Plan Total Prescribed Dose: 50.4 Gy
Reference Point Dosage Given to Date: 10.8 Gy
Reference Point Session Dosage Given: 1.8 Gy
Session Number: 6

## 2024-04-05 MED ORDER — NA SULFATE-K SULFATE-MG SULF 17.5-3.13-1.6 GM/177ML PO SOLN: 1.0000 | Freq: Once | ORAL | 0 refills | Status: AC

## 2024-04-05 NOTE — Telephone Encounter (Signed)
 Pt requesting call back to schedule colonoscopy.

## 2024-04-05 NOTE — Telephone Encounter (Signed)
 Gastroenterology Pre-Procedure Review  Request Date: 05/31/2024  Requesting Physician: Dr. Ole Berkeley  PATIENT REVIEW QUESTIONS: The patient responded to the following health history questions as indicated:    1. Are you having any GI issues? no 2. Do you have a personal history of Polyps? no 3. Do you have a family history of Colon Cancer or Polyps? no 4. Diabetes Mellitus? no 5. Joint replacements in the past 12 months?no 6. Major health problems in the past 3 months?no 7. Any artificial heart valves, MVP, or defibrillator?no    MEDICATIONS & ALLERGIES:    Patient reports the following regarding taking any anticoagulation/antiplatelet therapy:   Plavix, Coumadin, Eliquis, Xarelto, Lovenox, Pradaxa, Brilinta, or Effient? no Aspirin? no  Patient confirms/reports the following medications:  Current Outpatient Medications  Medication Sig Dispense Refill   acetaminophen  (TYLENOL ) 500 MG tablet Take 500 mg by mouth every 6 (six) hours as needed.     diphenhydrAMINE (BENADRYL) 25 MG tablet Take 25 mg by mouth daily as needed for allergies.     ondansetron  (ZOFRAN ) 4 MG tablet Take 1 tablet (4 mg total) by mouth every 8 (eight) hours as needed for nausea or vomiting. (Patient not taking: Reported on 03/23/2024) 20 tablet 0   ondansetron  (ZOFRAN ) 4 MG tablet Take 1 tablet (4 mg total) by mouth every 8 (eight) hours as needed for nausea or vomiting. (Patient not taking: Reported on 03/23/2024) 20 tablet 0   No current facility-administered medications for this visit.    Patient confirms/reports the following allergies:  Allergies  Allergen Reactions   Codeine Other (See Comments)    Swelling    No orders of the defined types were placed in this encounter.   AUTHORIZATION INFORMATION Primary Insurance: 1D#: Group #:  Secondary Insurance: 1D#: Group #:  SCHEDULE INFORMATION: Date: 05/31/2024 Time: Location:  [facility]

## 2024-04-05 NOTE — Telephone Encounter (Signed)
 Colonoscopy schedule with Dr Ole Berkeley on 05/31/2024

## 2024-04-06 ENCOUNTER — Other Ambulatory Visit: Payer: Self-pay

## 2024-04-06 ENCOUNTER — Ambulatory Visit
Admission: RE | Admit: 2024-04-06 | Discharge: 2024-04-06 | Disposition: A | Discharge status: Home / Self Care | Source: Ambulatory Visit | Attending: Radiation Oncology | Admitting: Radiation Oncology

## 2024-04-06 DIAGNOSIS — Z510 Encounter for antineoplastic radiation therapy: Secondary | ICD-10-CM | POA: Diagnosis not present

## 2024-04-06 DIAGNOSIS — C50412 Malignant neoplasm of upper-outer quadrant of left female breast: Secondary | ICD-10-CM | POA: Diagnosis not present

## 2024-04-06 DIAGNOSIS — Z170 Estrogen receptor positive status [ER+]: Secondary | ICD-10-CM | POA: Diagnosis not present

## 2024-04-06 LAB — RAD ONC ARIA SESSION SUMMARY
Course Elapsed Days: 8
Plan Fractions Treated to Date: 7
Plan Prescribed Dose Per Fraction: 1.8 Gy
Plan Total Fractions Prescribed: 28
Plan Total Prescribed Dose: 50.4 Gy
Reference Point Dosage Given to Date: 12.6 Gy
Reference Point Session Dosage Given: 1.8 Gy
Session Number: 7

## 2024-04-07 ENCOUNTER — Ambulatory Visit
Admission: RE | Admit: 2024-04-07 | Discharge: 2024-04-07 | Disposition: A | Discharge status: Home / Self Care | Source: Ambulatory Visit | Attending: Radiation Oncology | Admitting: Radiation Oncology

## 2024-04-07 ENCOUNTER — Other Ambulatory Visit: Payer: Self-pay

## 2024-04-07 ENCOUNTER — Telehealth: Payer: Self-pay | Admitting: Oncology

## 2024-04-07 DIAGNOSIS — C50412 Malignant neoplasm of upper-outer quadrant of left female breast: Secondary | ICD-10-CM | POA: Diagnosis not present

## 2024-04-07 DIAGNOSIS — Z170 Estrogen receptor positive status [ER+]: Secondary | ICD-10-CM | POA: Diagnosis not present

## 2024-04-07 DIAGNOSIS — Z510 Encounter for antineoplastic radiation therapy: Secondary | ICD-10-CM | POA: Diagnosis not present

## 2024-04-07 LAB — RAD ONC ARIA SESSION SUMMARY
Course Elapsed Days: 9
Plan Fractions Treated to Date: 8
Plan Prescribed Dose Per Fraction: 1.8 Gy
Plan Total Fractions Prescribed: 28
Plan Total Prescribed Dose: 50.4 Gy
Reference Point Dosage Given to Date: 14.4 Gy
Reference Point Session Dosage Given: 1.8 Gy
Session Number: 8

## 2024-04-07 NOTE — Telephone Encounter (Signed)
 Called patient to give information on how to get Bone Density scan scheduled- left voicemail and asked her to give a call.

## 2024-04-08 ENCOUNTER — Encounter: Payer: Self-pay | Admitting: Internal Medicine

## 2024-04-08 ENCOUNTER — Other Ambulatory Visit: Payer: Self-pay

## 2024-04-08 ENCOUNTER — Ambulatory Visit
Admission: RE | Admit: 2024-04-08 | Discharge: 2024-04-08 | Disposition: A | Discharge status: Home / Self Care | Source: Ambulatory Visit | Attending: Internal Medicine | Admitting: Internal Medicine

## 2024-04-08 DIAGNOSIS — C50412 Malignant neoplasm of upper-outer quadrant of left female breast: Secondary | ICD-10-CM | POA: Diagnosis not present

## 2024-04-08 DIAGNOSIS — Z510 Encounter for antineoplastic radiation therapy: Secondary | ICD-10-CM | POA: Diagnosis not present

## 2024-04-08 DIAGNOSIS — Z170 Estrogen receptor positive status [ER+]: Secondary | ICD-10-CM | POA: Diagnosis not present

## 2024-04-08 LAB — RAD ONC ARIA SESSION SUMMARY
Course Elapsed Days: 10
Plan Fractions Treated to Date: 9
Plan Prescribed Dose Per Fraction: 1.8 Gy
Plan Total Fractions Prescribed: 28
Plan Total Prescribed Dose: 50.4 Gy
Reference Point Dosage Given to Date: 16.2 Gy
Reference Point Session Dosage Given: 1.8 Gy
Session Number: 9

## 2024-04-08 NOTE — Progress Notes (Signed)
 Outpatient Surgical Follow Up   Angelica Dougherty is an 53 y.o. female.   Chief Complaint  Patient presents with   Routine Post Op    HPI: Patient returns today status post bilateral mastectomy with reconstruction.  One of her margins was positive given that the lesion was very close to the chest wall.  I have discussed with oncology and they recommend that she undergo a course of radiation treatment.  Otherwise she reports doing well.  She is seeing plastic surgery and having her fills.  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 [facility]   BREAST BIOPSY Right 09/25/2023   stereo bx, calcs, RIBBON clip-path pending   BREAST BIOPSY Left 09/25/2023   US  bx, savi tag as marker, path pending   BREAST BIOPSY Right 09/25/2023   MM RT BREAST BX W LOC DEV 1ST LESION IMAGE BX SPEC STEREO GUIDE 09/25/2023 [facility]   BREAST BIOPSY Left 09/25/2023   US  LT BREAST BX W LOC DEV 1ST LESION IMG BX SPEC US  GUIDE 09/25/2023 [facility]   BREAST EXCISIONAL BIOPSY Right 1993   benign   BREAST LUMPECTOMY Right 1991   benign   BREAST RECONSTRUCTION WITH PLACEMENT OF TISSUE EXPANDER AND FLEX HD (ACELLULAR HYDRATED DERMIS) Bilateral 12/22/2023   Procedure: BREAST RECONSTRUCTION WITH PLACEMENT OF TISSUE EXPANDER AND FLEX HD (ACELLULAR HYDRATED DERMIS);  Surgeon: Thornell Flirt, DO;  Location: [facility];  Service: Plastics;  Laterality: Bilateral;   CESAREAN SECTION  12/16/2005   MASTECTOMY W/ SENTINEL NODE  BIOPSY Bilateral 12/22/2023   Procedure: MASTECTOMY WITH SENTINEL LYMPH NODE BIOPSY, simple mastectomy;  Surgeon: Barrett Lick, MD;  Location: [facility];  Service: General;  Laterality: Bilateral;   REMOVAL OF TISSUE EXPANDER AND PLACEMENT OF IMPLANT Bilateral 02/18/2024   Procedure: REMOVAL, TISSUE EXPANDER, BREAST, WITH IMPLANT INSERTION;  Surgeon: Thornell Flirt, DO;  Location: [facility];  Service: Plastics;  Laterality: Bilateral;    Family History  Problem Relation Age of Onset   Asthma Mother    Cancer Maternal Uncle        back cancer at 60   Breast cancer Paternal Aunt        dx 30s, bilateral   Breast cancer Paternal Aunt        dx 18s   Breast cancer Cousin        dx 18s    Social History:  reports that she has never smoked. She has never been exposed to tobacco smoke. She has never used smokeless tobacco. She reports that she does not drink alcohol and does not use drugs.  Allergies:  Allergies  Allergen Reactions   Codeine Other (See Comments)    Swelling    Medications reviewed.    ROS Full ROS performed and is otherwise negative other than what is stated in HPI   BP 120/72   Pulse 89   Temp 98 F (36.7 C)   Ht 6\' 1"  (1.854 m)   Wt 187 lb (84.8 kg)  LMP 06/08/2006 (Approximate) Comment: Hysterectomy  SpO2 97%   BMI 24.67 kg/m   Physical Exam Bilateral breast incisions are healing well.  Drains have been removed today.  No erythema or drainage from the breast.    No results found for this or any previous visit (from the past 48 hours). No results found.  Assessment/Plan:  Patient status post bilateral mastectomy.  She did have a positive margin and will undergo radiation therapy.  Will see her back again in 6 months   Severa Daniels, M.D. [facility]

## 2024-04-08 NOTE — Progress Notes (Signed)
 Outpatient Surgical Follow Up    Angelica Dougherty is an 53 y.o. female.   Chief Complaint  Patient presents with   Routine Post Op    HPI: The patient returns today status post bilateral mastectomy.  She reports doing well.  She is seeing plastic surgery.  She denies any overlying erythema or drainage from the wound.  Her pain is well-controlled.  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 [facility]   BREAST BIOPSY Right 09/25/2023   stereo bx, calcs, RIBBON clip-path pending   BREAST BIOPSY Left 09/25/2023   US  bx, savi tag as marker, path pending   BREAST BIOPSY Right 09/25/2023   MM RT BREAST BX W LOC DEV 1ST LESION IMAGE BX SPEC STEREO GUIDE 09/25/2023 [facility]   BREAST BIOPSY Left 09/25/2023   US  LT BREAST BX W LOC DEV 1ST LESION IMG BX SPEC US  GUIDE 09/25/2023 [facility]   BREAST EXCISIONAL BIOPSY Right 1993   benign   BREAST LUMPECTOMY Right 1991   benign   BREAST RECONSTRUCTION WITH PLACEMENT OF TISSUE EXPANDER AND FLEX HD (ACELLULAR HYDRATED DERMIS) Bilateral 12/22/2023   Procedure: BREAST RECONSTRUCTION WITH PLACEMENT OF TISSUE EXPANDER AND FLEX HD (ACELLULAR HYDRATED DERMIS);  Surgeon: Thornell Flirt, DO;  Location: [facility];  Service: Plastics;  Laterality: Bilateral;   CESAREAN SECTION  12/16/2005   MASTECTOMY W/ SENTINEL NODE BIOPSY Bilateral 12/22/2023   Procedure: MASTECTOMY WITH SENTINEL LYMPH NODE BIOPSY, simple mastectomy;  Surgeon: Barrett Lick, MD;   Location: [facility];  Service: General;  Laterality: Bilateral;   REMOVAL OF TISSUE EXPANDER AND PLACEMENT OF IMPLANT Bilateral 02/18/2024   Procedure: REMOVAL, TISSUE EXPANDER, BREAST, WITH IMPLANT INSERTION;  Surgeon: Thornell Flirt, DO;  Location: [facility];  Service: Plastics;  Laterality: Bilateral;    Family History  Problem Relation Age of Onset   Asthma Mother    Cancer Maternal Uncle        back cancer at 48   Breast cancer Paternal Aunt        dx 30s, bilateral   Breast cancer Paternal Aunt        dx 71s   Breast cancer Cousin        dx 35s    Social History:  reports that she has never smoked. She has never been exposed to tobacco smoke. She has never used smokeless tobacco. She reports that she does not drink alcohol and does not use drugs.  Allergies:  Allergies  Allergen Reactions   Codeine Other (See Comments)    Swelling    Medications reviewed.    ROS Full ROS performed and is otherwise negative other than what is stated in HPI   BP (!) 154/84   Pulse 75   Temp 98 F (36.7 C)   Ht 6\' 1"  (1.854 m)   LMP 06/08/2006 (Approximate) Comment: Hysterectomy  SpO2 98%   BMI 24.67 kg/m   Physical Exam Bilateral breasts are healing well.  She still has drains in place.  There is no surrounding erythema or signs of infection.    Pathology discussed with patient and copied below: FINAL DIAGNOSIS       1. Breast, simple mastectomy, left :      - INVASIVE DUCTAL CARCINOMA WITH FOCAL LOBULAR FEATURES      - SEE ONCOLOGY TABLE AND NOTE       2. Lymph node, sentinel, biopsy, #2 right :      - ONE LYMPH NODE, NEGATIVE FOR METASTATIC CARCINOMA (0/1)      - SEE ONCOLOGY TABLE AND NOTE       3. Lymph node, sentinel, biopsy, #3 right :      - ONE LYMPH NODE, NEGATIVE FOR METASTATIC CARCINOMA (0/1)      - SEE ONCOLOGY TABLE AND NOTE       4. Lymph node, sentinel, biopsy, #1left :      - FOCAL BREAST PARENCHYMA WITH MARKED THERMAL  ARTIFACT      - BENIGN FIBROADIPOSE TISSUE AND FOCAL MUSCLE      - LYMPHOID TISSUE IS NOT IDENTIFIED      - NEGATIVE FOR DEFINITIVE MALIGNANCY      - SEE ONCOLOGY TABLE AND NOTE       5. Lymph node, sentinel, biopsy, #2 left :      - FOCAL BREAST PARENCHYMA WITH SECRETORY CHANGE, ADENOSIS, FIBROCYSTIC CHANGE      AND THERMAL ARTIFACT      - BENIGN FIBROADIPOSE TISSUE AND SCANT MUSCLE      - LYMPHOID TISSUE IS NOT IDENTIFIED      - NEGATIVE FOR MALIGNANCY      - SEE NOTE AND ONCOLOGY TABLE       6. Lymph node, sentinel, biopsy, #3 left :      - BREAST PARENCHYMA WITH FIBROCYSTIC CHANGES SECRETORY CHANGE, ADENOSIS AND      MARKED THERMAL ARTIFACT      - BENIGN FIBROADIPOSE TISSUE      - LYMPHOID TISSUE IS NOT IDENTIFIED      - NEGATIVE FOR DEFINITIVE MALIGNANCY      - SEE ONCOLOGY TABLE AND NOTE       7. Lymph node, sentinel, biopsy, #4 left :      - FOCAL BREAST PARENCHYMA WITH SECRETORY CHANGE, ADENOSIS  AND THERMAL ARTIFACT      - BENIGN FIBROADIPOSE TISSUE      - LYMPHOID TISSUE IS NOT IDENTIFIED      - NEGATIVE FOR MALIGNANCY      - SEE NOTE AND ONCOLOGY TABLE       8. Lymph node, sentinel, biopsy, #5 left :      - FOCAL BREAST PARENCHYMA WITH SECRETORY CHANGE AND THERMAL ARTIFACT      - BENIGN FIBROADIPOSE TISSUE AND SCANT MUSCLE      - LYMPHOID TISSUE IS NOT IDENTIFIED      - NEGATIVE FOR MALIGNANCY      - SEE NOTE AND ONCOLOGY TABLE       9. Lymph node, sentinel, biopsy, #6 left :      - ONE LYMPH NODE POSITIVE FOR INDIVIDUAL TUMOR CELLS (0/1)      - SEE ONCOLOGY TABLE AND NOTE       10. Breast, simple mastectomy, Right :      - MULTIFOCAL, INVASIVE CARCINOMA WITH MIXED DUCTAL AND LOBULAR FEATURES      - INVASIVE DUCTAL CARCINOMA AND DUCTAL CARCINOMA IN SITU (DCIS)      - SEE ONCOLOGY TABLE AND NOTE  11. Lymph node, sentinel, biopsy, #1right :      - ONE LYMPH NODE, NEGATIVE FOR METASTATIC CARCINOMA (0/1)      - SEE ONCOLOGY TABLE AND NOTE     Assessment/Plan: Doing well, will need to discuss with oncology about what to do given positive margins, will see her back in several weeks once her oncotype is done as well    Severa Daniels, M.D. [facility]

## 2024-04-09 ENCOUNTER — Other Ambulatory Visit: Payer: Self-pay

## 2024-04-09 ENCOUNTER — Ambulatory Visit
Admission: RE | Admit: 2024-04-09 | Discharge: 2024-04-09 | Disposition: A | Discharge status: Home / Self Care | Source: Ambulatory Visit | Attending: Radiation Oncology | Admitting: Radiation Oncology

## 2024-04-09 DIAGNOSIS — Z170 Estrogen receptor positive status [ER+]: Secondary | ICD-10-CM | POA: Diagnosis not present

## 2024-04-09 DIAGNOSIS — Z510 Encounter for antineoplastic radiation therapy: Secondary | ICD-10-CM | POA: Diagnosis not present

## 2024-04-09 DIAGNOSIS — C50412 Malignant neoplasm of upper-outer quadrant of left female breast: Secondary | ICD-10-CM | POA: Diagnosis not present

## 2024-04-09 LAB — RAD ONC ARIA SESSION SUMMARY
Course Elapsed Days: 11
Plan Fractions Treated to Date: 10
Plan Prescribed Dose Per Fraction: 1.8 Gy
Plan Total Fractions Prescribed: 28
Plan Total Prescribed Dose: 50.4 Gy
Reference Point Dosage Given to Date: 18 Gy
Reference Point Session Dosage Given: 1.8 Gy
Session Number: 10

## 2024-04-12 ENCOUNTER — Ambulatory Visit
Admission: RE | Admit: 2024-04-12 | Discharge: 2024-04-12 | Disposition: A | Discharge status: Home / Self Care | Source: Ambulatory Visit | Attending: Radiation Oncology | Admitting: Radiation Oncology

## 2024-04-12 ENCOUNTER — Other Ambulatory Visit: Payer: Self-pay

## 2024-04-12 DIAGNOSIS — Z170 Estrogen receptor positive status [ER+]: Secondary | ICD-10-CM | POA: Diagnosis not present

## 2024-04-12 DIAGNOSIS — Z510 Encounter for antineoplastic radiation therapy: Secondary | ICD-10-CM | POA: Diagnosis not present

## 2024-04-12 DIAGNOSIS — C50412 Malignant neoplasm of upper-outer quadrant of left female breast: Secondary | ICD-10-CM | POA: Diagnosis not present

## 2024-04-12 LAB — RAD ONC ARIA SESSION SUMMARY
Course Elapsed Days: 14
Plan Fractions Treated to Date: 11
Plan Prescribed Dose Per Fraction: 1.8 Gy
Plan Total Fractions Prescribed: 28
Plan Total Prescribed Dose: 50.4 Gy
Reference Point Dosage Given to Date: 19.8 Gy
Reference Point Session Dosage Given: 1.8 Gy
Session Number: 11

## 2024-04-13 ENCOUNTER — Other Ambulatory Visit: Payer: Self-pay

## 2024-04-13 ENCOUNTER — Ambulatory Visit
Admission: RE | Admit: 2024-04-13 | Discharge: 2024-04-13 | Disposition: A | Discharge status: Home / Self Care | Source: Ambulatory Visit | Attending: Radiation Oncology | Admitting: Radiation Oncology

## 2024-04-13 ENCOUNTER — Encounter: Attending: Internal Medicine

## 2024-04-13 VITALS — Ht 72.87 in | Wt 183.7 lb

## 2024-04-13 DIAGNOSIS — Z170 Estrogen receptor positive status [ER+]: Secondary | ICD-10-CM | POA: Diagnosis not present

## 2024-04-13 DIAGNOSIS — C50412 Malignant neoplasm of upper-outer quadrant of left female breast: Secondary | ICD-10-CM | POA: Diagnosis not present

## 2024-04-13 DIAGNOSIS — C50912 Malignant neoplasm of unspecified site of left female breast: Secondary | ICD-10-CM

## 2024-04-13 DIAGNOSIS — Z510 Encounter for antineoplastic radiation therapy: Secondary | ICD-10-CM | POA: Diagnosis not present

## 2024-04-13 LAB — RAD ONC ARIA SESSION SUMMARY
Course Elapsed Days: 15
Plan Fractions Treated to Date: 12
Plan Prescribed Dose Per Fraction: 1.8 Gy
Plan Total Fractions Prescribed: 28
Plan Total Prescribed Dose: 50.4 Gy
Reference Point Dosage Given to Date: 21.6 Gy
Reference Point Session Dosage Given: 1.8 Gy
Session Number: 12

## 2024-04-13 NOTE — Patient Instructions (Signed)
 Patient Instructions  Patient Details  Name: Angelica Dougherty MRN: [id number] Date of Birth: 1971-03-26 Referring Provider:  Agrawal, Kavita, MD  Below are your personal goals for exercise, nutrition, and risk factors. Our goal is to help you stay on track towards obtaining and maintaining these goals. We will be discussing your progress on these goals with you throughout the program.  Initial Exercise Prescription:  Initial Exercise Prescription - 04/13/24 1300       Date of Initial Exercise RX and Referring Provider   Date 04/13/24    Referring Provider Agrawal, Kavita, MD      Oxygen   Maintain Oxygen Saturation 88% or higher      Treadmill   MPH 2.6    Grade 0    Minutes 15    METs 2.99      Recumbant Bike   Level 4    RPM 50    Watts 63    Minutes 15    METs 4.41      NuStep   Level 4   T4 & T6   SPM 80    Minutes 15    METs 4.41      REL-XR   Level 2    Speed 50    Minutes 15    METs 4.41      T5 Nustep   Level 4    SPM 80    Minutes 15    METs 4.41      Track   Laps 38    Minutes 15    METs 3.07      Prescription Details   Frequency (times per week) 2    Duration Progress to 30 minutes of continuous aerobic without signs/symptoms of physical distress      Intensity   THRR 40-80% of Max Heartrate 110-148    Ratings of Perceived Exertion 11-13    Perceived Dyspnea 0-4      Progression   Progression Continue to progress workloads to maintain intensity without signs/symptoms of physical distress.      Resistance Training   Training Prescription Yes    Weight 3 lb    Reps 10-15             Exercise Goals: Frequency: Be able to perform aerobic exercise two to three times per week in program working toward 2-5 days per week of home exercise.  Intensity: Work with a perceived exertion of 11 (fairly light) - 15 (hard) while following your exercise prescription.  We will make changes to your prescription with you as you progress through  the program.   Duration: Be able to do 30 to 45 minutes of continuous aerobic exercise in addition to a 5 minute warm-up and a 5 minute cool-down routine.   Nutrition Goals: Your personal nutrition goals will be established when you do your nutrition analysis with the dietician.  The following are general nutrition guidelines to follow: Cholesterol < 200mg /day Sodium < 1500mg /day Fiber: Women over 50 yrs - 21 grams per day  Personal Goals:   Tobacco Use Initial Evaluation: Social History   Tobacco Use  Smoking Status Never   Passive exposure: Never  Smokeless Tobacco Never    Exercise Goals and Review:  Exercise Goals     Row Name 04/13/24 1348             Exercise Goals   Increase Physical Activity Yes       Intervention Provide advice, education, support and counseling about physical activity/exercise  needs.;Develop an individualized exercise prescription for aerobic and resistive training based on initial evaluation findings, risk stratification, comorbidities and participant's personal goals.       Expected Outcomes Short Term: Attend rehab on a regular basis to increase amount of physical activity.;Long Term: Add in home exercise to make exercise part of routine and to increase amount of physical activity.;Long Term: Exercising regularly at least 3-5 days a week.       Increase Strength and Stamina Yes       Intervention Provide advice, education, support and counseling about physical activity/exercise needs.;Develop an individualized exercise prescription for aerobic and resistive training based on initial evaluation findings, risk stratification, comorbidities and participant's personal goals.       Expected Outcomes Short Term: Increase workloads from initial exercise prescription for resistance, speed, and METs.;Short Term: Perform resistance training exercises routinely during rehab and add in resistance training at home;Long Term: Improve cardiorespiratory fitness,  muscular endurance and strength as measured by increased METs and functional capacity ( )       Able to understand and use rate of perceived exertion (RPE) scale Yes       Intervention Provide education and explanation on how to use RPE scale       Expected Outcomes Short Term: Able to use RPE daily in rehab to express subjective intensity level;Long Term:  Able to use RPE to guide intensity level when exercising independently       Able to understand and use Dyspnea scale Yes       Intervention Provide education and explanation on how to use Dyspnea scale       Expected Outcomes Short Term: Able to use Dyspnea scale daily in rehab to express subjective sense of shortness of breath during exertion;Long Term: Able to use Dyspnea scale to guide intensity level when exercising independently       Knowledge and understanding of Target Heart Rate Range (THRR) Yes       Intervention Provide education and explanation of THRR including how the numbers were predicted and where they are located for reference       Expected Outcomes Short Term: Able to state/look up THRR;Short Term: Able to use daily as guideline for intensity in rehab;Long Term: Able to use THRR to govern intensity when exercising independently       Able to check pulse independently Yes       Intervention Provide education and demonstration on how to check pulse in carotid and radial arteries.;Review the importance of being able to check your own pulse for safety during independent exercise       Expected Outcomes Short Term: Able to explain why pulse checking is important during independent exercise;Long Term: Able to check pulse independently and accurately       Understanding of Exercise Prescription Yes       Intervention Provide education, explanation, and written materials on patient's individual exercise prescription       Expected Outcomes Short Term: Able to explain program exercise prescription;Long Term: Able to explain home  exercise prescription to exercise independently

## 2024-04-13 NOTE — Progress Notes (Signed)
 Daily Session Note  Patient Details  Name: Angelica Dougherty MRN: [id number] Date of Birth: 06/28/1971 Referring Provider:   Gattis Kass Cancer Associated Rehabilitation & Exercise from 04/13/2024 in [facility] Re[facility]  Referring Provider Loreatha Rodney, MD       Encounter Date: 04/13/2024  Check In:  Session Check In - 04/13/24 1302       Check-In   Supervising physician immediately available to respond to emergencies See telemetry face sheet for immediately available ER MD    Location [facility]    Staff Present Lyell Samuel, MS, Exercise Physiologist;Noah Tickle, BS, Exercise Physiologist    Virtual Visit No    Medication changes reported     No    Fall or balance concerns reported    No    Warm-up and Cool-down Performed on first and last piece of equipment   and   Resistance Training Performed Yes    VAD Patient? No    PAD/SET Patient? No      Pain Assessment   Currently in Pain? No/denies    Multiple Pain Sites No               Exercise Prescription Changes - 04/13/24 1200       Response to Exercise   Blood Pressure (Admit) 118/70    Blood Pressure (Exercise) 140/72    Blood Pressure (Exit) 122/74    Heart Rate (Admit) 73 bpm    Heart Rate (Exercise) 100 bpm    Heart Rate (Exit) 74 bpm    Oxygen Saturation (Admit) 96 %    Oxygen Saturation (Exercise) 97 %    Oxygen Saturation (Exit) 98 %    Rating of Perceived Exertion (Exercise) 6    Perceived Dyspnea (Exercise) 0    Symptoms none    Comments Results             Social History   Tobacco Use  Smoking Status Never   Passive exposure: Never  Smokeless Tobacco Never    Goals Met:  Independence with exercise equipment Exercise tolerated well No report of concerns or symptoms today  Goals Unmet:  Not Applicable  Comments: First full day of exercise!  Patient was oriented to gym and equipment including functions, settings, policies, and  procedures.  Patient's individual exercise prescription and treatment plan were reviewed.  All starting workloads were established based on the results of the 6 minute walk test done at this visit.  The plan for exercise progression was also introduced and progression will be customized based on patient's performance and goals.    6 Minute Walk     Row Name 04/13/24 1250         6 Minute Walk   Phase Initial     Distance 1420 feet     Walk Time 6 minutes     # of Rest Breaks 0     MPH 2.69     METS 4.41     RPE 6     Perceived Dyspnea  0     VO2 Peak 15.44     Symptoms No     Resting HR 73 bpm     Resting BP 118/70     Resting Oxygen Saturation  96 %     Exercise Oxygen Saturation  during 6 min walk 97 %     Max Ex. HR 100 bpm     Max Ex. BP 140/72     2 Minute  Post BP 122/74                Dr. Firman Hughes is Medical Director for [facility] Cardiac Rehabilitation.  Dr. Fuad Aleskerov is Medical Director for [facility] Pulmonary Rehabilitation.

## 2024-04-14 ENCOUNTER — Ambulatory Visit
Admission: RE | Admit: 2024-04-14 | Discharge: 2024-04-14 | Disposition: A | Discharge status: Home / Self Care | Source: Ambulatory Visit | Attending: Radiation Oncology | Admitting: Radiation Oncology

## 2024-04-14 ENCOUNTER — Other Ambulatory Visit: Payer: Self-pay

## 2024-04-14 DIAGNOSIS — Z170 Estrogen receptor positive status [ER+]: Secondary | ICD-10-CM | POA: Diagnosis not present

## 2024-04-14 DIAGNOSIS — Z510 Encounter for antineoplastic radiation therapy: Secondary | ICD-10-CM | POA: Diagnosis not present

## 2024-04-14 DIAGNOSIS — C50412 Malignant neoplasm of upper-outer quadrant of left female breast: Secondary | ICD-10-CM | POA: Diagnosis not present

## 2024-04-14 LAB — RAD ONC ARIA SESSION SUMMARY
Course Elapsed Days: 16
Plan Fractions Treated to Date: 13
Plan Prescribed Dose Per Fraction: 1.8 Gy
Plan Total Fractions Prescribed: 28
Plan Total Prescribed Dose: 50.4 Gy
Reference Point Dosage Given to Date: 23.4 Gy
Reference Point Session Dosage Given: 1.8 Gy
Session Number: 13

## 2024-04-15 ENCOUNTER — Ambulatory Visit
Admission: RE | Admit: 2024-04-15 | Discharge: 2024-04-15 | Disposition: A | Discharge status: Home / Self Care | Source: Ambulatory Visit | Attending: Radiation Oncology | Admitting: Radiation Oncology

## 2024-04-15 ENCOUNTER — Encounter

## 2024-04-15 ENCOUNTER — Other Ambulatory Visit: Payer: Self-pay

## 2024-04-15 DIAGNOSIS — Z170 Estrogen receptor positive status [ER+]: Secondary | ICD-10-CM | POA: Diagnosis not present

## 2024-04-15 DIAGNOSIS — Z510 Encounter for antineoplastic radiation therapy: Secondary | ICD-10-CM | POA: Diagnosis not present

## 2024-04-15 DIAGNOSIS — C50412 Malignant neoplasm of upper-outer quadrant of left female breast: Secondary | ICD-10-CM | POA: Diagnosis not present

## 2024-04-15 DIAGNOSIS — C50912 Malignant neoplasm of unspecified site of left female breast: Secondary | ICD-10-CM

## 2024-04-15 LAB — RAD ONC ARIA SESSION SUMMARY
Course Elapsed Days: 17
Plan Fractions Treated to Date: 14
Plan Prescribed Dose Per Fraction: 1.8 Gy
Plan Total Fractions Prescribed: 28
Plan Total Prescribed Dose: 50.4 Gy
Reference Point Dosage Given to Date: 25.2 Gy
Reference Point Session Dosage Given: 1.8 Gy
Session Number: 14

## 2024-04-15 NOTE — Progress Notes (Signed)
 Daily Session Note  Patient Details  Name: Angelica Dougherty MRN: [id number] Date of Birth: 30-Jan-1971 Referring Provider:   Gattis Kass Cancer Associated Rehabilitation & Exercise from 04/13/2024 in [facility] Re[facility]  Referring Provider Loreatha Rodney, MD       Encounter Date: 04/15/2024  Check In:  Session Check In - 04/15/24 1322       Check-In   Supervising physician immediately available to respond to emergencies See telemetry face sheet for immediately available ER MD    Location [facility]    Staff Present Fatih Stalvey BS, Exercise Physiologist;Meredith Manson Seitz RN,BSN    Virtual Visit No    Medication changes reported     No    Fall or balance concerns reported    No    Warm-up and Cool-down Performed on first and last piece of equipment    Resistance Training Performed Yes    VAD Patient? No                Social History   Tobacco Use  Smoking Status Never   Passive exposure: Never  Smokeless Tobacco Never    Goals Met:  Independence with exercise equipment Exercise tolerated well No report of concerns or symptoms today  Goals Unmet:  Not Applicable  Comments: Pt able to follow exercise prescription today without complaint.  Will continue to monitor for progression.    Dr. Firman Hughes is Medical Director for [facility] Cardiac Rehabilitation.  Dr. Fuad Aleskerov is Medical Director for [facility] Pulmonary Rehabilitation.

## 2024-04-16 ENCOUNTER — Ambulatory Visit
Admission: RE | Admit: 2024-04-16 | Discharge: 2024-04-16 | Disposition: A | Discharge status: Home / Self Care | Source: Ambulatory Visit | Attending: Radiation Oncology | Admitting: Radiation Oncology

## 2024-04-16 ENCOUNTER — Other Ambulatory Visit: Payer: Self-pay

## 2024-04-16 ENCOUNTER — Inpatient Hospital Stay

## 2024-04-16 DIAGNOSIS — C50412 Malignant neoplasm of upper-outer quadrant of left female breast: Secondary | ICD-10-CM

## 2024-04-16 DIAGNOSIS — Z79899 Other long term (current) drug therapy: Secondary | ICD-10-CM | POA: Insufficient documentation

## 2024-04-16 DIAGNOSIS — Z510 Encounter for antineoplastic radiation therapy: Secondary | ICD-10-CM | POA: Diagnosis not present

## 2024-04-16 DIAGNOSIS — Z170 Estrogen receptor positive status [ER+]: Secondary | ICD-10-CM | POA: Diagnosis not present

## 2024-04-16 DIAGNOSIS — D0511 Intraductal carcinoma in situ of right breast: Secondary | ICD-10-CM | POA: Insufficient documentation

## 2024-04-16 LAB — RAD ONC ARIA SESSION SUMMARY
Course Elapsed Days: 18
Plan Fractions Treated to Date: 15
Plan Prescribed Dose Per Fraction: 1.8 Gy
Plan Total Fractions Prescribed: 28
Plan Total Prescribed Dose: 50.4 Gy
Reference Point Dosage Given to Date: 27 Gy
Reference Point Session Dosage Given: 1.8 Gy
Session Number: 15

## 2024-04-16 LAB — CBC (CANCER CENTER ONLY)
HCT: 37.3 % (ref 36.0–46.0)
Hemoglobin: 12.7 g/dL (ref 12.0–15.0)
MCH: 30.9 pg (ref 26.0–34.0)
MCHC: 34 g/dL (ref 30.0–36.0)
MCV: 90.8 fL (ref 80.0–100.0)
Platelet Count: 259 10*3/uL (ref 150–400)
RBC: 4.11 MIL/uL (ref 3.87–5.11)
RDW: 13.1 % (ref 11.5–15.5)
WBC Count: 5.5 10*3/uL (ref 4.0–10.5)
nRBC: 0 % (ref 0.0–0.2)

## 2024-04-19 ENCOUNTER — Other Ambulatory Visit: Payer: Self-pay

## 2024-04-19 ENCOUNTER — Ambulatory Visit
Admission: RE | Admit: 2024-04-19 | Discharge: 2024-04-19 | Disposition: A | Discharge status: Home / Self Care | Source: Ambulatory Visit | Attending: Radiation Oncology | Admitting: Radiation Oncology

## 2024-04-19 DIAGNOSIS — C50412 Malignant neoplasm of upper-outer quadrant of left female breast: Secondary | ICD-10-CM | POA: Diagnosis not present

## 2024-04-19 DIAGNOSIS — Z510 Encounter for antineoplastic radiation therapy: Secondary | ICD-10-CM | POA: Diagnosis not present

## 2024-04-19 DIAGNOSIS — Z170 Estrogen receptor positive status [ER+]: Secondary | ICD-10-CM | POA: Diagnosis not present

## 2024-04-19 LAB — RAD ONC ARIA SESSION SUMMARY
Course Elapsed Days: 21
Plan Fractions Treated to Date: 16
Plan Prescribed Dose Per Fraction: 1.8 Gy
Plan Total Fractions Prescribed: 28
Plan Total Prescribed Dose: 50.4 Gy
Reference Point Dosage Given to Date: 28.8 Gy
Reference Point Session Dosage Given: 1.8 Gy
Session Number: 16

## 2024-04-20 ENCOUNTER — Encounter

## 2024-04-20 ENCOUNTER — Ambulatory Visit
Admission: RE | Admit: 2024-04-20 | Discharge: 2024-04-20 | Disposition: A | Discharge status: Home / Self Care | Source: Ambulatory Visit | Attending: Radiation Oncology | Admitting: Radiation Oncology

## 2024-04-20 ENCOUNTER — Other Ambulatory Visit: Payer: Self-pay

## 2024-04-20 DIAGNOSIS — C50912 Malignant neoplasm of unspecified site of left female breast: Secondary | ICD-10-CM

## 2024-04-20 DIAGNOSIS — Z170 Estrogen receptor positive status [ER+]: Secondary | ICD-10-CM | POA: Diagnosis not present

## 2024-04-20 DIAGNOSIS — Z510 Encounter for antineoplastic radiation therapy: Secondary | ICD-10-CM | POA: Diagnosis not present

## 2024-04-20 DIAGNOSIS — C50412 Malignant neoplasm of upper-outer quadrant of left female breast: Secondary | ICD-10-CM | POA: Diagnosis not present

## 2024-04-20 LAB — RAD ONC ARIA SESSION SUMMARY
Course Elapsed Days: 22
Plan Fractions Treated to Date: 17
Plan Prescribed Dose Per Fraction: 1.8 Gy
Plan Total Fractions Prescribed: 28
Plan Total Prescribed Dose: 50.4 Gy
Reference Point Dosage Given to Date: 30.6 Gy
Reference Point Session Dosage Given: 1.8 Gy
Session Number: 17

## 2024-04-20 NOTE — Progress Notes (Signed)
 Daily Session Note  Patient Details  Name: Angelica Dougherty MRN: [id number] Date of Birth: 04-04-1971 Referring Provider:   Gattis Kass Cancer Associated Rehabilitation & Exercise from 04/13/2024 in [facility] Card[facility] and Pulmonary Rehab  Referring Provider Loreatha Rodney, MD       Encounter Date: 04/20/2024  Check In:  Session Check In - 04/20/24 1354       Check-In   Supervising physician immediately available to respond to emergencies See telemetry face sheet for immediately available ER MD    Location [facility]    Staff Present Lyell Samuel, MS, Exercise Physiologist;Susanne Bice, RN, BSN, CCRP    Virtual Visit No    Medication changes reported     No    Fall or balance concerns reported    No    Warm-up and Cool-down Performed on first and last piece of equipment    Resistance Training Performed Yes    VAD Patient? No    PAD/SET Patient? No      Pain Assessment   Currently in Pain? No/denies    Multiple Pain Sites No                Social History   Tobacco Use  Smoking Status Never   Passive exposure: Never  Smokeless Tobacco Never    Goals Met:  Independence with exercise equipment Exercise tolerated well No report of concerns or symptoms today  Goals Unmet:  Not Applicable  Comments: Pt able to follow exercise prescription today without complaint.  Will continue to monitor for progression.    Dr. Firman Hughes is Medical Director for [facility] Cardiac Rehabilitation.  Dr. Fuad Aleskerov is Medical Director for [facility] Pulmonary Rehabilitation.

## 2024-04-21 ENCOUNTER — Ambulatory Visit: Attending: Internal Medicine | Admitting: Occupational Therapy

## 2024-04-21 ENCOUNTER — Ambulatory Visit
Admission: RE | Admit: 2024-04-21 | Discharge: 2024-04-21 | Disposition: A | Discharge status: Home / Self Care | Source: Ambulatory Visit | Attending: Radiation Oncology | Admitting: Radiation Oncology

## 2024-04-21 ENCOUNTER — Other Ambulatory Visit: Payer: Self-pay

## 2024-04-21 DIAGNOSIS — M25611 Stiffness of right shoulder, not elsewhere classified: Secondary | ICD-10-CM | POA: Insufficient documentation

## 2024-04-21 DIAGNOSIS — R293 Abnormal posture: Secondary | ICD-10-CM | POA: Insufficient documentation

## 2024-04-21 DIAGNOSIS — M25612 Stiffness of left shoulder, not elsewhere classified: Secondary | ICD-10-CM | POA: Diagnosis not present

## 2024-04-21 DIAGNOSIS — Z510 Encounter for antineoplastic radiation therapy: Secondary | ICD-10-CM | POA: Diagnosis not present

## 2024-04-21 DIAGNOSIS — C50412 Malignant neoplasm of upper-outer quadrant of left female breast: Secondary | ICD-10-CM | POA: Diagnosis not present

## 2024-04-21 DIAGNOSIS — Z170 Estrogen receptor positive status [ER+]: Secondary | ICD-10-CM | POA: Diagnosis not present

## 2024-04-21 LAB — RAD ONC ARIA SESSION SUMMARY
Course Elapsed Days: 23
Plan Fractions Treated to Date: 18
Plan Prescribed Dose Per Fraction: 1.8 Gy
Plan Total Fractions Prescribed: 28
Plan Total Prescribed Dose: 50.4 Gy
Reference Point Dosage Given to Date: 32.4 Gy
Reference Point Session Dosage Given: 1.8 Gy
Session Number: 18

## 2024-04-21 NOTE — Therapy (Signed)
 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 [place]   BREAST BIOPSY Right 09/25/2023   stereo bx, calcs, RIBBON clip-path pending   BREAST BIOPSY Left 09/25/2023   US  bx, savi tag as marker, path pending   BREAST BIOPSY Right 09/25/2023   MM RT BREAST BX W LOC DEV 1ST LESION IMAGE BX SPEC STEREO GUIDE 09/25/2023 [facility]   BREAST BIOPSY Left 09/25/2023   US  LT BREAST BX W LOC DEV 1ST LESION IMG BX SPEC US  GUIDE 09/25/2023 [facility]   BREAST EXCISIONAL BIOPSY Right 1993   benign   BREAST LUMPECTOMY Right 1991   benign   BREAST RECONSTRUCTION WITH PLACEMENT OF TISSUE EXPANDER AND FLEX HD (ACELLULAR HYDRATED DERMIS) Bilateral 12/22/2023   Procedure: BREAST RECONSTRUCTION WITH PLACEMENT OF TISSUE EXPANDER AND FLEX HD (ACELLULAR HYDRATED DERMIS);  Surgeon: Thornell Flirt, DO;  Location: [facility];  Service: Plastics;  Laterality: Bilateral;   CESAREAN SECTION   12/16/2005   MASTECTOMY W/ SENTINEL NODE BIOPSY Bilateral 12/22/2023   Procedure: MASTECTOMY WITH SENTINEL LYMPH NODE BIOPSY, simple mastectomy;  Surgeon: Barrett Lick, MD;  Location: [facility];  Service: General;  Laterality: Bilateral;   REMOVAL OF TISSUE EXPANDER AND PLACEMENT OF IMPLANT Bilateral 02/18/2024   Procedure: REMOVAL, TISSUE EXPANDER, BREAST, WITH IMPLANT INSERTION;  Surgeon: Thornell Flirt, DO;  Location: [facility];  Service: Plastics;  Laterality: Bilateral;   Patient Active Problem List   Diagnosis Date Noted   Breast cancer, right (HCC) 01/20/2024   S/P bilateral mastectomy 12/22/2023   Genetic testing 10/28/2023   Breast cancer, left (HCC) 09/30/2023   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   Annual physical exam 08/15/2023   Screening mammogram for breast cancer 08/15/2023   Screen for colon cancer 08/15/2023   Post-menopausal 08/15/2023   HLD (hyperlipidemia) 08/03/2007    PCP: Marlys Singh NP  REFERRING PROVIDER: DR Tami Falcon DIAG: Breast CA with bil lumpectomy  THERAPY DIAG:  Abnormal posture  Shoulder joint stiffness, bilateral  Rationale for Evaluation and Treatment: Rehabilitation  ONSET DATE: 09/30/23  SUBJECTIVE:  SUBJECTIVE STATEMENT: I started the exercise program about 2 weeks ago.  Doing okay.  Did not have about 2 more weeks of radiation.  Met short-term disability was extended to about the 7th or 9th of June.  I am doing like you told me the pulleys 2 times a day.  And before I come to radiation.  The left one maybe feels a little better. PERTINENT HISTORY:  Patient was diagnosed with bilateral  breast cancer -patient had bilateral mastectomy with same time reconstruction with expanders by Dr. Dillingham-12/22/2023 surgery was done.   With the last visit no expansion was done.  3 lymph nodes removed on the right and 6 lymph nodes removed from the left.  Exchange of expanders for silicone implants was done on 02/18/2024 by Dr. Orin Birk.  Radiation starting next week. PATIENT GOALS:   reduce lymphedema risk and learn post op HEP.   PAIN:  Are you having pain? No  PRECAUTIONS: Active CA     HAND DOMINANCE: left  WEIGHT BEARING RESTRICTIONS: No  FALLS:  Has patient fallen in last 6 months? 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   LEISURE: read , spend time with daughter    OBJECTIVE:  COGNITION: Overall cognitive status: Within functional limits for tasks assessed    POSTURE:  Forward head and rounded shoulders posture  UPPER EXTREMITY AROM/PROM:  A/PROM RIGHT   eval  R 01/21/24 R 01/23/24 R 01/26/24 R  02/09/24 R 03/10/24 R 03/17/24 R 03/31/24 R 04/21/24  Shoulder extension           Shoulder flexion 165 112 112 125 125 In session 150 115 160 145 165  Shoulder abduction 165 95 90 95 92 ( in session 109 105 135 140 end of session 145 160  Shoulder internal rotation           Shoulder external rotation 85 80 85 85 84 Within functional limits behind head 90 behind head 90 90    (Blank rows = not tested)  A/PROM LEFT   eval L 01/21/24 L 01/23/24 L 01/26/24 L 02/09/24 L 03/10/24 L 03/17/24 L 03/31/24 L 04/21/24  Shoulder extension           Shoulder flexion 165 108 90( end of session 100) 102 100 in session 115 102 125 125 end of session 135 135  Shoulder abduction 165 90 70( end of session 90) 85 82 in session 94 105 110 110 end of session 120 125  Shoulder internal rotation           Shoulder external rotation 85 75 80 80 72 70 70 80 85    (Blank rows = not tested)  CERVICAL AROM: All within normal limits:     UPPER EXTREMITY STRENGTH: Not tested patient is about 4 weeks postop  LYMPHEDEMA ASSESSMENTS:    LANDMARK RIGHT   01/21/24 R  02/08/26  10 cm proximal to olecranon process 28.2 28.2  Olecranon process 26.5  26.5  10 cm proximal to ulnar styloid process 22.1 at 15 cm    Just proximal to ulnar styloid process    Across hand at thumb web space    At base of 2nd digit    (Blank rows = not tested)  LANDMARK LEFT   01/21/24 L 02/09/24  10 cm proximal to olecranon process 28.5 28.7.  Olecranon process 26.5 26.6  10 cm proximal to ulnar styloid  process 22.4 at 15 cm    Just proximal to ulnar styloid process    Across hand at thumb web space    At base of 2nd digit    (Blank rows = not tested) 04/21/24 SAME as 02/09/24  WNL COMPARE TO EACH OTHER  L-DEX LYMPHEDEMA SCREENING:  SOZO NOT TO BE DONE - NOTE DONE - Pt ended up had lymph node removed on both sides. 2 on R and 6 on the L per pt      PATIENT EDUCATION:  DOne this date and review  Education details: Lymphedema signs and symptoms, and prevention and handout provided.  As well as exercises Person educated: Patient  Education method: Explanation, Demonstration, Handout Education comprehension: Patient verbalized understanding and returned demonstration 04/21/24:    Patient had on 02/18/2024 done expanders removed and silicone implant put in bilaterally.   Patient doing radiation Patient done since last visit pulleys 2 times a day with 1 session prior to radiation.  With great improvement in the left shoulder range of motion. Patient also since last time started CARE exercise program that involve cardio as well as strengthening and stretching.  Continue to have more tightness  on the left than the right. Patient to continue to do pulleys to 2 times a day 20 reps 2 to up to 2 minutes for flexion and abduction. Patient to do 1 session prior to radiation After pulleys patient can work on supine on external rotation and radiation position. Also gave her active assisted range of motion on the wall for interlocking fingers and doing  shoulder flexion. Patient can do 2-3 times a day 10 reps. Discussed with patient working on conditioning prior to returning to work.  With the possibility that she would need to work on the floor. Patient to do a light yoga session.  Including child's pose. Start walking daily Cont CARE program  And follow-up with me the week after radiation stops.     ASSESSMENT:  CLINICAL IMPRESSION: Patient had Bilateral mastectomy with immediate reconstruction and expanders placement was originally done 12/22/2023.Aaron Aas  Patient had 3 lymph nodes removed from the right and 6 on the left.  Patient had 02/18/2024 expanders removed and silicone implants placed.  Pt seen this date for 10th visit - Patient continues to be more tight in the left shoulder than the right.  Since last session when home program was changed again patient showing progress.  Right shoulder flexion /abduction about 160 to 165 degrees and left around 135 to 125  - pt to cont with using her pulleys 2- 3 times a day especially before radiation.  Followed by active assisted range of motion on the wall and external rotation in supine.   Pt started CARE program about 2 wks ago - Patient worried last visit about returning back to work.  HR told her her work or job is not available in she and he would need to go on the floor doing more physical work.  Patient reports her short-term disability was extended to about the 7th or 9th of June.  Discussed with patient to start now to add walking daily as well as doing a light yoga session.  Patient can benefit from skilled OT services to increase motion and strength in bilateral upper extremity and monitor her for lymphedema symptoms and signs as she add more activities and return to prior level of function.    Pt will benefit from skilled therapeutic intervention to improve on the following deficits: Decreased knowledge  of precautions and lymphedema education, impaired UE functional use, pain, decreased ROM,  postural dysfunction.   OT treatment/interventions: ADL/self-care home management, pt/family education, therapeutic exercise,manual therapy  REHAB POTENTIAL: Good  CLINICAL DECISION MAKING: Stable/uncomplicated  EVALUATION COMPLEXITY: Low   GOALS: Goals reviewed with patient? YES  LONG TERM GOALS: (STG=LTG)    Name Target Date Goal status  1 Pt will be able to verbalize understanding of pertinent lymphedema risk reduction practices relevant to her dx specifically related to skin care.  Baseline:  No knowledge 12 weeks ONGOING  2 Pt will be able to return demo and/or verbalize understanding of the post op HEP related to regaining shoulder ROM. Baseline:  No knowledge 12 weeks MET       4 Pt will demo she has regained full shoulder ROM and function post operatively compared to baselines.  Baseline: See objective measurements taken today. 12 wks  ONGOING     PLAN:  OT FREQUENCY/DURATION: 6 visits 12 weeks     Occupational Therapy Information for After Breast Cancer Surgery/Treatment:  Lymphedema is a swelling condition that you may be at risk for in your arm if you have lymph nodes removed from the armpit area.  After a sentinel node biopsy, the risk is approximately 5-9% and is higher after an axillary node dissection.  There is treatment available for this condition and it is not life-threatening.  Contact your physician or occupational therapist with concerns. You may begin the 4 shoulder/posture exercises (see additional sheet) when permitted by your physician (typically a week after surgery).  If you have drains, you may need to wait until those are removed before beginning range of motion exercises.  A general recommendation is to not lift your arms above shoulder height until drains are removed.  These exercises should be done to your tolerance and gently.  This is not a "no pain/no gain" type of recovery so listen to your body and stretch into the range of motion that you can  tolerate, stopping if you have pain.  If you are having immediate reconstruction, ask your plastic surgeon about doing exercises as he or she may want you to wait. We encourage you to watch the ABC (After Breast Cancer)  video. You will learn information related to lymphedema risk, prevention and treatment and additional exercises to regain mobility following surgery.  While undergoing any medical procedure or treatment, try to avoid blood pressure being taken or needle sticks from occurring on the arm on the side of cancer.   This recommendation begins after surgery and continues for the rest of your life.  This may help reduce your risk of getting lymphedema (swelling in your arm). An excellent resource for those seeking information on lymphedema is the National Lymphedema Network's web site. It can be accessed at www.lymphnet.org If you notice swelling in your hand, arm or breast at any time following surgery (even if it is many years from now), please contact your doctor or occupational therapist to discuss this.  Lymphedema can be treated at any time but it is easier for you if it is treated early on.  If you feel like your shoulder motion is not returning to normal in a reasonable amount of time, please contact your surgeon or occupational therapist.  [facility] [phone]. [address] [place], [place] [address]      Heloise Lobo, OTR/L,CLT 04/21/2024, 5:04 PM

## 2024-04-22 ENCOUNTER — Ambulatory Visit
Admission: RE | Admit: 2024-04-22 | Discharge: 2024-04-22 | Disposition: A | Discharge status: Home / Self Care | Source: Ambulatory Visit | Attending: Radiation Oncology | Admitting: Radiation Oncology

## 2024-04-22 ENCOUNTER — Other Ambulatory Visit: Payer: Self-pay

## 2024-04-22 ENCOUNTER — Encounter: Admitting: *Deleted

## 2024-04-22 DIAGNOSIS — Z170 Estrogen receptor positive status [ER+]: Secondary | ICD-10-CM | POA: Diagnosis not present

## 2024-04-22 DIAGNOSIS — C50912 Malignant neoplasm of unspecified site of left female breast: Secondary | ICD-10-CM

## 2024-04-22 DIAGNOSIS — Z510 Encounter for antineoplastic radiation therapy: Secondary | ICD-10-CM | POA: Diagnosis not present

## 2024-04-22 DIAGNOSIS — C50412 Malignant neoplasm of upper-outer quadrant of left female breast: Secondary | ICD-10-CM | POA: Diagnosis not present

## 2024-04-22 LAB — RAD ONC ARIA SESSION SUMMARY
Course Elapsed Days: 24
Plan Fractions Treated to Date: 19
Plan Prescribed Dose Per Fraction: 1.8 Gy
Plan Total Fractions Prescribed: 28
Plan Total Prescribed Dose: 50.4 Gy
Reference Point Dosage Given to Date: 34.2 Gy
Reference Point Session Dosage Given: 1.8 Gy
Session Number: 19

## 2024-04-22 NOTE — Progress Notes (Signed)
 Daily Session Note  Patient Details  Name: Angelica Dougherty MRN: [id number] Date of Birth: [birth year] Referring Provider:   [facility] Cancer Associated Rehabilitation & Exercise from 04/13/2024 in [facility]  Referring Provider Loreatha Rodney, MD       Encounter Date: 04/22/2024  Check In:  Session Check In - 04/22/24 1221       Check-In   Supervising physician immediately available to respond to emergencies See telemetry face sheet for immediately available ER MD    Location [facility]    Staff Present Sue Em RN,BSN;Margaret Best, MS, Exercise Physiologist    Virtual Visit No    Medication changes reported     No    Fall or balance concerns reported    No    Warm-up and Cool-down Performed on first and last piece of equipment    Resistance Training Performed Yes    VAD Patient? No    PAD/SET Patient? No      Pain Assessment   Currently in Pain? No/denies                Social History   Tobacco Use  Smoking Status Never   Passive exposure: Never  Smokeless Tobacco Never    Goals Met:  Independence with exercise equipment Exercise tolerated well No report of concerns or symptoms today Strength training completed today  Goals Unmet:  Not Applicable  Comments: Pt able to follow exercise prescription today without complaint.  Will continue to monitor for progression.    Dr. Firman Hughes is Medical Director for [facility].  Dr. Fuad Aleskerov is Medical Director for [facility] Pul[facility].

## 2024-04-23 ENCOUNTER — Other Ambulatory Visit: Payer: Self-pay

## 2024-04-23 ENCOUNTER — Ambulatory Visit
Admission: RE | Admit: 2024-04-23 | Discharge: 2024-04-23 | Disposition: A | Discharge status: Home / Self Care | Source: Ambulatory Visit | Attending: Radiation Oncology | Admitting: Radiation Oncology

## 2024-04-23 DIAGNOSIS — Z170 Estrogen receptor positive status [ER+]: Secondary | ICD-10-CM | POA: Diagnosis not present

## 2024-04-23 DIAGNOSIS — Z510 Encounter for antineoplastic radiation therapy: Secondary | ICD-10-CM | POA: Diagnosis not present

## 2024-04-23 DIAGNOSIS — C50412 Malignant neoplasm of upper-outer quadrant of left female breast: Secondary | ICD-10-CM | POA: Diagnosis not present

## 2024-04-23 LAB — RAD ONC ARIA SESSION SUMMARY
Course Elapsed Days: 25
Plan Fractions Treated to Date: 20
Plan Prescribed Dose Per Fraction: 1.8 Gy
Plan Total Fractions Prescribed: 28
Plan Total Prescribed Dose: 50.4 Gy
Reference Point Dosage Given to Date: 36 Gy
Reference Point Session Dosage Given: 1.8 Gy
Session Number: 20

## 2024-04-26 ENCOUNTER — Ambulatory Visit
Admission: RE | Admit: 2024-04-26 | Discharge: 2024-04-26 | Disposition: A | Discharge status: Home / Self Care | Source: Ambulatory Visit | Attending: Radiation Oncology | Admitting: Radiation Oncology

## 2024-04-26 ENCOUNTER — Other Ambulatory Visit: Payer: Self-pay

## 2024-04-26 DIAGNOSIS — C50412 Malignant neoplasm of upper-outer quadrant of left female breast: Secondary | ICD-10-CM | POA: Diagnosis not present

## 2024-04-26 DIAGNOSIS — Z510 Encounter for antineoplastic radiation therapy: Secondary | ICD-10-CM | POA: Diagnosis not present

## 2024-04-26 DIAGNOSIS — Z170 Estrogen receptor positive status [ER+]: Secondary | ICD-10-CM | POA: Diagnosis not present

## 2024-04-26 LAB — RAD ONC ARIA SESSION SUMMARY
Course Elapsed Days: 28
Plan Fractions Treated to Date: 21
Plan Prescribed Dose Per Fraction: 1.8 Gy
Plan Total Fractions Prescribed: 28
Plan Total Prescribed Dose: 50.4 Gy
Reference Point Dosage Given to Date: 37.8 Gy
Reference Point Session Dosage Given: 1.8 Gy
Session Number: 21

## 2024-04-27 ENCOUNTER — Other Ambulatory Visit: Payer: Self-pay

## 2024-04-27 ENCOUNTER — Encounter

## 2024-04-27 ENCOUNTER — Ambulatory Visit
Admission: RE | Admit: 2024-04-27 | Discharge: 2024-04-27 | Disposition: A | Discharge status: Home / Self Care | Source: Ambulatory Visit | Attending: Radiation Oncology | Admitting: Radiation Oncology

## 2024-04-27 DIAGNOSIS — C50412 Malignant neoplasm of upper-outer quadrant of left female breast: Secondary | ICD-10-CM | POA: Diagnosis not present

## 2024-04-27 DIAGNOSIS — Z170 Estrogen receptor positive status [ER+]: Secondary | ICD-10-CM | POA: Diagnosis not present

## 2024-04-27 DIAGNOSIS — C50912 Malignant neoplasm of unspecified site of left female breast: Secondary | ICD-10-CM

## 2024-04-27 DIAGNOSIS — Z510 Encounter for antineoplastic radiation therapy: Secondary | ICD-10-CM | POA: Diagnosis not present

## 2024-04-27 LAB — RAD ONC ARIA SESSION SUMMARY
Course Elapsed Days: 29
Plan Fractions Treated to Date: 22
Plan Prescribed Dose Per Fraction: 1.8 Gy
Plan Total Fractions Prescribed: 28
Plan Total Prescribed Dose: 50.4 Gy
Reference Point Dosage Given to Date: 39.6 Gy
Reference Point Session Dosage Given: 1.8 Gy
Session Number: 22

## 2024-04-27 NOTE — Progress Notes (Signed)
 Daily Session Note  Patient Details  Name: Odester Nilson MRN: [id number] Date of Birth: 08/31/1971 Referring Provider:   Gattis Kass Cancer Associated Rehabilitation & Exercise from 04/13/2024 in [facility]  Referring Provider Loreatha Rodney, MD       Encounter Date: 04/27/2024  Check In:  Session Check In - 04/27/24 1227       Check-In   Supervising physician immediately available to respond to emergencies See telemetry face sheet for immediately available ER MD    Location [facility]    Staff Present Lyell Samuel, MS, Exercise Physiologist;Ivery Nanney, BS, Exercise Physiologist    Virtual Visit No    Medication changes reported     No    Fall or balance concerns reported    No    Warm-up and Cool-down Performed on first and last piece of equipment    Resistance Training Performed Yes    VAD Patient? No    PAD/SET Patient? No      Pain Assessment   Currently in Pain? No/denies    Multiple Pain Sites No                Social History   Tobacco Use  Smoking Status Never   Passive exposure: Never  Smokeless Tobacco Never    Goals Met:  Independence with exercise equipment Exercise tolerated well No report of concerns or symptoms today  Goals Unmet:  Not Applicable  Comments: Pt able to follow exercise prescription today without complaint.  Will continue to monitor for progression.    Dr. Firman Hughes is Medical Director for [facility].  Dr. Fuad Aleskerov is Medical Director for [facility] Pulmonary Rehabilitation.

## 2024-04-28 ENCOUNTER — Ambulatory Visit
Admission: RE | Admit: 2024-04-28 | Discharge: 2024-04-28 | Disposition: A | Discharge status: Home / Self Care | Source: Ambulatory Visit | Attending: Radiation Oncology | Admitting: Radiation Oncology

## 2024-04-28 ENCOUNTER — Other Ambulatory Visit: Payer: Self-pay

## 2024-04-28 ENCOUNTER — Ambulatory Visit (INDEPENDENT_AMBULATORY_CARE_PROVIDER_SITE_OTHER): Admitting: Surgical

## 2024-04-28 DIAGNOSIS — Z9013 Acquired absence of bilateral breasts and nipples: Secondary | ICD-10-CM

## 2024-04-28 DIAGNOSIS — Z170 Estrogen receptor positive status [ER+]: Secondary | ICD-10-CM | POA: Diagnosis not present

## 2024-04-28 DIAGNOSIS — C50412 Malignant neoplasm of upper-outer quadrant of left female breast: Secondary | ICD-10-CM | POA: Diagnosis not present

## 2024-04-28 DIAGNOSIS — D0511 Intraductal carcinoma in situ of right breast: Secondary | ICD-10-CM

## 2024-04-28 DIAGNOSIS — Z510 Encounter for antineoplastic radiation therapy: Secondary | ICD-10-CM | POA: Diagnosis not present

## 2024-04-28 DIAGNOSIS — C50911 Malignant neoplasm of unspecified site of right female breast: Secondary | ICD-10-CM

## 2024-04-28 LAB — RAD ONC ARIA SESSION SUMMARY
Course Elapsed Days: 30
Plan Fractions Treated to Date: 23
Plan Prescribed Dose Per Fraction: 1.8 Gy
Plan Total Fractions Prescribed: 28
Plan Total Prescribed Dose: 50.4 Gy
Reference Point Dosage Given to Date: 41.4 Gy
Reference Point Session Dosage Given: 1.8 Gy
Session Number: 23

## 2024-04-28 NOTE — Progress Notes (Signed)
 Referring Provider Normie Becton, FNP No address on file   CC:  Chief Complaint  Patient presents with   Follow-up      Angelica Dougherty is an 53 y.o. female.  HPI: Patient is a 53 year old female here for follow-up on her bilateral breast reconstruction after bilateral mastectomies.  She most recently underwent removal of bilateral breast tissue expanders and placement of breast implants on 02/18/2024 with Dr. Orin Birk.  She had 375 [employer] smooth ultrahigh profile gel implants placed bilaterally.  She did undergo outpatient rehabilitation after bilateral mastectomy and reconstruction.  She began radiation on 03/29/2024 -she reports she has radiation until May 29.  She reports that she is working with physical therapy still, reports that she has difficulty raising her left arm, reports a lot of discomfort when raising the left arm/shoulder above 90 degrees.  She reports she is scheduled to be out of work until June 9 at this time, does have an appointment with occupational therapy on June 4.  Will reevaluate at that point to determine fit for duty.  She reports that she has reached out to her job previously and was notified of a job change where she would require the left more weight than her previous job.  She also reports that she has reached out to her job recently and was notified that she may no longer have a job.  She does report that she noticed some lesion on her right breast along with armpit area, she has not noticed if it has worsened or changed.  She does have a few extra areas noted today on exam.  Review of Systems General: No fevers or chills  Physical Exam    04/13/2024   12:53 PM 03/23/2024    1:30 PM 03/15/2024    9:47 AM  Vitals with BMI  Height 6' 0.87"    Weight 183 lbs 11 oz 185 lbs   BMI 24.32    Systolic  116 143  Diastolic  71 83  Pulse  62 76    General:  No acute distress,  Alert and oriented, Non-Toxic, Normal speech and affect Bilateral  breast: Bilateral NAC's are surgically absent, radiation skin changes noted to left breast.  Some firmness noted to the left lateral breast.  Skin lesions of right lateral breast noted, no underlying masses noted.  No active drainage noted.  No erythema or cellulitic changes.    Decreased abduction noted, able to abduct up to about 90 degrees, but has pain.  Assessment/Plan Will have patient remain out of work for 2 more weeks until evaluation with occupational therapy/PT.  She is continuing to have difficulty with raising of the left arm, likely worsening from radiation.  Encouraged patient to continue working with PT/OT for improvement in range of motion and strengthening.  Recommend she remain out of work unless able to complete light duty less than 20 pounds lifting, less than 25 pounds pushing or pulling.  Discussed with patient to please reach out to radiation/oncology as well in regards to work restrictions.  Recommend continuing to monitor right breast skin lesions, unknown etiology.  Recommend Aquaphor or mild lotion on this area to help keep skin from becoming dry.  Will reevaluate in 2 weeks for improvement or changes.  May need referral to dermatology.  Would also like oncology to evaluate at her next appointment.    I do not see any signs infection or concern on exam.  Recommend continuing to work with physical therapy and Occupational  Therapy for improvement in function.  We did complete fit for duty forms for her today, recommend out of work unless accommodations can be made.  Janalyn Me Jaylissa Felty 04/28/2024, 10:02 AM

## 2024-04-29 ENCOUNTER — Encounter: Payer: Self-pay | Admitting: General Surgery

## 2024-04-29 ENCOUNTER — Ambulatory Visit
Admission: RE | Admit: 2024-04-29 | Discharge: 2024-04-29 | Disposition: A | Discharge status: Home / Self Care | Source: Ambulatory Visit | Attending: Radiation Oncology | Admitting: Radiation Oncology

## 2024-04-29 ENCOUNTER — Other Ambulatory Visit: Payer: Self-pay

## 2024-04-29 ENCOUNTER — Encounter

## 2024-04-29 ENCOUNTER — Ambulatory Visit (INDEPENDENT_AMBULATORY_CARE_PROVIDER_SITE_OTHER): Admitting: General Surgery

## 2024-04-29 VITALS — BP 135/75 | HR 91 | Temp 98.2°F | Ht 73.0 in | Wt 184.4 lb

## 2024-04-29 DIAGNOSIS — Z08 Encounter for follow-up examination after completed treatment for malignant neoplasm: Secondary | ICD-10-CM | POA: Diagnosis not present

## 2024-04-29 DIAGNOSIS — Z170 Estrogen receptor positive status [ER+]: Secondary | ICD-10-CM | POA: Diagnosis not present

## 2024-04-29 DIAGNOSIS — C50412 Malignant neoplasm of upper-outer quadrant of left female breast: Secondary | ICD-10-CM

## 2024-04-29 DIAGNOSIS — Z510 Encounter for antineoplastic radiation therapy: Secondary | ICD-10-CM | POA: Diagnosis not present

## 2024-04-29 LAB — RAD ONC ARIA SESSION SUMMARY
Course Elapsed Days: 31
Plan Fractions Treated to Date: 24
Plan Prescribed Dose Per Fraction: 1.8 Gy
Plan Total Fractions Prescribed: 28
Plan Total Prescribed Dose: 50.4 Gy
Reference Point Dosage Given to Date: 43.2 Gy
Reference Point Session Dosage Given: 1.8 Gy
Session Number: 24

## 2024-04-29 NOTE — Progress Notes (Signed)
 Outpatient Surgical Follow Up  04/29/2024  Angelica Dougherty is an 53 y.o. female.   Chief Complaint  Patient presents with   Follow-up    Bilateral mastectomy    HPI: The patient returns today status post bilateral mastectomies with reconstruction by our plastic surgery team.  She has undergone implant replacement of her tissue expanders.  Due to abutment of the chest wall she had radiation therapy.  She reports doing well.  She has her last radiation week next week.  She says that she does have some pain with abducting her left arm above her head but attributes this to radiation.  She says that there are some radiation changes to the left breast.  She denies any noticing any lumps in her breast or chest wall or under her armpits.  She does report that on her right breast there is some scaling of the skin.  She denies any pain at these areas.  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 [facility]   BREAST BIOPSY Right 09/25/2023   stereo bx, calcs, RIBBON clip-path pending   BREAST BIOPSY Left 09/25/2023   US  bx, savi tag as marker, path pending   BREAST BIOPSY Right 09/25/2023   MM RT BREAST BX W LOC DEV 1ST LESION IMAGE BX SPEC STEREO GUIDE 09/25/2023 [facility]   BREAST BIOPSY Left 09/25/2023   US  LT BREAST BX W LOC DEV 1ST LESION IMG BX SPEC US  GUIDE 09/25/2023 [facility]   BREAST EXCISIONAL BIOPSY Right 1993   benign   BREAST LUMPECTOMY Right 1991   benign   BREAST RECONSTRUCTION WITH  PLACEMENT OF TISSUE EXPANDER AND FLEX HD (ACELLULAR HYDRATED DERMIS) Bilateral 12/22/2023   Procedure: BREAST RECONSTRUCTION WITH PLACEMENT OF TISSUE EXPANDER AND FLEX HD (ACELLULAR HYDRATED DERMIS);  Surgeon: Thornell Flirt, DO;  Location: [facility];  Service: Plastics;  Laterality: Bilateral;   CESAREAN SECTION  12/16/2005   MASTECTOMY W/ SENTINEL NODE BIOPSY Bilateral 12/22/2023   Procedure: MASTECTOMY WITH SENTINEL LYMPH NODE BIOPSY, simple mastectomy;  Surgeon: Barrett Lick, MD;  Location: [facility];  Service: General;  Laterality: Bilateral;   REMOVAL OF TISSUE EXPANDER AND PLACEMENT OF IMPLANT Bilateral 02/18/2024   Procedure: REMOVAL, TISSUE EXPANDER, BREAST, WITH IMPLANT INSERTION;  Surgeon: Thornell Flirt, DO;  Location: [facility];  Service: Plastics;  Laterality: Bilateral;    Family History  Problem Relation Age of Onset   Asthma Mother    Cancer Maternal Uncle        back cancer at 73   Breast cancer Paternal Aunt        dx 30s, bilateral   Breast cancer Paternal Aunt        dx 77s   Breast cancer Cousin        dx 50s    Social History:  reports that she has never smoked. She has never been exposed to tobacco smoke. She has never used smokeless tobacco. She reports that she does not drink alcohol and does not use  drugs.  Allergies:  Allergies  Allergen Reactions   Codeine Other (See Comments)    Swelling    Medications reviewed.    ROS Full ROS performed and is otherwise negative other than what is stated in HPI   BP 135/75   Pulse 91   Temp 98.2 F (36.8 C) (Oral)   Ht 6\' 1"  (1.854 m)   Wt 184 lb 6.4 oz (83.6 kg)   LMP 06/08/2006 (Approximate) Comment: Hysterectomy  SpO2 95%   BMI 24.33 kg/m   Physical Exam Breast exam performed in the presence of a chaperone.  On her right breast there are areas of scaling with minor hyperpigmentation on the lateral side of her right breast both above and below her scar line.  She does have  some radiation changes to the left breast with some weakness on abducting her left arm secondary to pain.  There are no dominant masses in the axillas or over the breast implants.    No results found for this or any previous visit (from the past 48 hours). No results found.  Assessment/Plan:  Overall patient is doing well.  She is finishing up radiation therapy next week.  She does have some scaling of the right breast.  We will watch this.  I recommended that she can continue some moisturizing cream over these areas.  If they expand or do not go away she will call me and we can see her again and she possibly may need dermatologic referral.  She is going to follow-up with Dr. Wilhelmenia Harada oncology.  We will plan to see her 1 year after surgery in January 2026.  She knows that if there are any problems she can give us  a call.   Severa Daniels, M.D. [facility]

## 2024-04-29 NOTE — Patient Instructions (Signed)
 Total or Modified Radical Mastectomy, Care After The following information offers guidance on how to care for yourself after your procedure. Your health care provider may also give you more specific instructions. If you have problems or questions, contact your health care provider. What can I expect after the procedure? After the procedure, it is common to have: Pain, soreness, or tenderness. Numbness. Swelling. Neuropathic (nerve) pain in the chest wall, armpit, or arm. Stiffness in the arm or shoulder. Feelings of stress, sadness, or depression. If the lymph nodes under your arm were removed, you may have arm swelling, weakness, or numbness on the same side of your body as your surgery. Follow these instructions at home: Incision care  Follow instructions from your health care provider about how to take care of your incision. Make sure you: Wash your hands with soap and water for at least 20 seconds before you change your bandage (dressing). If soap and water are not available, use hand sanitizer. Change your dressing as told by your health care provider. Leave stitches (sutures), skin glue, or adhesive strips in place. These skin closures may need to stay in place for 2 weeks or longer. If adhesive strip edges start to loosen and curl up, you may trim the loose edges. Do not remove adhesive strips completely unless your health care provider tells you to do that. Check your incision area every day for signs of infection. Check for: Redness, swelling, or more pain. Fluid or blood. Warmth. Pus or a bad smell. If you were sent home with a surgical drain in place, follow instructions from your health care provider. This may include emptying the drain and keeping track of the amount of drainage. Bathing Do not take baths, swim, or use a hot tub until your health care provider approves. Ask your health care provider if you may take showers. You may only be allowed to take sponge  baths. Activity  Return to your normal activities as told by your health care provider. Ask your health care provider what activities are safe for you. It may take several weeks to return to full activity. Avoid activities that take a lot of effort. Rest as told by your health care provider. Ask friends and family to help with chores, including child care, meal preparation, laundry, and shopping. Be careful to avoid any activities that could cause an injury to your arm on the side of your surgery. Do not lift anything that is heavier than 10 lb (4.5 kg), or the limit that you are told, until your health care provider says that it is safe. Avoid lifting with the arm on the side of your surgery. Do not carry heavy objects on your shoulder. After your drain is removed, do exercises to prevent stiffness and swelling in your arm. Talk with your health care provider about which exercises are safe for you. General instructions Take over-the-counter and prescription medicines only as told by your health care provider. You may eat what you usually do. Keep your arm raised (elevated) above the level of your heart when you are sitting or lying down. Do not wear tight jewelry on your arm, wrist, or fingers on the side of your surgery. You may be given a tight sleeve (compression bandage) to wear over your arm on the side of your surgery. Wear this sleeve as told by your health care provider. Ask your health care provider when you can start wearing a bra or using a breast prosthesis. Before you are involved in  certain procedures, such as giving blood or having your blood pressure checked, tell all of your health care providers if lymph nodes under your arm were removed. This is important information. Follow-up Keep all follow-up visits. This is important. Get checked for extra fluid around your lymph nodes (lymphedema) as often as told by your health care provider. Medicines Take over-the-counter and  prescription medicines as told by your health care provider. Take your antibiotic medicine as told by your health care provider. Do not stop taking the antibiotic even if you start to feel better. Ask your health care provider if the medicine prescribed to you: Requires you to avoid driving or using machinery. Can cause constipation. You may need to take these actions to prevent or treat constipation: Drink enough fluid to keep your urine pale yellow. Take over-the-counter or prescription medicines. Eat foods that are high in fiber, such as beans, whole grains, and fresh fruits and vegetables. Limit foods that are high in fat and processed sugars, such as fried or sweet foods. Lifestyle Do not use any products that contain nicotine or tobacco before the procedure. These products include cigarettes, chewing tobacco, and vaping devices, such as e-cigarettes. These can delay incision healing. If you need help quitting, ask your health care provider. Contact a health care provider if: You have chills or a fever. Your pain medicine is not working. Your arm swelling, weakness, or numbness has not improved after a few weeks. You have new swelling in your breast area or arm. You have redness, swelling, or more pain in your incision area. You have fluid or blood coming from your incision. Your incision feels warm to the touch. You have pus or a bad smell coming from your incision. Get help right away if: You have very bad pain in your breast area or arm. You have a sudden onset of chest pain. You have a fast heart rate. You have difficulty breathing. These symptoms may be an emergency. Get help right away. Call 911. Do not wait to see if the symptoms will go away. Do not drive yourself to the hospital. Summary Follow instructions from your health care provider about how to take care of your incision. Check your incision area every day for signs of infection. Ask your health care provider what  activities are safe for you. Keep all follow-up visits. This is important. Contact a health care provider if you have new swelling in your breast area or arm. This information is not intended to replace advice given to you by your health care provider. Make sure you discuss any questions you have with your health care provider. Document Revised: 08/26/2021 Document Reviewed: 08/26/2021 Elsevier Patient Education  2024 [employer].

## 2024-04-30 ENCOUNTER — Other Ambulatory Visit: Payer: Self-pay

## 2024-04-30 ENCOUNTER — Ambulatory Visit
Admission: RE | Admit: 2024-04-30 | Discharge: 2024-04-30 | Disposition: A | Discharge status: Home / Self Care | Source: Ambulatory Visit | Attending: Radiation Oncology | Admitting: Radiation Oncology

## 2024-04-30 ENCOUNTER — Inpatient Hospital Stay

## 2024-04-30 DIAGNOSIS — C50412 Malignant neoplasm of upper-outer quadrant of left female breast: Secondary | ICD-10-CM | POA: Diagnosis not present

## 2024-04-30 DIAGNOSIS — C50912 Malignant neoplasm of unspecified site of left female breast: Secondary | ICD-10-CM

## 2024-04-30 DIAGNOSIS — D0511 Intraductal carcinoma in situ of right breast: Secondary | ICD-10-CM

## 2024-04-30 DIAGNOSIS — Z510 Encounter for antineoplastic radiation therapy: Secondary | ICD-10-CM | POA: Diagnosis not present

## 2024-04-30 DIAGNOSIS — Z170 Estrogen receptor positive status [ER+]: Secondary | ICD-10-CM | POA: Diagnosis not present

## 2024-04-30 LAB — RAD ONC ARIA SESSION SUMMARY
Course Elapsed Days: 32
Plan Fractions Treated to Date: 25
Plan Prescribed Dose Per Fraction: 1.8 Gy
Plan Total Fractions Prescribed: 28
Plan Total Prescribed Dose: 50.4 Gy
Reference Point Dosage Given to Date: 45 Gy
Reference Point Session Dosage Given: 1.8 Gy
Session Number: 25

## 2024-04-30 LAB — CBC WITH DIFFERENTIAL (CANCER CENTER ONLY)
Abs Immature Granulocytes: 0.01 10*3/uL (ref 0.00–0.07)
Basophils Absolute: 0 10*3/uL (ref 0.0–0.1)
Basophils Relative: 0 %
Eosinophils Absolute: 0.1 10*3/uL (ref 0.0–0.5)
Eosinophils Relative: 2 %
HCT: 39.1 % (ref 36.0–46.0)
Hemoglobin: 13.2 g/dL (ref 12.0–15.0)
Immature Granulocytes: 0 %
Lymphocytes Relative: 22 %
Lymphs Abs: 1 10*3/uL (ref 0.7–4.0)
MCH: 30.7 pg (ref 26.0–34.0)
MCHC: 33.8 g/dL (ref 30.0–36.0)
MCV: 90.9 fL (ref 80.0–100.0)
Monocytes Absolute: 0.4 10*3/uL (ref 0.1–1.0)
Monocytes Relative: 9 %
Neutro Abs: 3.1 10*3/uL (ref 1.7–7.7)
Neutrophils Relative %: 67 %
Platelet Count: 271 10*3/uL (ref 150–400)
RBC: 4.3 MIL/uL (ref 3.87–5.11)
RDW: 13.2 % (ref 11.5–15.5)
WBC Count: 4.7 10*3/uL (ref 4.0–10.5)
nRBC: 0 % (ref 0.0–0.2)

## 2024-04-30 LAB — CMP (CANCER CENTER ONLY)
ALT: 17 U/L (ref 0–44)
AST: 21 U/L (ref 15–41)
Albumin: 3.8 g/dL (ref 3.5–5.0)
Alkaline Phosphatase: 51 U/L (ref 38–126)
Anion gap: 8 (ref 5–15)
BUN: 11 mg/dL (ref 6–20)
CO2: 25 mmol/L (ref 22–32)
Calcium: 9.4 mg/dL (ref 8.9–10.3)
Chloride: 105 mmol/L (ref 98–111)
Creatinine: 0.82 mg/dL (ref 0.44–1.00)
GFR, Estimated: >60 mL/min (ref >60)
Glucose, Bld: 94 mg/dL (ref 70–99)
Potassium: 4.4 mmol/L (ref 3.5–5.1)
Sodium: 138 mmol/L (ref 135–145)
Total Bilirubin: 0.9 mg/dL (ref 0.0–1.2)
Total Protein: 7.5 g/dL (ref 6.5–8.1)

## 2024-05-02 LAB — FSH/LH
FSH: 62.5 m[IU]/mL
LH: 26.7 m[IU]/mL

## 2024-05-02 LAB — ESTRADIOL: Estradiol: 5.8 pg/mL

## 2024-05-04 ENCOUNTER — Ambulatory Visit
Admission: RE | Admit: 2024-05-04 | Discharge: 2024-05-04 | Disposition: A | Discharge status: Home / Self Care | Source: Ambulatory Visit | Attending: Radiation Oncology | Admitting: Radiation Oncology

## 2024-05-04 ENCOUNTER — Encounter

## 2024-05-04 ENCOUNTER — Other Ambulatory Visit: Payer: Self-pay

## 2024-05-04 DIAGNOSIS — Z170 Estrogen receptor positive status [ER+]: Secondary | ICD-10-CM | POA: Diagnosis not present

## 2024-05-04 DIAGNOSIS — C50912 Malignant neoplasm of unspecified site of left female breast: Secondary | ICD-10-CM

## 2024-05-04 DIAGNOSIS — C50412 Malignant neoplasm of upper-outer quadrant of left female breast: Secondary | ICD-10-CM | POA: Diagnosis not present

## 2024-05-04 DIAGNOSIS — Z510 Encounter for antineoplastic radiation therapy: Secondary | ICD-10-CM | POA: Diagnosis not present

## 2024-05-04 LAB — RAD ONC ARIA SESSION SUMMARY
Course Elapsed Days: 36
Plan Fractions Treated to Date: 26
Plan Prescribed Dose Per Fraction: 1.8 Gy
Plan Total Fractions Prescribed: 28
Plan Total Prescribed Dose: 50.4 Gy
Reference Point Dosage Given to Date: 46.8 Gy
Reference Point Session Dosage Given: 1.8 Gy
Session Number: 26

## 2024-05-04 NOTE — Progress Notes (Signed)
 Daily Session Note  Patient Details  Name: Angelica Dougherty MRN: [id number] Date of Birth: 02-27-1971 Referring Provider:   Gattis Kass Cancer Associated Rehabilitation & Exercise from 04/13/2024 in [facility]  Referring Provider Loreatha Rodney, MD       Encounter Date: 05/04/2024  Check In:  Session Check In - 05/04/24 1237       Check-In   Supervising physician immediately available to respond to emergencies See telemetry face sheet for immediately available ER MD    Location [facility]    Staff Present Lyell Samuel, MS, Exercise Physiologist    Virtual Visit No    Medication changes reported     No    Fall or balance concerns reported    No    Warm-up and Cool-down Performed on first and last piece of equipment    Resistance Training Performed Yes    VAD Patient? No    PAD/SET Patient? No      Pain Assessment   Currently in Pain? No/denies    Multiple Pain Sites No                Social History   Tobacco Use  Smoking Status Never   Passive exposure: Never  Smokeless Tobacco Never    Goals Met:  Independence with exercise equipment Exercise tolerated well No report of concerns or symptoms today  Goals Unmet:  Not Applicable  Comments: Pt able to follow exercise prescription today without complaint.  Will continue to monitor for progression.    Dr. Firman Hughes is Medical Director for [facility] Cardiac Rehabilitation.  Dr. Fuad Aleskerov is Medical Director for [facility] Pulmonary Rehabilitation.

## 2024-05-05 ENCOUNTER — Other Ambulatory Visit: Payer: Self-pay

## 2024-05-05 ENCOUNTER — Ambulatory Visit
Admission: RE | Admit: 2024-05-05 | Discharge: 2024-05-05 | Disposition: A | Discharge status: Home / Self Care | Source: Ambulatory Visit | Attending: Internal Medicine | Admitting: Internal Medicine

## 2024-05-05 ENCOUNTER — Ambulatory Visit
Admission: RE | Admit: 2024-05-05 | Discharge: 2024-05-05 | Disposition: A | Discharge status: Home / Self Care | Source: Ambulatory Visit | Attending: Radiation Oncology | Admitting: Radiation Oncology

## 2024-05-05 DIAGNOSIS — C50412 Malignant neoplasm of upper-outer quadrant of left female breast: Secondary | ICD-10-CM | POA: Diagnosis not present

## 2024-05-05 DIAGNOSIS — Z170 Estrogen receptor positive status [ER+]: Secondary | ICD-10-CM | POA: Diagnosis not present

## 2024-05-05 DIAGNOSIS — Z780 Asymptomatic menopausal state: Secondary | ICD-10-CM | POA: Diagnosis not present

## 2024-05-05 DIAGNOSIS — C50912 Malignant neoplasm of unspecified site of left female breast: Secondary | ICD-10-CM | POA: Diagnosis not present

## 2024-05-05 DIAGNOSIS — D0511 Intraductal carcinoma in situ of right breast: Secondary | ICD-10-CM | POA: Insufficient documentation

## 2024-05-05 DIAGNOSIS — Z510 Encounter for antineoplastic radiation therapy: Secondary | ICD-10-CM | POA: Diagnosis not present

## 2024-05-05 LAB — RAD ONC ARIA SESSION SUMMARY
Course Elapsed Days: 37
Plan Fractions Treated to Date: 27
Plan Prescribed Dose Per Fraction: 1.8 Gy
Plan Total Fractions Prescribed: 28
Plan Total Prescribed Dose: 50.4 Gy
Reference Point Dosage Given to Date: 48.6 Gy
Reference Point Session Dosage Given: 1.8 Gy
Session Number: 27

## 2024-05-06 ENCOUNTER — Other Ambulatory Visit: Payer: Self-pay

## 2024-05-06 ENCOUNTER — Encounter

## 2024-05-06 ENCOUNTER — Encounter: Payer: Self-pay | Admitting: *Deleted

## 2024-05-06 ENCOUNTER — Ambulatory Visit
Admission: RE | Admit: 2024-05-06 | Discharge: 2024-05-06 | Disposition: A | Discharge status: Home / Self Care | Source: Ambulatory Visit | Attending: Radiation Oncology | Admitting: Radiation Oncology

## 2024-05-06 DIAGNOSIS — C50912 Malignant neoplasm of unspecified site of left female breast: Secondary | ICD-10-CM

## 2024-05-06 DIAGNOSIS — Z170 Estrogen receptor positive status [ER+]: Secondary | ICD-10-CM | POA: Diagnosis not present

## 2024-05-06 DIAGNOSIS — Z510 Encounter for antineoplastic radiation therapy: Secondary | ICD-10-CM | POA: Diagnosis not present

## 2024-05-06 DIAGNOSIS — C50412 Malignant neoplasm of upper-outer quadrant of left female breast: Secondary | ICD-10-CM | POA: Diagnosis not present

## 2024-05-06 LAB — RAD ONC ARIA SESSION SUMMARY
Course Elapsed Days: 38
Plan Fractions Treated to Date: 28
Plan Prescribed Dose Per Fraction: 1.8 Gy
Plan Total Fractions Prescribed: 28
Plan Total Prescribed Dose: 50.4 Gy
Reference Point Dosage Given to Date: 50.4 Gy
Reference Point Session Dosage Given: 1.8 Gy
Session Number: 28

## 2024-05-06 NOTE — Progress Notes (Signed)
 Daily Session Note  Patient Details  Name: Angelica Dougherty MRN: [id number] Date of Birth: 1971-11-02 Referring Provider:   Gattis Kass Cancer Associated Rehabilitation & Exercise from 04/13/2024 in [facility]monary Re[facility]  Referring Provider Loreatha Rodney, MD       Encounter Date: 05/06/2024  Check In:  Session Check In - 05/06/24 1248       Check-In   Supervising physician immediately available to respond to emergencies See telemetry face sheet for immediately available ER MD    Location [facility]    Staff Present Lyell Samuel, MS, Exercise Physiologist;Maxon Conetta BS, Exercise Physiologist    Virtual Visit No    Medication changes reported     No    Fall or balance concerns reported    No    Warm-up and Cool-down Performed on first and last piece of equipment    Resistance Training Performed Yes    VAD Patient? No    PAD/SET Patient? No      Pain Assessment   Currently in Pain? No/denies    Multiple Pain Sites No               Exercise Prescription Changes - 05/06/24 1200       Home Exercise Plan   Plans to continue exercise at Home (comment)   Walking outside, weights for resistance, might look into local gym/wellzone for indoor aerobic exercise option   Frequency Add 1 additional day to program exercise sessions.    Initial Home Exercises Provided 05/06/24             Social History   Tobacco Use  Smoking Status Never   Passive exposure: Never  Smokeless Tobacco Never    Goals Met:  Independence with exercise equipment Exercise tolerated well No report of concerns or symptoms today  Goals Unmet:  Not Applicable  Comments: Pt able to follow exercise prescription today without complaint.  Will continue to monitor for progression.    Dr. Firman Hughes is Medical Director for [facility] Cardiac Rehabilitation.  Dr. Fuad Aleskerov is Medical Director for [facility] Pulmonary Rehabilitation.

## 2024-05-07 NOTE — Radiation Completion Notes (Signed)
 Patient Name: Angelica Dougherty, Angelica Dougherty MRN: [id number] Date of Birth: 23-Sep-1971 Referring Physician: ELISE PAYNE, M.D. Date of Service: 2024-05-07 Radiation Oncologist: Glenis Langdon, M.D. 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  She had undergone left mastectomy with multifocal disease but close margins and 1 sentinel lymph node with isolated tumor cells.  At time I recommended postoperative radiation therapy to her left chest wall and peripheral lymphatics.  Her Oncotype DX came back at 25 she is discussed that with medical oncology she will not have systemic treatment.  She is completed bilateral breast reconstruction by Dr. Orin Birk.  She has had her expanders removed and implants placed.  She has had an excellent cosmetic result with bilateral breast reconstruction.  Patient is not interested in nipple areolar reconstruction at this time.  She is doing well she states her only limitation is moving her left arm in an upward position.  She is working with physical therapy on that.      ==========DELIVERED PLANS==========  First Treatment Date: 2024-03-29 Last Treatment Date: 2024-05-06   Plan Name: CW_L Site: Chest Wall, Left Technique: 3D Mode: Photon Dose Per Fraction: 1.8 Gy Prescribed Dose (Delivered / Prescribed): 50.4 Gy / 50.4 Gy Prescribed Fxs (Delivered / Prescribed): 28 / 28     ==========ON TREATMENT VISIT DATES========== 2024-03-30, 2024-04-06,  2024-04-13, 2024-04-20, 2024-04-27, 2024-05-04, 2024-05-04     ==========UPCOMING VISITS==========       ==========APPENDIX - ON TREATMENT VISIT NOTES==========   See weekly On Treatment Notes in Epic for details in the Media tab (listed as Progress notes on the On Treatment Visit Dates listed above).

## 2024-05-11 ENCOUNTER — Encounter: Attending: Internal Medicine

## 2024-05-11 DIAGNOSIS — Z170 Estrogen receptor positive status [ER+]: Secondary | ICD-10-CM | POA: Insufficient documentation

## 2024-05-11 DIAGNOSIS — C50912 Malignant neoplasm of unspecified site of left female breast: Secondary | ICD-10-CM | POA: Insufficient documentation

## 2024-05-11 NOTE — Progress Notes (Signed)
 Daily Session Note  Patient Details  Name: Angelica Dougherty MRN: [id number] Date of Birth: 09-Jun-1971 Referring Provider:   Gattis Kass Cancer Associated Rehabilitation & Exercise from 04/13/2024 in [facility] Card[facility] and Pulmonary Rehab  Referring Provider Loreatha Rodney, MD       Encounter Date: 05/11/2024  Check In:  Session Check In - 05/11/24 1233       Check-In   Supervising physician immediately available to respond to emergencies See telemetry face sheet for immediately available ER MD    Location [facility]    Staff Present Lyell Samuel, MS, Exercise Physiologist;Lakoda Raske, BS, Exercise Physiologist    Virtual Visit No    Medication changes reported     No    Fall or balance concerns reported    No    Warm-up and Cool-down Performed on first and last piece of equipment    Resistance Training Performed Yes    VAD Patient? No    PAD/SET Patient? No      Pain Assessment   Currently in Pain? No/denies    Multiple Pain Sites No                Social History   Tobacco Use  Smoking Status Never   Passive exposure: Never  Smokeless Tobacco Never    Goals Met:  Independence with exercise equipment Exercise tolerated well No report of concerns or symptoms today  Goals Unmet:  Not Applicable  Comments: Pt able to follow exercise prescription today without complaint.  Will continue to monitor for progression.   Dr. Firman Hughes is Medical Director for [facility] Cardiac Rehabilitation.  Dr. Fuad Aleskerov is Medical Director for [facility] Pulmonary Rehabilitation.

## 2024-05-12 ENCOUNTER — Inpatient Hospital Stay: Attending: Oncology | Admitting: Occupational Therapy

## 2024-05-12 DIAGNOSIS — R293 Abnormal posture: Secondary | ICD-10-CM | POA: Insufficient documentation

## 2024-05-12 DIAGNOSIS — Z803 Family history of malignant neoplasm of breast: Secondary | ICD-10-CM | POA: Diagnosis not present

## 2024-05-12 DIAGNOSIS — Z170 Estrogen receptor positive status [ER+]: Secondary | ICD-10-CM | POA: Diagnosis not present

## 2024-05-12 DIAGNOSIS — Z1732 Human epidermal growth factor receptor 2 negative status: Secondary | ICD-10-CM | POA: Diagnosis not present

## 2024-05-12 DIAGNOSIS — M25612 Stiffness of left shoulder, not elsewhere classified: Secondary | ICD-10-CM | POA: Insufficient documentation

## 2024-05-12 DIAGNOSIS — Z79811 Long term (current) use of aromatase inhibitors: Secondary | ICD-10-CM | POA: Diagnosis not present

## 2024-05-12 DIAGNOSIS — M25611 Stiffness of right shoulder, not elsewhere classified: Secondary | ICD-10-CM | POA: Diagnosis not present

## 2024-05-12 DIAGNOSIS — Z9013 Acquired absence of bilateral breasts and nipples: Secondary | ICD-10-CM | POA: Diagnosis not present

## 2024-05-12 DIAGNOSIS — C50812 Malignant neoplasm of overlapping sites of left female breast: Secondary | ICD-10-CM | POA: Diagnosis not present

## 2024-05-12 DIAGNOSIS — Z1721 Progesterone receptor positive status: Secondary | ICD-10-CM | POA: Insufficient documentation

## 2024-05-12 DIAGNOSIS — Z780 Asymptomatic menopausal state: Secondary | ICD-10-CM | POA: Insufficient documentation

## 2024-05-12 NOTE — Therapy (Signed)
 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 [facility]   BREAST BIOPSY Right 09/25/2023   stereo bx, calcs, RIBBON clip-path pending   BREAST BIOPSY Left 09/25/2023   US  bx, savi tag as marker, path pending   BREAST BIOPSY Right 09/25/2023   MM RT BREAST BX W LOC DEV 1ST LESION IMAGE BX SPEC STEREO GUIDE 09/25/2023 [facility]   BREAST BIOPSY Left 09/25/2023   US  LT BREAST BX W LOC DEV 1ST LESION IMG BX SPEC US  GUIDE 09/25/2023 [facility]   BREAST EXCISIONAL BIOPSY Right 1993   benign   BREAST LUMPECTOMY Right 1991   benign   BREAST RECONSTRUCTION WITH PLACEMENT OF TISSUE EXPANDER AND FLEX HD (ACELLULAR HYDRATED DERMIS) Bilateral 12/22/2023   Procedure: BREAST RECONSTRUCTION WITH PLACEMENT OF TISSUE EXPANDER AND FLEX HD (ACELLULAR HYDRATED DERMIS);  Surgeon: Thornell Flirt, DO;  Location: [facility];  Service: Plastics;  Laterality: Bilateral;   CESAREAN SECTION  12/16/2005   MASTECTOMY W/ SENTINEL NODE BIOPSY Bilateral 12/22/2023   Procedure: MASTECTOMY WITH SENTINEL LYMPH NODE BIOPSY, simple  mastectomy;  Surgeon: Barrett Lick, MD;  Location: [facility];  Service: General;  Laterality: Bilateral;   REMOVAL OF TISSUE EXPANDER AND PLACEMENT OF IMPLANT Bilateral 02/18/2024   Procedure: REMOVAL, TISSUE EXPANDER, BREAST, WITH IMPLANT INSERTION;  Surgeon: Thornell Flirt, DO;  Location: [facility];  Service: Plastics;  Laterality: Bilateral;   Patient Active Problem List   Diagnosis Date Noted   Breast cancer, right (HCC) 01/20/2024   S/P bilateral mastectomy 12/22/2023   Genetic testing 10/28/2023   Breast cancer, left (HCC) 09/30/2023   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   Annual physical exam 08/15/2023   Screening mammogram for breast cancer 08/15/2023   Screen for colon cancer 08/15/2023   Post-menopausal 08/15/2023   HLD (hyperlipidemia) 08/03/2007    PCP: Marlys Singh NP  REFERRING PROVIDER: DR Tami Falcon DIAG: Breast CA with bil lumpectomy  THERAPY DIAG:  Abnormal posture  Shoulder joint stiffness, bilateral  Rationale for Evaluation and Treatment: Rehabilitation  ONSET DATE: 09/30/23  SUBJECTIVE:  SUBJECTIVE STATEMENT: With the radiation my left arm but just a little raw.  Some not doing as great with my motion on my left arm.  My right mind is doing okay.  I am doing the cancer exercise classes.  Feeling little bit better stronger, not getting as tired.  I am seeing the surgeon tomorrow.  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  PERTINENT HISTORY:  Patient was diagnosed with bilateral  breast cancer -patient had bilateral mastectomy with same time reconstruction with expanders by Dr. Dillingham-12/22/2023 surgery was done.  With the last visit no  expansion was done.  3 lymph nodes removed on the right and 6 lymph nodes removed from the left.  Exchange of expanders for silicone implants was done on 02/18/2024 by Dr. Orin Birk.  Radiation starting next week. PATIENT GOALS:   reduce lymphedema risk and learn post op HEP.   PAIN:  Are you having pain?  Left axilla pain with shoulder motion more than 90 degrees  PRECAUTIONS: Active CA     HAND DOMINANCE: left  WEIGHT BEARING RESTRICTIONS: No  FALLS:  Has patient fallen in last 6 months? 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   LEISURE: read , spend time with daughter    OBJECTIVE:  COGNITION: Overall cognitive status: Within functional limits for tasks assessed    POSTURE:  Forward head and rounded shoulders posture  UPPER EXTREMITY AROM/PROM:  A/PROM RIGHT   eval  R 01/21/24 R 01/23/24 R 01/26/24 R  02/09/24 R 03/10/24 R 03/17/24 R 03/31/24 R 04/21/24 R 05/12/24  Shoulder extension            Shoulder flexion 165 112 112 125 125 In session 150 115 160 145 165 170  Shoulder abduction 165 95 90 95 92 ( in session 109 105 135 140 end of session 145 160 160  Shoulder internal rotation            Shoulder external rotation 85 80 85 85 84 Within functional limits behind head 90 behind head 90 90 90    (Blank rows = not tested)  A/PROM LEFT   eval L 01/21/24 L 01/23/24 L 01/26/24 L 02/09/24 L 03/10/24 L 03/17/24 L 03/31/24 L 04/21/24 L 05/12/24  Shoulder extension            Shoulder flexion 165 108 90( end of session 100) 102 100 in session 115 102 125 125 end of session 135 135 135  Shoulder abduction 165 90 70( end of session 90) 85 82 in session 94 105 110 110 end of session 120 125 100  Shoulder internal rotation            Shoulder external rotation 85 75 80 80 72 70 70 80 85 85     (Blank rows = not tested)  CERVICAL AROM: All within normal limits:     UPPER EXTREMITY  STRENGTH: R in range 5/5 , L 4-/5  LYMPHEDEMA ASSESSMENTS:   LANDMARK RIGHT   01/21/24 R  02/08/26 R 05/12/24  10 cm proximal to olecranon process 28.2 28.2 28.2  Olecranon process 26.5  26.5 26.5  10 cm proximal to ulnar styloid process 22.1 at 15 cm     Just proximal to ulnar styloid process  Across hand at thumb web space     At base of 2nd digit     (Blank rows = not tested)  LANDMARK LEFT   01/21/24 L 02/09/24 L 05/12/24  10 cm proximal to olecranon process 28.5 28.7. 28.7  Olecranon process 26.5 26.6 26.6  10 cm proximal to ulnar styloid process 22.4 at 15 cm     Just proximal to ulnar styloid process     Across hand at thumb web space     At base of 2nd digit     (Blank rows = not tested) 04/21/24 SAME as 02/09/24  WNL COMPARE TO EACH OTHER  L-DEX LYMPHEDEMA SCREENING:  SOZO NOT TO BE DONE - NOTE DONE - Pt ended up had lymph node removed on both sides. 2 on R and 6 on the L per pt      PATIENT EDUCATION:  DOne this date and review  Education details: Lymphedema signs and symptoms, and prevention and handout provided.  As well as exercises Person educated: Patient  Education method: Explanation, Demonstration, Handout Education comprehension: Patient verbalized understanding and returned demonstration 04/21/24:    Patient had on 02/18/2024 done expanders removed and silicone implant put in bilaterally.   Patient doing radiation Patient done since last visit pulleys 2 times a day with 1 session prior to radiation.  With great improvement in the left shoulder range of motion. Patient also since last time started CARE exercise program that involve cardio as well as strengthening and stretching.  Continue to have more tightness  on the left than the right. Patient to continue to do pulleys to 2 times a day 20 reps 2 to up to 2 minutes for flexion and abduction. Patient to do 1 session prior to radiation After pulleys patient can work on supine on external rotation and  radiation position. Also gave her active assisted range of motion on the wall for interlocking fingers and doing shoulder flexion. Patient can do 2-3 times a day 10 reps. Discussed with patient working on conditioning prior to returning to work.  With the possibility that she would need to work on the floor. Patient to do a light yoga session.  Including child's pose. Start walking daily Cont CARE program  And follow-up with me the week after radiation stops.   05/12/24:    Patient had on 02/18/2024 done expanders removed and silicone implant put in bilaterally.   Patient radiation finished up on 05/06/2024. Patient report increased tenderness and blistering in left axilla. Limiting her shoulder flexion and abduction on the left more than the right. Patient with increased pain and discomfort past 90 degrees. Reviewed with patient some active assisted range of motion over the edge of the table but keep pain in the left axilla under 2/10. Patient can also continue with pulleys and above exercises 2-3 times a day to maintain her flexibility and motion. Patient continues to participate in CARE exercise program that involve cardio as well as strengthening and stretching. OT discussed with above program to assist patient also with some lower body strengthening to assist her to return to her job in the future..    ASSESSMENT:  CLINICAL IMPRESSION: Patient had Bilateral mastectomy with immediate reconstruction and expanders placement was originally done 12/22/2023.Aaron Aas  Patient had 3 lymph nodes removed from the right and 6 on the left.  Patient had 02/18/2024 expanders removed and silicone implants placed.  Patient ended radiation on 05/06/2024.  With some increased pain and discomfort and blistering in the  left axilla limiting her shoulder flexion and abduction.  Increased pain with range of motion more than 90 degrees.  Patient's right upper extremity within functional limits.  Pt continue to participate  in CARE program that is addressing cardio as well as some strength and flexibility.  Patient report her work or job is not available and she would need to go on the floor doing more physical work.  Patient at this time limited in picking up and pushing and pulling and carrying heavy loads.  With just finishing the radiation and with increased limited left range of motion and strength.  Patient to continue to address active assisted range of motion for bilateral shoulders but focusing on healing the skin on the left axilla.  Overall conditioning and participating in the exercise program-  patient can benefit from skilled OT services to increase motion and strength in bilateral upper extremity and monitor her for lymphedema symptoms and signs as she add more activities and return to prior level of function.    Pt will benefit from skilled therapeutic intervention to improve on the following deficits: Decreased knowledge of precautions and lymphedema education, impaired UE functional use, pain, decreased ROM, postural dysfunction.   OT treatment/interventions: ADL/self-care home management, pt/family education, therapeutic exercise,manual therapy  REHAB POTENTIAL: Good  CLINICAL DECISION MAKING: Stable/uncomplicated  EVALUATION COMPLEXITY: Low   GOALS: Goals reviewed with patient? YES  LONG TERM GOALS: (STG=LTG)    Name Target Date Goal status  1 Pt will be able to verbalize understanding of pertinent lymphedema risk reduction practices relevant to her dx specifically related to skin care.  Baseline:  No knowledge 12 weeks ONGOING  2 Pt will be able to return demo and/or verbalize understanding of the post op HEP related to regaining shoulder ROM. Baseline:  No knowledge 12 weeks MET       4 Pt will demo she has regained full shoulder ROM and function post operatively compared to baselines.  Baseline: See objective measurements taken today. 12 wks  ONGOING     PLAN:  OT FREQUENCY/DURATION:  6 visits 12 weeks     Occupational Therapy Information for After Breast Cancer Surgery/Treatment:  Lymphedema is a swelling condition that you may be at risk for in your arm if you have lymph nodes removed from the armpit area.  After a sentinel node biopsy, the risk is approximately 5-9% and is higher after an axillary node dissection.  There is treatment available for this condition and it is not life-threatening.  Contact your physician or occupational therapist with concerns. You may begin the 4 shoulder/posture exercises (see additional sheet) when permitted by your physician (typically a week after surgery).  If you have drains, you may need to wait until those are removed before beginning range of motion exercises.  A general recommendation is to not lift your arms above shoulder height until drains are removed.  These exercises should be done to your tolerance and gently.  This is not a "no pain/no gain" type of recovery so listen to your body and stretch into the range of motion that you can tolerate, stopping if you have pain.  If you are having immediate reconstruction, ask your plastic surgeon about doing exercises as he or she may want you to wait. We encourage you to watch the ABC (After Breast Cancer)  video. You will learn information related to lymphedema risk, prevention and treatment and additional exercises to regain mobility following surgery.  While undergoing any medical procedure or treatment,  try to avoid blood pressure being taken or needle sticks from occurring on the arm on the side of cancer.   This recommendation begins after surgery and continues for the rest of your life.  This may help reduce your risk of getting lymphedema (swelling in your arm). An excellent resource for those seeking information on lymphedema is the National Lymphedema Network's web site. It can be accessed at www.lymphnet.org If you notice swelling in your hand, arm or breast at any time following  surgery (even if it is many years from now), please contact your doctor or occupational therapist to discuss this.  Lymphedema can be treated at any time but it is easier for you if it is treated early on.  If you feel like your shoulder motion is not returning to normal in a reasonable amount of time, please contact your surgeon or occupational therapist.  [facility] [phone]. [address] [place], [place] [address]      Heloise Lobo, OTR/L,CLT 05/12/2024, 6:03 PM

## 2024-05-13 ENCOUNTER — Encounter

## 2024-05-13 ENCOUNTER — Ambulatory Visit (INDEPENDENT_AMBULATORY_CARE_PROVIDER_SITE_OTHER): Admitting: Surgical

## 2024-05-13 VITALS — BP 145/80 | HR 85

## 2024-05-13 DIAGNOSIS — C50911 Malignant neoplasm of unspecified site of right female breast: Secondary | ICD-10-CM

## 2024-05-13 DIAGNOSIS — D0511 Intraductal carcinoma in situ of right breast: Secondary | ICD-10-CM

## 2024-05-13 DIAGNOSIS — Z9013 Acquired absence of bilateral breasts and nipples: Secondary | ICD-10-CM

## 2024-05-13 NOTE — Progress Notes (Signed)
 Referring Provider Normie Becton, FNP No address on file   CC:  Chief Complaint  Patient presents with   Follow-up      Angelica Dougherty is an 53 y.o. female.  HPI: Patient is a 53 year old female here for follow-up on her bilateral breast reconstruction.  She most recently underwent expander to implant exchange with Dr. Orin Birk on 02/18/2024.  She overall recovered well, but has been working with occupational therapy for improved range of motion and strengthening.  She did require left breast radiation which she completed 1 week ago on 05/06/2024.  She reports that she has been making some progress with OT, however reports continued difficulty with heavy lifting and range of motion of her left shoulder.  She is unable to abduct her left shoulder greater than 90 degrees.  She reports also not being able to lift much.  She reports she is working with a boot camp to increase her activity and lifting.  She reports she is currently lifting very light weights around 3 pounds.  She has a very physically demanding job which requires heavy lifting and she does not feel as if she would be able to do that at this time.  She has been working with occupational therapy, reports she had an appointment yesterday, reports her next appointment is 06/02/2024.  She is also undergoing cardiac rehab at [facility] for strength and conditioning.  Review of Systems General: No fevers or chills.  Left breast tightness  Physical Exam    05/13/2024   11:54 AM 04/29/2024    1:21 PM 04/13/2024   12:53 PM  Vitals with BMI  Height  6\' 1"  6' 0.87"  Weight  184 lbs 6 oz 183 lbs 11 oz  BMI  24.33 24.32  Systolic 145 135   Diastolic 80 75   Pulse 85 91     General:  No acute distress,  Alert and oriented, Non-Toxic, Normal speech and affect Right breast: Right breast incision is intact and very well-healed.  Implant is in place, soft.  No overlying erythema or cellulitic changes.  Nontender.  Previous scabbing has  nearly completely healed.  She does have 2 small scabs on the right lower mastectomy flap along the lateral portion.  No surrounding erythema. Left breast: Left breast with significant radiation skin changes, hyperpigmentation and some shape changes to the left breast.  Implant is in place, soft.  She does have some scarring along the central breast causing bulging at the superior pole aspect.  Radiation skin changes also noted within the axilla.  Decreased range of motion noted of left arm, unable to abduct past around 90 to 100 degrees.  Assessment/Plan Patient is a very pleasant 53 year old female status post bilateral mastectomy, breast reconstruction with tissue expanders and subsequent implant placement about 3 months ago.  She is continuing to have ongoing discomfort in the left arm, difficulty with lifting heavy objects and difficulty with full range of motion of her left shoulder.  She is having some improvement in the right shoulder, but left shoulder has been much less progressive.  She is working with both occupational therapy and cardiac rehab for strengthening and conditioning and range of motion improvement.  She reports that her job is typically very physically demanding and does not feel as if she would be able to complete her job duties at this point.  I would like her to continue with OT and cardiac rehab.  I have reached out to OT to see if she  can have more frequent appointments for more direct assistance with improving range of motion and lifting.  I discussed my concerns with patient today, given her ongoing discomfort, tightness and difficulty with range of motion, I think it is very reasonable for her to continue to remain out of work and work on increasing strength and range of motion.  We did have a long discussion that I want her to continue to improve, focus on working on recovery at home as well.  Of note, she has having significant discomfort from the radiation and reports  wearing a bra or any other garment is very uncomfortable.  I think this is also a limiting factor to her returning to work.  Pictures were obtained of the patient and placed in the chart with the patient's or guardian's permission.   Janalyn Me Temesgen Weightman 05/13/2024, 12:54 PM

## 2024-05-14 ENCOUNTER — Inpatient Hospital Stay

## 2024-05-17 ENCOUNTER — Other Ambulatory Visit

## 2024-05-18 ENCOUNTER — Encounter: Admitting: *Deleted

## 2024-05-18 ENCOUNTER — Telehealth: Payer: Self-pay

## 2024-05-18 DIAGNOSIS — C50912 Malignant neoplasm of unspecified site of left female breast: Secondary | ICD-10-CM

## 2024-05-18 NOTE — Progress Notes (Signed)
 Daily Session Note  Patient Details  Name: Angelica Dougherty MRN: [id number] Date of Birth: 1971/02/08 Referring Provider:   Gattis Kass Cancer Associated Rehabilitation & Exercise from 04/13/2024 in [facility]  Referring Provider Loreatha Rodney, MD       Encounter Date: 05/18/2024  Check In:  Session Check In - 05/18/24 1222       Check-In   Supervising physician immediately available to respond to emergencies See telemetry face sheet for immediately available ER MD    Location [facility]    Staff Present Sue Em RN,BSN;Noah Tickle, BS, Exercise Physiologist    Virtual Visit No    Medication changes reported     No    Fall or balance concerns reported    No    Warm-up and Cool-down Performed on first and last piece of equipment    Resistance Training Performed Yes    VAD Patient? No    PAD/SET Patient? No      Pain Assessment   Currently in Pain? No/denies                Social History   Tobacco Use  Smoking Status Never   Passive exposure: Never  Smokeless Tobacco Never    Goals Met:  Independence with exercise equipment Exercise tolerated well No report of concerns or symptoms today Strength training completed today  Goals Unmet:  Not Applicable  Comments: Pt able to follow exercise prescription today without complaint.  Will continue to monitor for progression.    Dr. Firman Hughes is Medical Director for [facility] Cardiac Rehabilitation.  Dr. Fuad Aleskerov is Medical Director for [facility] Pulmonary Rehabilitation.

## 2024-05-18 NOTE — Telephone Encounter (Signed)
 Pt called to reschedule her procedure  Procedure r/s  PPW released to Mychart and mailed

## 2024-05-20 ENCOUNTER — Encounter

## 2024-05-20 DIAGNOSIS — C50912 Malignant neoplasm of unspecified site of left female breast: Secondary | ICD-10-CM

## 2024-05-20 NOTE — Progress Notes (Signed)
 Daily Session Note  Patient Details  Name: Angelica Dougherty MRN: [id number] Date of Birth: [birth year] Referring Provider:   Gattis Kass Cancer Associated Rehabilitation & Exercise from 04/13/2024 in [facility]  Referring Provider Loreatha Rodney, MD    Encounter Date: 05/20/2024  Check In:  Session Check In - 05/20/24 1229       Check-In   Supervising physician immediately available to respond to emergencies See telemetry face sheet for immediately available ER MD    Location [facility]    Staff Present Lyell Samuel, MS, Exercise Physiologist    Virtual Visit No    Medication changes reported     No    Fall or balance concerns reported    No    Warm-up and Cool-down Performed on first and last piece of equipment    Resistance Training Performed Yes    VAD Patient? No    PAD/SET Patient? No      Pain Assessment   Currently in Pain? No/denies    Multiple Pain Sites No             Social History   Tobacco Use  Smoking Status Never   Passive exposure: Never  Smokeless Tobacco Never    Goals Met:  Independence with exercise equipment Exercise tolerated well No report of concerns or symptoms today  Goals Unmet:  Not Applicable  Comments: Pt able to follow exercise prescription today without complaint.  Will continue to monitor for progression.    Dr. Firman Hughes is Medical Director for [facility] Card[facility] Rehabilitation.  Dr. Fuad Aleskerov is Medical Director for [facility] Pulmonary Rehabilitation.

## 2024-05-24 ENCOUNTER — Encounter: Payer: Self-pay | Admitting: Oncology

## 2024-05-24 ENCOUNTER — Inpatient Hospital Stay (HOSPITAL_BASED_OUTPATIENT_CLINIC_OR_DEPARTMENT_OTHER): Admitting: Oncology

## 2024-05-24 VITALS — BP 132/76 | HR 81 | Temp 98.4°F | Resp 18 | Ht 73.0 in | Wt 174.8 lb

## 2024-05-24 DIAGNOSIS — Z79811 Long term (current) use of aromatase inhibitors: Secondary | ICD-10-CM | POA: Insufficient documentation

## 2024-05-24 DIAGNOSIS — Z170 Estrogen receptor positive status [ER+]: Secondary | ICD-10-CM | POA: Diagnosis not present

## 2024-05-24 DIAGNOSIS — C50911 Malignant neoplasm of unspecified site of right female breast: Secondary | ICD-10-CM

## 2024-05-24 DIAGNOSIS — R293 Abnormal posture: Secondary | ICD-10-CM | POA: Diagnosis not present

## 2024-05-24 DIAGNOSIS — Z1732 Human epidermal growth factor receptor 2 negative status: Secondary | ICD-10-CM | POA: Diagnosis not present

## 2024-05-24 DIAGNOSIS — C50912 Malignant neoplasm of unspecified site of left female breast: Secondary | ICD-10-CM | POA: Diagnosis not present

## 2024-05-24 DIAGNOSIS — M25612 Stiffness of left shoulder, not elsewhere classified: Secondary | ICD-10-CM | POA: Diagnosis not present

## 2024-05-24 DIAGNOSIS — Z803 Family history of malignant neoplasm of breast: Secondary | ICD-10-CM | POA: Insufficient documentation

## 2024-05-24 DIAGNOSIS — Z9013 Acquired absence of bilateral breasts and nipples: Secondary | ICD-10-CM | POA: Diagnosis not present

## 2024-05-24 DIAGNOSIS — C50812 Malignant neoplasm of overlapping sites of left female breast: Secondary | ICD-10-CM | POA: Diagnosis not present

## 2024-05-24 DIAGNOSIS — Z780 Asymptomatic menopausal state: Secondary | ICD-10-CM | POA: Diagnosis not present

## 2024-05-24 DIAGNOSIS — M25611 Stiffness of right shoulder, not elsewhere classified: Secondary | ICD-10-CM | POA: Diagnosis not present

## 2024-05-24 DIAGNOSIS — Z1721 Progesterone receptor positive status: Secondary | ICD-10-CM | POA: Diagnosis not present

## 2024-05-24 MED ORDER — ANASTROZOLE 1 MG PO TABS: 1.0000 mg | ORAL_TABLET | Freq: Every day | ORAL | 3 refills | Status: DC

## 2024-05-24 NOTE — Assessment & Plan Note (Signed)
-  s/p bilateral mastectomies with immediate reconstruction by Dr. Cornel Diesel and Dr. Orin Birk on 12/23/2023.  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  6 lymph nodes removed.  1 lymph node positive for individual tumor cells.  Tumor size 16 mm, overall grade 3, anterior superior margins involved, ER 90% positive, PR 90% positive, HER2 1+ IHC negative.  pT1 pN0 (I+), - Oncotype DX score 13.  No role for adjuvant chemo.   - close margin, not able to re-excise per Surgery.  - s/p postmastectomy radiation on the left side - Postmenopausal state - FSH>40, Estradiol  <6 , recommend adjuvant endocrine therapy with AI Rationale of using aromatase inhibitor -Arimidex  discussed with patient.  Side effects of Arimidex including but not limited to hot flash, muscle and joint pain, fatigue, mood swing, vaginal dryness/inching, loss of bone mineral density,hair thinning, heart problem, etc were discussed with patient.  Patient voices understanding and willing to proceed.     - Tissue expanders were removed and has breast implants placed.  Doing well

## 2024-05-24 NOTE — Progress Notes (Signed)
 Hematology/Oncology Progress note [phone] [phone]        REFERRING PROVIDER: Normie Becton, FNP    CHIEF COMPLAINTS/PURPOSE OF CONSULTATION:  History of bilateral stage I breast cancer.   ASSESSMENT & PLAN:   Breast cancer, left [facility]) -s/p bilateral mastectomies with immediate reconstruction by Dr. Cornel Diesel and Dr. Orin Birk on 12/23/2023.  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  6 lymph nodes removed.  1 lymph node positive for individual tumor cells.  Tumor size 16 mm, overall grade 3, anterior superior margins involved, ER 90% positive, PR 90% positive, HER2 1+ IHC negative.  pT1 pN0 (I+), - Oncotype DX score 13.  No role for adjuvant chemo.   - close margin, not able to re-excise per Surgery.  - s/p postmastectomy radiation on the left side - Postmenopausal state - FSH>40, Estradiol  <6 , recommend adjuvant endocrine therapy with AI Rationale of using aromatase inhibitor -Arimidex  discussed with patient.  Side effects of Arimidex including but not limited to hot flash, muscle and joint pain, fatigue, mood swing, vaginal dryness/inching, loss of bone mineral density,hair thinning, heart problem, etc were discussed with patient.  Patient voices understanding and willing to proceed.     - Tissue expanders were removed and has breast implants placed.  Doing well    Breast cancer, right [facility]) - s/p right breast mastectomy.  Tumor size 12 mm overall grade 3.  Margins negative.  0/3 lymph node involved.  ER 95% positive, PR 95%, HER2 IHC 2+ by FISH.  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   - Oncotype DX score 25.  With age more than 56, no benefit from adjuvant chemotherapy. - see above management  Family history of breast cancer -[employer] gene panel testing done in 2024 was negative   Aromatase inhibitor use Baseline DEXA May 2025 - normal.  Recommend calcium 1200mg  and Vitamin D  supplementation.   Orders Placed This Encounter   Procedures   CBC with Differential (Cancer Center Only)    Standing Status:   Future    Expected Date:   08/24/2024    Expiration Date:   11/22/2024   CMP (Cancer Center only)    Standing Status:   Future    Expected Date:   08/24/2024    Expiration Date:   11/22/2024   Follow up in 3 months  All questions were answered. The patient knows to call the clinic with any problems, questions or concerns.  Timmy Forbes, MD, PhD [facility] Hematology Oncology 05/24/2024    HISTORY OF PRESENTING ILLNESS:  Angelica Dougherty 53 y.o. female presents to establish care for breast cancer.  Patient previously followed up with Dr.Agrawal. Patient switched care to me on 05/24/2024  Extensive medical record review was performed by me   I have reviewed her chart and materials related to her cancer extensively and collaborated history with the patient. Summary of oncologic history is as follows: Oncology History  Breast cancer, left (HCC)  08/26/2023 Mammogram   Screening mammogram IMPRESSION: Further evaluation is suggested for possible calcifications in the right breast.   Further evaluation is suggested for possible mass in the left breast.  Diagnostic mammogram and ultrasound FINDINGS: RIGHT breast: In the upper central right breast at middle depth, there are amorphous and round calcifications in a linear distribution spanning 1.9 cm. These are new compared to most recent mammogram 12/31/2017. No suspicious mass or other findings in the right breast.   LEFT breast: The previously noted possible  mass seen in the upper posterior left breast on MLO view only, overlying the pectoralis muscle, persists on additional views as a 1.8 cm irregular mass with indistinct margins in the associated architectural distortion. On tomosynthesis series, the mass localizes to the medial breast. No definite mammographic correlate seen on [facility] M view, likely due to the far posterior location. No suspicious  calcifications or other suspicious findings in the left breast.   Targeted ultrasound of the left breast at the 11:30 position 18 cm from the nipple demonstrates a 2.1 x 1.2 x 1.2 cm irregular hypoechoic mass with indistinct and angular margins, corresponding to the mammographic finding.   Targeted ultrasound of the left axilla demonstrates lymph nodes with normal morphology.   IMPRESSION: 1. Highly suspicious 2.1 cm mass at the left breast 11:30 position. 2. Suspicious linear calcifications in the upper central right breast. 3. No left axillary lymphadenopathy.     09/25/2023 Pathology Results   FINAL DIAGNOSIS       1. Breast, right, needle core biopsy, upper middle depth, ribbon clip :      - DUCTAL CARCINOMA IN SITU, INTERMEDIATE GRADE (2)      - CANNOT RULE OUT FOCAL MICROINVASION      - NECROSIS: PRESENT, COMEDO-TYPE      - CALCIFICATIONS: PRESENT      - DCIS LENGTH: 0.22 CM      - SEE NOTE       2. 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      - TUBULE FORMATION: SCORE 3      - NUCLEAR PLEOMORPHISM: SCORE 3      - MITOTIC COUNT: SCORE 2      - TOTAL SCORE: 8      - OVERALL GRADE: 3      - LYMPHOVASCULAR INVASION: NOT IDENTIFIED      - CANCER LENGTH: 1.1 CM      - CALCIFICATIONS: NOT IDENTIFIED      - SEE NOTE  Results: IMMUNOHISTOCHEMICAL AND MORPHOMETRIC ANALYSIS PERFORMED MANUALLY Estrogen Receptor:  95%, POSITIVE, STRONG STAINING INTENSITY REFERENCE RANGE ESTROGEN RECEPTOR NEGATIVE     0% POSITIVE       =>1% All controls stained appropriately Gerre Kraft, Zhaoli, Sports administrator, International aid/development worker ( Signed 10 21 2024) Breast, left, needle core biopsy, 11:30 o'clock, 18cmfn savi scout PROGNOSTIC INDICATORS  Results: IMMUNOHISTOCHEMICAL AND MORPHOMETRIC ANALYSIS PERFORMED MANUALLY The tumor cells are negative for Her2 (1+). Estrogen Receptor:  90%, POSITIVE, STRONG  STAINING INTENSITY Progesterone Receptor:  90%, POSITIVE, STRONG STAINING INTENSITY REFERENCE RANGE ESTROGEN RECEPTOR NEGATIVE     0% POSITIVE       =>1% REFERENCE RANGE PROGESTERONE RECEPTOR NEGATIVE     0% POSITIVE        =>1%    09/30/2023 Cancer Staging   Staging form: Breast, AJCC 8th Edition - Clinical: Stage IIA (cT2, cN0, cM0, G3, ER+, PR+, HER2-) - Signed by Agrawal, Kavita, MD on 09/30/2023 Histologic grading system: 3 grade system    Genetic Testing   Negative genetic testing on the [facility] BRCAPlus+CancerNext-Expanded+RNA panel. The final report date is 10/27/2023.  The CancerNext-Expanded gene panel offered by [facility] and includes sequencing, rearrangement, and RNA analysis for the following 71 genes: AIP, ALK, APC, ATM, BAP1, BARD1, BMPR1A, BRCA1, BRCA2, BRIP1, CDC73, CDH1, CDK4, CDKN1B, CDKN2A, CHEK2, DICER1, FH, FLCN, KIF1B, LZTR1, MAX, MEN1, MET, MLH1, MSH2, MSH6, MUTYH, NF1, NF2,  NTHL1, PALB2,  PHOX2B, PMS2, POT1, PRKAR1A, PTCH1, PTEN, RAD51C, RAD51D, RB1, RET, SDHA, SDHAF2, SDHB, SDHC, SDHD, SMAD4, SMARCA4, SMARCB1, SMARCE1, STK11, SUFU, TMEM127, TP53, TSC1, TSC2 and VHL (sequencing and deletion/duplication); AXIN2, CTNNA1, EGFR, EGLN1, HOXB13, KIT, MITF, MSH3, PDGFRA, POLD1 and POLE (sequencing only); EPCAM and GREM1 (deletion/duplication only).   10/08/2023 Breast MRI   MRI bilateral breast-  IMPRESSION: 1. Biopsy-proven DCIS involving the UPPER INNER QUADRANT of the RIGHT breast at middle depth and biopsy-proven IDC with lobular features involving the UPPER INNER QUADRANT of the LEFT breast at posterior depth. 2. Suspicious 1.1 cm mass in the upper RIGHT breast at posterior depth, directly posterior to the biopsy-proven DCIS. 3. Indeterminate 2.0 cm non-mass enhancement involving the LOWER OUTER QUADRANT of the RIGHT breast at anterior depth. 4. Indeterminate 1.2 cm mass in the lower LEFT breast at posterior depth, adjacent to the chest wall. 5. 2 foci of  indeterminate linear non-mass enhancement in the LEFT breast; 1.9 cm NME in the LOWER INNER QUADRANT middle depth and 2.2 cm NME in the lower breast at middle depth. 6. Solitary suspicious enhancing lesion involving the lower sternum. 7. Benign enhancing mass in the outer LEFT breast at the near 3 o'clock location (stable over prior mammograms dating back to 2019).   RECOMMENDATION: 1. MRI-directed second-look ultrasound of both breasts to see if the masses described above can be identified and possibly biopsied if suspicious in appearance. 2. MRI guided core needle biopsy of the indeterminate non-mass enhancement involving the LOWER OUTER QUADRANT of the RIGHT breast and the 2 foci of non-mass enhancement in the LOWER INNER QUADRANT of the LEFT breast and the lower LEFT breast.     12/22/2023 Cancer Staging   Staging form: Breast, AJCC 8th Edition - Pathologic stage from 12/22/2023: Stage IA (pT1c, pN0(i+)(sn), cM0, G3, ER+, PR+, HER2-) - Signed by Agrawal, Kavita, MD on 01/20/2024 Stage prefix: Initial diagnosis Method of lymph node assessment: Sentinel lymph node biopsy Multigene prognostic tests performed: Oncotype DX Histologic grading system: 3 grade system    Pathology Results            12/23/2023 Definitive Surgery   S/p bilateral mastectomies with immediate reconstruction with Dr. Cornel Diesel and Dr. Orin Birk    Pathology Results   FINAL DIAGNOSIS       1. Breast, simple mastectomy, left :      - INVASIVE DUCTAL CARCINOMA WITH FOCAL LOBULAR FEATURES      - SEE ONCOLOGY TABLE AND NOTE       2. Lymph node, sentinel, biopsy, #2 right :      - ONE LYMPH NODE, NEGATIVE FOR METASTATIC CARCINOMA (0/1)      - SEE ONCOLOGY TABLE AND NOTE       3. Lymph node, sentinel, biopsy, #3 right :      - ONE LYMPH NODE, NEGATIVE FOR METASTATIC CARCINOMA (0/1)      - SEE ONCOLOGY TABLE AND NOTE       4. Lymph node, sentinel, biopsy, #1left :      - FOCAL BREAST PARENCHYMA WITH  MARKED THERMAL ARTIFACT      - BENIGN FIBROADIPOSE TISSUE AND FOCAL MUSCLE      - LYMPHOID TISSUE IS NOT IDENTIFIED      - NEGATIVE FOR DEFINITIVE MALIGNANCY      - SEE ONCOLOGY TABLE AND NOTE       5. Lymph node, sentinel, biopsy, #2 left :      - FOCAL BREAST PARENCHYMA WITH SECRETORY  CHANGE, ADENOSIS, FIBROCYSTIC CHANGE      AND THERMAL ARTIFACT      - BENIGN FIBROADIPOSE TISSUE AND SCANT MUSCLE      - LYMPHOID TISSUE IS NOT IDENTIFIED      - NEGATIVE FOR MALIGNANCY      - SEE NOTE AND ONCOLOGY TABLE       6. Lymph node, sentinel, biopsy, #3 left :      - BREAST PARENCHYMA WITH FIBROCYSTIC CHANGES SECRETORY CHANGE, ADENOSIS AND      MARKED THERMAL ARTIFACT      - BENIGN FIBROADIPOSE TISSUE      - LYMPHOID TISSUE IS NOT IDENTIFIED      - NEGATIVE FOR DEFINITIVE MALIGNANCY      - SEE ONCOLOGY TABLE AND NOTE       7. Lymph node, sentinel, biopsy, #4 left :      - FOCAL BREAST PARENCHYMA WITH SECRETORY CHANGE, ADENOSIS  AND THERMAL ARTIFACT      - BENIGN FIBROADIPOSE TISSUE      - LYMPHOID TISSUE IS NOT IDENTIFIED      - NEGATIVE FOR MALIGNANCY      - SEE NOTE AND ONCOLOGY TABLE       8. Lymph node, sentinel, biopsy, #5 left :      - FOCAL BREAST PARENCHYMA WITH SECRETORY CHANGE AND THERMAL ARTIFACT      - BENIGN FIBROADIPOSE TISSUE AND SCANT MUSCLE      - LYMPHOID TISSUE IS NOT IDENTIFIED      - NEGATIVE FOR MALIGNANCY      - SEE NOTE AND ONCOLOGY TABLE       9. Lymph node, sentinel, biopsy, #6 left :      - ONE LYMPH NODE POSITIVE FOR INDIVIDUAL TUMOR CELLS (0/1)      - SEE ONCOLOGY TABLE AND NOTE       10. Breast, simple mastectomy, Right :      - MULTIFOCAL, INVASIVE CARCINOMA WITH MIXED DUCTAL AND LOBULAR FEATURES      - INVASIVE DUCTAL CARCINOMA AND DUCTAL CARCINOMA IN SITU (DCIS)      - SEE ONCOLOGY TABLE AND NOTE       11. Lymph node, sentinel, biopsy, #1right :      - ONE LYMPH NODE, NEGATIVE FOR METASTATIC CARCINOMA (0/1)      - SEE ONCOLOGY TABLE AND NOTE        Diagnosis Note : - 7.      INVASIVE CARCINOMA OF THE BREAST:  Resection      Procedure: Mastectomy      Specimen Laterality: Left      Histologic Type: Invasive ductal carcinoma with focal lobular features      Histologic Grade:      Glandular (Acinar)/Tubular Differentiation: 3      Nuclear Pleomorphism: 3      Mitotic Rate: 2      Overall Grade: 3      Tumor Size: 16 mm      Ductal Carcinoma In Situ: Not identified      Tumor Extent: N/A      Lymphatic and/or Vascular Invasion: Not identified      Treatment Effect in the Breast: No known presurgical therapy      Margins:      Distance from Closest Margin (mm): Anterior/Superior (Involved); Posterior (< 1      mm)      Specify Closest Margin (required only if <70mm): see above      Regional  Lymph Nodes:      Number of Lymph Nodes Examined: 1      Number of Sentinel Nodes Examined: 1      Number of Lymph Nodes with Macrometastases (>2 mm): 0      Number of Lymph Nodes with Micrometastases: 0      Number of Lymph Nodes with Isolated Tumor Cells (=0.2 mm or =200 cells): 1      Size of Largest Metastatic Deposit (mm): Less than or equal to 0.2 mm, 200 cells      or less      Extranodal Extension: N/A      Distant Metastasis:   Estrogen Receptor: 90%, positive, strong staining intensity       Progesterone Receptor: 90%, positive, strong staining intensity       HER2: Negative (1+)       Ki-67: Not performed       Pathologic Stage Classification (pTNM, AJCC 8th Edition): pT1c, pN0 (i+) (sn)   Specimen Laterality: Right      Histologic Type: Invasive carcinoma with mixed ductal and lobular features (see      comment below)      Histologic Grade:      Glandular (Acinar)/Tubular Differentiation: 3      Nuclear Pleomorphism: 3      Mitotic Rate: 2      Overall Grade: 3      Tumor Size: 12 mm      Ductal Carcinoma In Situ: Present (ribbon clip site, see comment below)      Tumor Extent: N/A      Lymphatic and/or Vascular  Invasion: Not identified      Treatment Effect in the Breast: No known presurgical therapy      Margins: All margins negative for invasive carcinoma or carcinoma in-situ      Distance from Closest Margin (mm): 3 mm      Specify Closest Margin (required only if  <55mm): Anterior/Inferior      DCIS Margins: Uninvolved by DCIS      Distance from Closest Margin (mm): 9 mm      Specify Closest Margin (required only if <90mm): Anterior/superior      Regional Lymph Nodes:      Number of Lymph Nodes Examined: 3      Number of Sentinel Nodes Examined: 3      Number of Lymph Nodes with Macrometastases (>2 mm): 0      Number of Lymph Nodes with Micrometastases: 0      Number of Lymph Nodes with Isolated Tumor Cells (=0.2 mm or =200 cells): 0      Size of Largest Metastatic Deposit (mm): N/A      Extranodal Extension: N/A      Distant Metastasis:      Distant Site(s) Involved: N/A      Breast Biomarker Testing Performed on Previous Biopsy:      Testing Performed on Case Number: [id number] (part 1, ribbon clip) for DCIS      Estrogen Receptor: 95%, positive, strong staining intensity      Progesterone Receptor: Not performed      HER2: Not performed      Ki-67: Not performed      Testing Performed on Case Number: [id number] (part 1, barbell clip)      Estrogen Receptor: 95%, positive, strong staining intensity      Progesterone Receptor: 95%, positive, strong staining intensity      HER2: FISH (negative), equivocal by IHC (2+)  Ki-67: Not performed      Pathologic Stage Classification (pTNM, AJCC 8th Edition): pT1c, pN0 (sn)      Representative Tumor Block:  8D, 8K      Comment(s): Biopsy site changes are present in both clip sites.  Multifocal      invasive carcinoma is present:      Lesion A (Ribbon Clip): Invasive ductal carcinoma, grade 3 (3, 3, 2); ductal      carcinoma in situ (DCIS), cribriform and solid types with focal necrosis, grade      2      Size of invasive component: 8 mm  (2 blocks, 4 mm each)      Margins: All margins negative for invasive carcinoma and carcinoma in situ      Closest margin: Anterior/superior is 9.0 mm from DCIS      Lesion B (Barbell Clip): Invasive carcinoma with mixed ductal and lobular      features, grade 3      Atypical lobular hyperplasia (ALH)      Size of invasive component: 12 mm (3 blocks, 4 mm each)      Margins: All margins negative for invasive carcinoma      Closest margin: Anterior/Inferior is 3.0 mm from invasive carcinoma      Lesion C (no clip identified): Invasive carcinoma with mixed ductal and lobular      features, grade 3      Atypical lobular hyperplasia (ALH)      Size of invasive component: 12 mm (3 blocks, 4 mm each)      Margins: All margins negative for invasive carcinoma      Breast cancer, right (HCC)  12/22/2023 Cancer Staging   Staging form: Breast, AJCC 8th Edition - Pathologic stage from 12/22/2023: pT1c, pN0, cM0, ER+, PR+, HER2- - Signed by Agrawal, Kavita, MD on 01/20/2024 Stage prefix: Initial diagnosis Nuclear grade: G3 Multigene prognostic tests performed: Oncotype DX   01/20/2024 Initial Diagnosis   Breast cancer, right [facility])    Patient is s/p postmastectomy radiation.  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 [facility]   BREAST BIOPSY Right 09/25/2023   stereo bx, calcs, RIBBON clip-path pending   BREAST BIOPSY Left  09/25/2023   US  bx, savi tag as marker, path pending   BREAST BIOPSY Right 09/25/2023   MM RT BREAST BX W LOC DEV 1ST LESION IMAGE BX SPEC STEREO GUIDE 09/25/2023 [facility]   BREAST BIOPSY Left 09/25/2023   US  LT BREAST BX W LOC DEV 1ST LESION IMG BX SPEC US  GUIDE 09/25/2023 [facility]   BREAST EXCISIONAL BIOPSY Right 1993   benign   BREAST LUMPECTOMY Right 1991   benign   BREAST RECONSTRUCTION WITH PLACEMENT OF TISSUE EXPANDER AND FLEX HD (ACELLULAR HYDRATED DERMIS) Bilateral 12/22/2023   Procedure: BREAST RECONSTRUCTION WITH PLACEMENT OF TISSUE EXPANDER AND FLEX HD (ACELLULAR HYDRATED DERMIS);  Surgeon: Thornell Flirt, DO;  Location: [facility];  Service: Government social research officer;  Laterality: Bilateral;   CESAREAN SECTION  12/16/2005   MASTECTOMY W/ SENTINEL NODE BIOPSY Bilateral 12/22/2023   Procedure: MASTECTOMY WITH SENTINEL LYMPH NODE BIOPSY, simple mastectomy;  Surgeon: Barrett Lick, MD;  Location: [facility];  Service: General;  Laterality: Bilateral;   REMOVAL OF TISSUE EXPANDER AND PLACEMENT OF IMPLANT Bilateral 02/18/2024   Procedure: REMOVAL, TISSUE EXPANDER, BREAST, WITH IMPLANT INSERTION;  Surgeon: Thornell Flirt, DO;  Location: [facility];  Service: Plastics;  Laterality: Bilateral;    SOCIAL HISTORY: Social History   Socioeconomic History   Marital status: Single    Spouse name: Not on file   Number of children: 1   Years of education: Not on file   Highest education level: Some college, no degree  Occupational History   Not on file  Tobacco Use   Smoking status: Never    Passive exposure: Never   Smokeless tobacco: Never  Vaping Use   Vaping status: Never Used  Substance and Sexual Activity   Alcohol use: No   Drug use: No   Sexual activity: Not Currently  Other Topics Concern   Not on file  Social History Narrative   Not on file   Social Drivers of Health   Financial Resource Strain: Low Risk  (08/15/2023)   Overall Financial  Resource Strain (CARDIA)    Difficulty of Paying Living Expenses: Not very hard  Food Insecurity: No Food Insecurity (12/22/2023)   Hunger Vital Sign    Worried About Running Out of Food in the Last Year: Never true    Ran Out of Food in the Last Year: Never true  Transportation Needs: No Transportation Needs (12/22/2023)   PRAPARE - Administrator, Civil Service (Medical): No    Lack of Transportation (Non-Medical): No  Physical Activity: Insufficiently Active (08/15/2023)   Exercise Vital Sign    Days of Exercise per Week: 2 days    Minutes of Exercise per Session: 20 min  Stress: No Stress Concern Present (08/15/2023)   [employer] of Occupational Health - Occupational Stress Questionnaire    Feeling of Stress : Not at all  Social Connections: Moderately Integrated (08/15/2023)   Social Connection and Isolation Panel    Frequency of Communication with Friends and Family: More than three times a week    Frequency of Social Gatherings with Friends and Family: Once a week    Attends Religious Services: More than 4 times per year    Active Member of [employer] or Organizations: Yes    Attends Engineer, structural: More than 4 times per year    Marital Status: Never married  Intimate Partner Violence: Not At Risk (12/22/2023)   Humiliation, Afraid, Rape, and Kick questionnaire    Fear of Current or Ex-Partner: No    Emotionally Abused: No    Physically Abused: No    Sexually Abused: No    FAMILY HISTORY: Family History  Problem Relation Age of Onset   Asthma Mother    Cancer Maternal Uncle        back cancer at 71   Breast cancer Paternal Aunt        dx 30s, bilateral   Breast cancer Paternal Aunt        dx 19s   Breast cancer Cousin        dx 74s    ALLERGIES:  is allergic to codeine.  MEDICATIONS:  Current Outpatient Medications  Medication Sig Dispense Refill  acetaminophen  (TYLENOL ) 500 MG tablet Take 500 mg by mouth every 6 (six) hours as needed.      anastrozole (ARIMIDEX) 1 MG tablet Take 1 tablet (1 mg total) by mouth daily. 30 tablet 3   diphenhydrAMINE (BENADRYL) 25 MG tablet Take 25 mg by mouth daily as needed for allergies.     No current facility-administered medications for this visit.    Review of Systems  Constitutional:  Negative for appetite change, chills, fatigue and fever.  HENT:   Negative for hearing loss and voice change.   Eyes:  Negative for eye problems.  Respiratory:  Negative for chest tightness and cough.   Cardiovascular:  Negative for chest pain.  Gastrointestinal:  Negative for abdominal distention, abdominal pain and blood in stool.  Endocrine: Negative for hot flashes.  Genitourinary:  Negative for difficulty urinating and frequency.   Musculoskeletal:  Negative for arthralgias.  Skin:  Negative for itching and rash.  Neurological:  Negative for extremity weakness.  Hematological:  Negative for adenopathy.  Psychiatric/Behavioral:  Negative for confusion.      PHYSICAL EXAMINATION: ECOG PERFORMANCE STATUS: 0 - Asymptomatic  Vitals:   05/24/24 1446  BP: 132/76  Pulse: 81  Resp: 18  Temp: 98.4 F (36.9 C)  SpO2: 100%   Filed Weights   05/24/24 1446  Weight: 174 lb 12.8 oz (79.3 kg)    Physical Exam Constitutional:      General: She is not in acute distress.    Appearance: She is not diaphoretic.  HENT:     Head: Normocephalic and atraumatic.   Eyes:     General: No scleral icterus.   Cardiovascular:     Rate and Rhythm: Normal rate and regular rhythm.  Pulmonary:     Effort: Pulmonary effort is normal. No respiratory distress.     Breath sounds: Normal breath sounds.  Abdominal:     General: There is no distension.     Palpations: Abdomen is soft.     Tenderness: There is no abdominal tenderness.   Musculoskeletal:        General: Normal range of motion.     Cervical back: Normal range of motion and neck supple.   Skin:    General: Skin is warm and dry.     Findings:  No erythema.   Neurological:     Mental Status: She is alert and oriented to person, place, and time. Mental status is at baseline.     Motor: No abnormal muscle tone.   Psychiatric:        Mood and Affect: Mood and affect normal.      LABORATORY DATA:  I have reviewed the data as listed    Latest Ref Rng & Units 04/30/2024   11:59 AM 04/16/2024   11:51 AM 04/02/2024   11:27 AM  CBC  WBC 4.0 - 10.5 K/uL 4.7  5.5  6.2   Hemoglobin 12.0 - 15.0 g/dL 16.1  09.6  04.5   Hematocrit 36.0 - 46.0 % 39.1  37.3  39.6   Platelets 150 - 400 K/uL 271  259  310       Latest Ref Rng & Units 04/30/2024   11:59 AM 01/20/2024    3:35 PM 08/15/2023   10:30 AM  CMP  Glucose 70 - 99 mg/dL 94  85  92   BUN 6 - 20 mg/dL 11  10  8    Creatinine 0.44 - 1.00 mg/dL 4.09  8.11  9.14   Sodium  135 - 145 mmol/L 138  138  138   Potassium 3.5 - 5.1 mmol/L 4.4  4.3  4.6   Chloride 98 - 111 mmol/L 105  101  102   CO2 22 - 32 mmol/L 25  27  24    Calcium 8.9 - 10.3 mg/dL 9.4  9.5  9.9   Total Protein 6.5 - 8.1 g/dL 7.5  7.9  7.1   Total Bilirubin 0.0 - 1.2 mg/dL 0.9  1.1  0.6   Alkaline Phos 38 - 126 U/L 51  49  61   AST 15 - 41 U/L 21  20  22    ALT 0 - 44 U/L 17  18  18       RADIOGRAPHIC STUDIES: I have personally reviewed the radiological images as listed and agreed with the findings in the report. DG Bone Density Result Date: 05/05/2024 EXAM: DUAL X-RAY ABSORPTIOMETRY (DXA) FOR BONE MINERAL DENSITY 05/05/2024 10:31 am CLINICAL DATA:  53 year old Female Postmenopausal. Screening for osteoporosis TECHNIQUE: An axial (e.g., hips, spine) and/or appendicular (e.g., radius) exam was performed, as appropriate, using GE Secretary/administrator at [facility]. Images are obtained for bone mineral density measurement and are not obtained for diagnostic purposes. [id number] Exclusions: None. COMPARISON:  None. FINDINGS: Scan quality: Good. LUMBAR SPINE (L1-L4): BMD (in g/cm2): 1.189 T-score: 0.0  Z-score: -0.1 LEFT FEMORAL NECK: BMD (in g/cm2): 1.038 T-score: 0.0 Z-score: 0.0 LEFT TOTAL HIP: BMD (in g/cm2): 0.964 T-score: -0.3 Z-score: 0.7 RIGHT FEMORAL NECK: BMD (in g/cm2): 1.089 T-score: 0.4 Z-score: 0.4 RIGHT TOTAL HIP: BMD (in g/cm2): 0.959 T-score: -0.4 Z-score: -0.8 FRAX 10-YEAR PROBABILITY OF FRACTURE: FRAX not reported as the lowest BMD is not in the osteopenia range. IMPRESSION: Normal based on BMD. Fracture risk is not increased. RECOMMENDATIONS: 1. All patients should optimize calcium and vitamin D  intake. 2. Consider FDA-approved medical therapies in postmenopausal women and men aged 41 years and older, based on the following: - A hip or vertebral (clinical or morphometric) fracture - T-score less than or equal to -2.5 and secondary causes have been excluded. - Low bone mass (T-score between -1.0 and -2.5) and a 10-year probability of a hip fracture greater than or equal to 3% or a 10-year probability of a major osteoporosis-related fracture greater than or equal to 20% based on the [place] -adapted WHO algorithm. - Clinician judgment and/or patient preferences may indicate treatment for people with 10-year fracture probabilities above or below these levels 3. Patients with diagnosis of osteoporosis or at high risk for fracture should have regular bone mineral density tests. For patients eligible for Medicare, routine testing is allowed once every 2 years. The testing frequency can be increased to one year for patients who have rapidly progressing disease, those who are receiving or discontinuing medical therapy to restore bone mass, or have additional risk factors. Electronically Signed   By: Sundra Engel M.D.   On: 05/05/2024 14:32

## 2024-05-24 NOTE — Assessment & Plan Note (Signed)
 Baseline DEXA May 2025 - normal.  Recommend calcium 1200mg  and Vitamin D  supplementation.

## 2024-05-24 NOTE — Assessment & Plan Note (Signed)
-   s/p right breast mastectomy.  Tumor size 12 mm overall grade 3.  Margins negative.  0/3 lymph node involved.  ER 95% positive, PR 95%, HER2 IHC 2+ by FISH.  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   - Oncotype DX score 25.  With age more than 109, no benefit from adjuvant chemotherapy. - see above management

## 2024-05-24 NOTE — Progress Notes (Signed)
 Survivorship Care Plan visit completed.  Treatment summary reviewed and given to patient.  ASCO answers booklet reviewed and given to patient.  CARE program and Cancer Transitions discussed with patient along with other resources cancer center offers to patients and caregivers.  Patient verbalized understanding.

## 2024-05-24 NOTE — Assessment & Plan Note (Signed)
-  [employer] gene panel testing done in 2024 was negative

## 2024-05-25 ENCOUNTER — Encounter

## 2024-05-25 DIAGNOSIS — C50912 Malignant neoplasm of unspecified site of left female breast: Secondary | ICD-10-CM

## 2024-05-25 NOTE — Progress Notes (Signed)
 Daily Session Note  Patient Details  Name: Angelica Dougherty MRN: [id number] Date of Birth: 18-Jul-1971 Referring Provider:   Gattis Kass Cancer Associated Rehabilitation & Exercise from 04/13/2024 in [facility]  Referring Provider Loreatha Rodney, MD    Encounter Date: 05/25/2024  Check In:  Session Check In - 05/25/24 1249       Check-In   Supervising physician immediately available to respond to emergencies See telemetry face sheet for immediately available ER MD    Location [facility]    Staff Present Lyell Samuel, MS, Exercise Physiologist;Jaimen Melone, BS, Exercise Physiologist    Virtual Visit No    Medication changes reported     No    Fall or balance concerns reported    No    Warm-up and Cool-down Performed on first and last piece of equipment    Resistance Training Performed Yes    VAD Patient? No    PAD/SET Patient? No      Pain Assessment   Currently in Pain? No/denies    Multiple Pain Sites No             Social History   Tobacco Use  Smoking Status Never   Passive exposure: Never  Smokeless Tobacco Never    Goals Met:  Independence with exercise equipment Exercise tolerated well No report of concerns or symptoms today  Goals Unmet:  Not Applicable  Comments: Pt able to follow exercise prescription today without complaint.  Will continue to monitor for progression.    Dr. Firman Hughes is Medical Director for [facility].  Dr. Fuad Aleskerov is Medical Director for [facility] Pulmonary Rehabilitation.

## 2024-05-26 ENCOUNTER — Encounter: Payer: Self-pay | Admitting: *Deleted

## 2024-05-27 ENCOUNTER — Encounter

## 2024-05-27 ENCOUNTER — Other Ambulatory Visit: Payer: Self-pay | Admitting: Oncology

## 2024-05-27 ENCOUNTER — Encounter: Payer: Self-pay | Admitting: Oncology

## 2024-05-27 MED ORDER — EXEMESTANE 25 MG PO TABS: 25.0000 mg | ORAL_TABLET | Freq: Every day | ORAL | 0 refills | Status: DC

## 2024-05-27 NOTE — Telephone Encounter (Signed)
 Please advise on side effects from Arimidex

## 2024-06-01 ENCOUNTER — Encounter

## 2024-06-01 DIAGNOSIS — C50912 Malignant neoplasm of unspecified site of left female breast: Secondary | ICD-10-CM

## 2024-06-01 NOTE — Progress Notes (Signed)
 Daily Session Note  Patient Details  Name: Angelica Dougherty MRN: [id number] Date of Birth: March 21, 1971 Referring Provider:   Conrad Ports Cancer Associated Rehabilitation & Exercise from 04/13/2024 in [facility] Card[facility] and Pulmonary Rehab  Referring Provider Clista Bimler, MD    Encounter Date: 06/01/2024  Check In:  Session Check In - 06/01/24 1301       Check-In   Supervising physician immediately available to respond to emergencies See telemetry face sheet for immediately available ER MD    Location [facility]    Staff Present Rollene Paterson, MS, Exercise Physiologist;Kenzleigh Sedam, BS, Exercise Physiologist    Virtual Visit No    Medication changes reported     No    Fall or balance concerns reported    No    Warm-up and Cool-down Performed on first and last piece of equipment    Resistance Training Performed Yes    VAD Patient? No    PAD/SET Patient? No      Pain Assessment   Currently in Pain? No/denies    Multiple Pain Sites No             Social History   Tobacco Use  Smoking Status Never   Passive exposure: Never  Smokeless Tobacco Never    Goals Met:  Independence with exercise equipment Exercise tolerated well No report of concerns or symptoms today  Goals Unmet:  Not Applicable  Comments: Pt able to follow exercise prescription today without complaint.  Will continue to monitor for progression.    Dr. Oneil Pinal is Medical Director for [facility].  Dr. Fuad Aleskerov is Medical Director for [facility] Pulmonary Rehabilitation.

## 2024-06-02 ENCOUNTER — Inpatient Hospital Stay: Admitting: Occupational Therapy

## 2024-06-02 DIAGNOSIS — M25612 Stiffness of left shoulder, not elsewhere classified: Secondary | ICD-10-CM | POA: Diagnosis not present

## 2024-06-02 DIAGNOSIS — R293 Abnormal posture: Secondary | ICD-10-CM

## 2024-06-02 DIAGNOSIS — Z79811 Long term (current) use of aromatase inhibitors: Secondary | ICD-10-CM | POA: Diagnosis not present

## 2024-06-02 DIAGNOSIS — Z9013 Acquired absence of bilateral breasts and nipples: Secondary | ICD-10-CM | POA: Diagnosis not present

## 2024-06-02 DIAGNOSIS — M25611 Stiffness of right shoulder, not elsewhere classified: Secondary | ICD-10-CM

## 2024-06-02 DIAGNOSIS — Z1721 Progesterone receptor positive status: Secondary | ICD-10-CM | POA: Diagnosis not present

## 2024-06-02 DIAGNOSIS — Z170 Estrogen receptor positive status [ER+]: Secondary | ICD-10-CM | POA: Diagnosis not present

## 2024-06-02 DIAGNOSIS — C50812 Malignant neoplasm of overlapping sites of left female breast: Secondary | ICD-10-CM | POA: Diagnosis not present

## 2024-06-02 DIAGNOSIS — Z780 Asymptomatic menopausal state: Secondary | ICD-10-CM | POA: Diagnosis not present

## 2024-06-02 DIAGNOSIS — Z803 Family history of malignant neoplasm of breast: Secondary | ICD-10-CM | POA: Diagnosis not present

## 2024-06-02 DIAGNOSIS — Z1732 Human epidermal growth factor receptor 2 negative status: Secondary | ICD-10-CM | POA: Diagnosis not present

## 2024-06-02 NOTE — Therapy (Signed)
 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 [place]   BREAST BIOPSY Right 09/25/2023   stereo bx, calcs, RIBBON clip-path pending   BREAST BIOPSY Left 09/25/2023   US  bx, savi tag as marker, path pending   BREAST BIOPSY Right 09/25/2023   MM RT BREAST BX W LOC DEV 1ST LESION IMAGE BX SPEC STEREO GUIDE 09/25/2023 [facility]   BREAST BIOPSY Left 09/25/2023   US  LT BREAST BX W LOC DEV 1ST LESION IMG BX SPEC US  GUIDE 09/25/2023 [facility]   BREAST EXCISIONAL BIOPSY Right 1993   benign   BREAST LUMPECTOMY Right 1991   benign   BREAST RECONSTRUCTION WITH PLACEMENT OF TISSUE EXPANDER AND FLEX HD (ACELLULAR HYDRATED DERMIS) Bilateral 12/22/2023   Procedure: BREAST RECONSTRUCTION WITH PLACEMENT OF TISSUE EXPANDER AND FLEX HD (ACELLULAR HYDRATED DERMIS);  Surgeon: Lowery [name], DO;  Location: [facility];  Service: Plastics;  Laterality: Bilateral;   CESAREAN SECTION  12/16/2005    MASTECTOMY W/ SENTINEL NODE BIOPSY Bilateral 12/22/2023   Procedure: MASTECTOMY WITH SENTINEL LYMPH NODE BIOPSY, simple mastectomy;  Surgeon: Marinda Jayson KIDD, MD;  Location: [facility];  Service: General;  Laterality: Bilateral;   REMOVAL OF TISSUE EXPANDER AND PLACEMENT OF IMPLANT Bilateral 02/18/2024   Procedure: REMOVAL, TISSUE EXPANDER, BREAST, WITH IMPLANT INSERTION;  Surgeon: Lowery [name], DO;  Location: [facility];  Service: Plastics;  Laterality: Bilateral;   Patient Active Problem List   Diagnosis Date Noted   Family history of breast cancer 05/24/2024   Aromatase inhibitor use 05/24/2024   Breast cancer, right (HCC) 01/20/2024   S/P bilateral mastectomy 12/22/2023   Genetic testing 10/28/2023   Breast cancer, left (HCC) 09/30/2023   Annual physical exam 08/15/2023   Screening mammogram for breast cancer 08/15/2023   Screen for colon cancer 08/15/2023   Post-menopausal 08/15/2023   HLD (hyperlipidemia) 08/03/2007    PCP: Emilio NP  REFERRING PROVIDER: Dr Babara  REFERRING DIAG: Breast CA with bil mastectomy with reconstruction  THERAPY DIAG:  Abnormal posture  Shoulder joint stiffness, bilateral  Rationale for Evaluation and Treatment: Rehabilitation  ONSET DATE: 09/30/23  SUBJECTIVE:  SUBJECTIVE STATEMENT: I am doing so much better since as you last time.  They told me you came to talk with them and they upped my strengthening.  I do not feel tired I just feel so good.  I did talk to my work and they may be to have something for me later.  I am seeing the plastic surgery tomorrow. PERTINENT HISTORY:  Patient was diagnosed with bilateral  breast cancer -patient had bilateral mastectomy with same time reconstruction with expanders by Dr. Dillingham-12/22/2023 surgery was  done.  With the last visit no expansion was done.  3 lymph nodes removed on the right and 6 lymph nodes removed from the left.  Exchange of expanders for silicone implants was done on 02/18/2024 by Dr. Lowery.  Radiation starting next week. PATIENT GOALS:   reduce lymphedema risk and learn post op HEP.   PAIN:  Are you having pain?  Left axilla pain with shoulder motion more than 90 degrees  PRECAUTIONS: Active CA     HAND DOMINANCE: left  WEIGHT BEARING RESTRICTIONS: No  FALLS:  Has patient fallen in last 6 months? 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   LEISURE: read , spend time with daughter    OBJECTIVE:  COGNITION: Overall cognitive status: Within functional limits for tasks assessed    POSTURE:  Forward head and rounded shoulders posture  UPPER EXTREMITY AROM/PROM:  A/PROM RIGHT   eval  R 01/21/24 R 01/23/24 R 01/26/24 R  02/09/24 R 03/10/24 R 03/17/24 R 03/31/24 R 04/21/24 R 05/12/24  Shoulder extension            Shoulder flexion 165 112 112 125 125 In session 150 115 160 145 165 170  Shoulder abduction 165 95 90 95 92 ( in session 109 105 135 140 end of session 145 160 160  Shoulder internal rotation            Shoulder external rotation 85 80 85 85 84 Within functional limits behind head 90 behind head 90 90 90    (Blank rows = not tested)  A/PROM LEFT   eval L 01/21/24 L 01/23/24 L 01/26/24 L 02/09/24 L 03/10/24 L 03/17/24 L 03/31/24 L 04/21/24 L 05/12/24 L 06/02/24  Shoulder extension             Shoulder flexion 165 108 90( end of session 100) 102 100 in session 115 102 125 125 end of session 135 135 135 160  Shoulder abduction 165 90 70( end of session 90) 85 82 in session 94 105 110 110 end of session 120 125 100 160  Shoulder internal rotation             Shoulder external rotation 85 75 80 80 72 70 70 80 85 85  85    (Blank rows = not tested)  CERVICAL  AROM: All within normal limits:     UPPER EXTREMITY STRENGTH: R in range 5/5 , L 4-/5  LYMPHEDEMA ASSESSMENTS:   LANDMARK RIGHT   01/21/24 R  02/08/26 R 05/12/24 R 06/02/24  10 cm proximal to olecranon process 28.2 28.2 28.2 29.8 cm -15 cm 28.5 - 10 cm   Olecranon process 26.5  26.5 26.5 26.4  10 cm proximal to ulnar styloid process 22.1 at 15 cm      Just proximal to ulnar styloid process      Across hand at thumb web  space      At base of 2nd digit      (Blank rows = not tested)  LANDMARK LEFT   01/21/24 L 02/09/24 L 05/12/24 L  06/02/24  10 cm proximal to olecranon process 28.5 28.7. 28.7 15 cm - 29.5 10cm - 29 cm  Olecranon process 26.5 26.6 26.6 27  10  cm proximal to ulnar styloid process 22.4 at 15 cm      Just proximal to ulnar styloid process      Across hand at thumb web space      At base of 2nd digit      (Blank rows = not tested) 04/21/24 SAME as 02/09/24  WNL COMPARE TO EACH OTHER  L-DEX LYMPHEDEMA SCREENING:  SOZO NOT TO BE DONE - NOTE DONE - Pt ended up had lymph node removed on both sides. 2 on R and 6 on the L per pt      PATIENT EDUCATION:  DOne this date and review  Education details: Lymphedema signs and symptoms, and prevention and handout provided.  As well as exercises Person educated: Patient  Education method: Explanation, Demonstration, Handout Education comprehension: Patient verbalized understanding and returned demonstration 04/21/24:    Patient had on 02/18/2024 done expanders removed and silicone implant put in bilaterally.   Patient doing radiation Patient done since last visit pulleys 2 times a day with 1 session prior to radiation.  With great improvement in the left shoulder range of motion. Patient also since last time started CARE exercise program that involve cardio as well as strengthening and stretching.  Continue to have more tightness  on the left than the right. Patient to continue to do pulleys to 2 times a day 20 reps 2 to up to 2  minutes for flexion and abduction. Patient to do 1 session prior to radiation After pulleys patient can work on supine on external rotation and radiation position. Also gave her active assisted range of motion on the wall for interlocking fingers and doing shoulder flexion. Patient can do 2-3 times a day 10 reps. Discussed with patient working on conditioning prior to returning to work.  With the possibility that she would need to work on the floor. Patient to do a light yoga session.  Including child's pose. Start walking daily Cont CARE program  And follow-up with me the week after radiation stops.   05/12/24:    Patient had on 02/18/2024 done expanders removed and silicone implant put in bilaterally.   Patient radiation finished up on 05/06/2024. Patient report increased tenderness and blistering in left axilla. Limiting her shoulder flexion and abduction on the left more than the right. Patient with increased pain and discomfort past 90 degrees. Reviewed with patient some active assisted range of motion over the edge of the table but keep pain in the left axilla under 2/10. Patient can also continue with pulleys and above exercises 2-3 times a day to maintain her flexibility and motion. Patient continues to participate in CARE exercise program that involve cardio as well as strengthening and stretching. OT discussed with above program to assist patient also with some lower body strengthening to assist her to return to her job in the future..   06/02/24:    Patient had on 02/18/2024 done expanders removed and silicone implant put in bilaterally.   Patient radiation finished up on 05/06/2024. Patient returned after being seen about 3 weeks ago.   Patient show increase strength, increase endurance.  Feels stronger and much better.  Patient continues to have some stiffness tightness in the end range.   But range of motion improved greatly.   Patient can continue with pulleys in the morning  for shoulder abduction and flexion. Add also external rotation stretch in the corner.  5 reps to 10 reps hold 10 seconds  Patient continues to participate in CARE exercise program that involve cardio as well as strengthening and stretching. Did increase her strengthening  Patient is interested in the Real and heel exercise program in August.  After work. Name added to list    ASSESSMENT:  CLINICAL IMPRESSION: Patient had Bilateral mastectomy with immediate reconstruction and expanders placement was originally done 12/22/2023.SABRA  Patient had 3 lymph nodes removed from the right and 6 on the left.  Patient had 02/18/2024 expanders removed and silicone implants placed.  Patient ended radiation on 05/06/2024.  Patient return after being not seen for 3 weeks.  Did increase her strength training at CARE program that is addressing cardio as well as some strength and flexibility.  Patient reports today doing and feeling great.  Feeling stronger better endurance not as tired.  Patient seeing plastic surgeon tomorrow.  Did talk to her employer.  Possibly lighter job available.  Discussed with patient and patient interested and participating in the Real and heel exercise program that is starting in August.  Name added.    Pt will benefit from skilled therapeutic intervention to improve on the following deficits: Decreased knowledge of precautions and lymphedema education, impaired UE functional use, pain, decreased ROM, postural dysfunction.   OT treatment/interventions: ADL/self-care home management, pt/family education, therapeutic exercise,manual therapy  REHAB POTENTIAL: Good  CLINICAL DECISION MAKING: Stable/uncomplicated  EVALUATION COMPLEXITY: Low   GOALS: Goals reviewed with patient? YES  LONG TERM GOALS: (STG=LTG)    Name Target Date Goal status  1 Pt will be able to verbalize understanding of pertinent lymphedema risk reduction practices relevant to her dx specifically related to skin  care.  Baseline:  No knowledge 12 weeks ONGOING  2 Pt will be able to return demo and/or verbalize understanding of the post op HEP related to regaining shoulder ROM. Baseline:  No knowledge 12 weeks MET       4 Pt will demo she has regained full shoulder ROM and function post operatively compared to baselines.  Baseline: See objective measurements taken today. 12 wks  ONGOING     PLAN:  OT FREQUENCY/DURATION: 6 visits 12 weeks     Occupational Therapy Information for After Breast Cancer Surgery/Treatment:  Lymphedema is a swelling condition that you may be at risk for in your arm if you have lymph nodes removed from the armpit area.  After a sentinel node biopsy, the risk is approximately 5-9% and is higher after an axillary node dissection.  There is treatment available for this condition and it is not life-threatening.  Contact your physician or occupational therapist with concerns. You may begin the 4 shoulder/posture exercises (see additional sheet) when permitted by your physician (typically a week after surgery).  If you have drains, you may need to wait until those are removed before beginning range of motion exercises.  A general recommendation is to not lift your arms above shoulder height until drains are removed.  These exercises should be done to your tolerance and gently.  This is not a no pain/no gain type of recovery so listen to your body and stretch into the range of motion that you can tolerate, stopping if you have pain.  If  you are having immediate reconstruction, ask your plastic surgeon about doing exercises as he or she may want you to wait. We encourage you to watch the ABC (After Breast Cancer)  video. You will learn information related to lymphedema risk, prevention and treatment and additional exercises to regain mobility following surgery.  While undergoing any medical procedure or treatment, try to avoid blood pressure being taken or needle sticks from occurring  on the arm on the side of cancer.   This recommendation begins after surgery and continues for the rest of your life.  This may help reduce your risk of getting lymphedema (swelling in your arm). An excellent resource for those seeking information on lymphedema is the National Lymphedema Network's web site. It can be accessed at www.lymphnet.org If you notice swelling in your hand, arm or breast at any time following surgery (even if it is many years from now), please contact your doctor or occupational therapist to discuss this.  Lymphedema can be treated at any time but it is easier for you if it is treated early on.  If you feel like your shoulder motion is not returning to normal in a reasonable amount of time, please contact your surgeon or occupational therapist.  [facility] [phone]. [address], [place], [place] [address]      Ancel Peters, OTR/L,CLT 06/02/2024, 4:26 PM

## 2024-06-03 ENCOUNTER — Telehealth: Payer: Self-pay | Admitting: *Deleted

## 2024-06-03 ENCOUNTER — Ambulatory Visit: Admitting: Surgical

## 2024-06-03 ENCOUNTER — Encounter: Payer: Self-pay | Admitting: Surgical

## 2024-06-03 ENCOUNTER — Encounter

## 2024-06-03 VITALS — BP 148/76 | HR 59 | Ht 73.0 in | Wt 186.4 lb

## 2024-06-03 DIAGNOSIS — C50911 Malignant neoplasm of unspecified site of right female breast: Secondary | ICD-10-CM

## 2024-06-03 DIAGNOSIS — Z170 Estrogen receptor positive status [ER+]: Secondary | ICD-10-CM

## 2024-06-03 DIAGNOSIS — L989 Disorder of the skin and subcutaneous tissue, unspecified: Secondary | ICD-10-CM

## 2024-06-03 DIAGNOSIS — D0511 Intraductal carcinoma in situ of right breast: Secondary | ICD-10-CM

## 2024-06-03 DIAGNOSIS — Z9013 Acquired absence of bilateral breasts and nipples: Secondary | ICD-10-CM

## 2024-06-03 NOTE — Telephone Encounter (Signed)
Faxed order,demographics,insurance information,and recent office notes to [facility] for Mastectomy supplies for the patient.  Confirmation received and copy scanned into the chart.//AB/CMA

## 2024-06-03 NOTE — Progress Notes (Signed)
 Referring Provider Emilio Kelly DASEN, FNP No address on file   CC:  Chief Complaint  Patient presents with   Follow-up      Angelica Dougherty is an 53 y.o. female.  HPI: Patient is a 53 year old female here for follow-up on her bilateral breast reconstruction.  She underwent bilateral mastectomies and immediate reconstruction with Dr. Festus Dr. Lowery on 12/23/2023.  She most recently underwent expander to implant exchange with Dr. Lowery on 02/18/2024.  She completed radiation of the left breast on 05/06/2024.  She has been working with OT and cardiac rehab for increasing strength and conditioning and reports today that she has noticed significant improvement and is feeling much better.  Pathology on right side had negative margins.  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  She is continuing to recover from the radiation treatment and has ongoing skin damage to the left breast that seems to be healing very well.  She has had some skin changes on the right breast with resulting scabbing that has been occurring along the lateral breast on the inferior mastectomy flap as well as the superior flap.  She has had various areas of scabbing become present and then heal and subsequently scabbing returned in different locations.  Multiple photos have been taken and reviewed.   Patient reports today that she feels prepared to return to work, has noticed significant improvement in her range of motion and strength.    Review of Systems General: No fevers or chills  Physical Exam    06/03/2024   10:44 AM 05/24/2024    2:46 PM 05/13/2024   11:54 AM  Vitals with BMI  Height 6' 1 6' 1   Weight 186 lbs 6 oz 174 lbs 13 oz   BMI 24.6 23.07   Systolic 148 132 854  Diastolic 76 76 80  Pulse 59 81 85    General:  No acute distress,  Alert and oriented, Non-Toxic, Normal speech and  affect Bilateral breast incisions are intact and well-healed.  Left breast radiation skin changes are noted.  Hyperpigmentation and hypopigmentation both noted.  There is no obvious subcutaneous fluid collection noted of either breast.  She does have some superior pole volume loss on the left side and medial fullness present.  Cutaneous scabbing is noted along the right breast today along the right inferior mastectomy flap just adjacent to the central portion of her breast.  Scabbing from previous locations has healed without any residual lesions present.    Assessment/Plan Patient is a very pleasant 53 year old female here for follow-up on her bilateral breast reconstruction.  I am very happy that she is continuing to progress in both range of motion improvement and strengthening.  She has been working very hard with OT and cardiac rehab.  She appears well today on exam, we discussed returning to work and return to work forms were filled out for her.  We did discuss that if she returns and has difficulty performing some of her job duties, we can always reevaluate and discuss restrictions depending on her ongoing rehab and strengthening/conditioning.  In regards to the right breast scabbing lesions, we will reach out to Dr. Lowery and determine if biopsy will be necessary.  I think it would be a good idea to biopsy the areas as they have been waxing and waning in various locations of the right lateral breast.  I would like to schedule  her for telephone visit in 2 weeks to discuss any changes that she may have noticed and discuss possible biopsy.  Discussed with patient to please notify our office that she notices any changes or worsening scabbing lesions to the right breast.  Recommend continuing with recommended treatment to left breast radiation skin changes per radiation oncology.  Pictures were obtained of the patient and placed in the chart with the patient's or guardian's  permission.   Angelica Dougherty 06/03/2024, 12:33 PM

## 2024-06-08 ENCOUNTER — Encounter

## 2024-06-09 ENCOUNTER — Ambulatory Visit: Admitting: Radiation Oncology

## 2024-06-10 ENCOUNTER — Encounter: Attending: Internal Medicine

## 2024-06-10 VITALS — Ht 72.87 in | Wt 186.5 lb

## 2024-06-10 DIAGNOSIS — C50912 Malignant neoplasm of unspecified site of left female breast: Secondary | ICD-10-CM | POA: Insufficient documentation

## 2024-06-10 DIAGNOSIS — Z170 Estrogen receptor positive status [ER+]: Secondary | ICD-10-CM | POA: Insufficient documentation

## 2024-06-10 NOTE — Progress Notes (Signed)
 Angelica Dougherty   DOB October 07, 1971   Angelica Dougherty graduated today from  rehab with 13 sessions completed.  Details of the patient's exercise prescription and what She needs to do in order to continue the prescription and progress were discussed with patient.  Patient was given a copy of prescription and goals.  Patient verbalized understanding. Angelica Dougherty plans to continue to exercise by walking outside at home and looking into joining a gym.    6 Minute Walk     Row Name 04/13/24 1250 06/10/24 1245       6 Minute Walk   Phase Initial Discharge    Distance 1420 feet 1700 feet    Distance % Change -- 19.7 %    Distance Feet Change -- 280 ft    Walk Time 6 minutes 6 minutes    # of Rest Breaks 0 0    MPH 2.69 3.22    METS 4.41 2.77    RPE 6 9    Perceived Dyspnea  0 0    VO2 Peak 15.44 9.7    Symptoms No No    Resting HR 73 bpm 76 bpm    Resting BP 118/70 124/62    Resting Oxygen Saturation  96 % 95 %    Exercise Oxygen Saturation  during 6 min walk 97 % 96 %    Max Ex. HR 100 bpm 107 bpm    Max Ex. BP 140/72 122/60    2 Minute Post BP 122/74 --

## 2024-06-10 NOTE — Patient Instructions (Signed)
 Discharge Patient Instructions  Patient Details  Name: Angelica Dougherty MRN: [id number] Date of Birth: April 07, 1971 Referring Provider:  Emilio Kelly DASEN, FNP   Number of Visits: 36  Reason for Discharge:  Patient reached a stable level of exercise. Patient independent in their exercise. Patient has met program and personal goals.  Smoking History:  Social History   Tobacco Use  Smoking Status Never   Passive exposure: Never  Smokeless Tobacco Never    Diagnosis:  No diagnosis found.  Initial Exercise Prescription:  Initial Exercise Prescription - 04/13/24 1300       Date of Initial Exercise RX and Referring Provider   Date 04/13/24    Referring Provider Agrawal, Kavita, MD      Oxygen   Maintain Oxygen Saturation 88% or higher      Treadmill   MPH 2.6    Grade 0    Minutes 15    METs 2.99      Recumbant Bike   Level 4    RPM 50    Watts 63    Minutes 15    METs 4.41      NuStep   Level 4   T4 & T6   SPM 80    Minutes 15    METs 4.41      REL-XR   Level 2    Speed 50    Minutes 15    METs 4.41      T5 Nustep   Level 4    SPM 80    Minutes 15    METs 4.41      Track   Laps 38    Minutes 15    METs 3.07      Prescription Details   Frequency (times per week) 2    Duration Progress to 30 minutes of continuous aerobic without signs/symptoms of physical distress      Intensity   THRR 40-80% of Max Heartrate 110-148    Ratings of Perceived Exertion 11-13    Perceived Dyspnea 0-4      Progression   Progression Continue to progress workloads to maintain intensity without signs/symptoms of physical distress.      Resistance Training   Training Prescription Yes    Weight 3 lb    Reps 10-15          Discharge Exercise Prescription (Final Exercise Prescription Changes):  Exercise Prescription Changes - 05/06/24 1200       Home Exercise Plan   Plans to continue exercise at Home (comment)   Walking outside, weights for resistance,  might look into local gym/[employer] for indoor aerobic exercise option   Frequency Add 1 additional day to program exercise sessions.    Initial Home Exercises Provided 05/06/24          Functional Capacity:  6 Minute Walk     Row Name 04/13/24 1250 06/10/24 1245       6 Minute Walk   Phase Initial Discharge    Distance 1420 feet 1700 feet    Distance % Change -- 19.7 %    Distance Feet Change -- 280 ft    Walk Time 6 minutes 6 minutes    # of Rest Breaks 0 0    MPH 2.69 3.22    METS 4.41 2.77    RPE 6 9    Perceived Dyspnea  0 0    VO2 Peak 15.44 9.7    Symptoms No No    Resting HR 73 bpm  76 bpm    Resting BP 118/70 124/62    Resting Oxygen Saturation  96 % 95 %    Exercise Oxygen Saturation  during 6 min walk 97 % 96 %    Max Ex. HR 100 bpm 107 bpm    Max Ex. BP 140/72 122/60    2 Minute Post BP 122/74 --       Nutrition & Weight - Outcomes:  Pre Biometrics - 04/13/24 1253       Pre Biometrics   Height 6' 0.87 (1.851 m)    Weight 183 lb 11.2 oz (83.3 kg)    BMI (Calculated) 24.32    Single Leg Stand 30 seconds          Post Biometrics - 06/10/24 1250        Post  Biometrics   Height 6' 0.87 (1.851 m)    Weight 186 lb 8 oz (84.6 kg)    BMI (Calculated) 24.69    Single Leg Stand 30 seconds

## 2024-06-10 NOTE — Progress Notes (Signed)
 Daily Session Note  Patient Details  Name: Kaelynn Igo MRN: [id number] Date of Birth: [birth year] Referring Provider:   [facility] from 04/13/2024 in [facility]  Referring Provider Clista Bimler, MD    Encounter Date: 06/10/2024  Check In:  Session Check In - 06/10/24 1329       Check-In   Supervising physician immediately available to respond to emergencies See telemetry face sheet for immediately available ER MD    Staff Present Jeshurun [place] BS, Exercise Physiologist    Virtual Visit No    Medication changes reported     No    Fall or balance concerns reported    No    Warm-up and Cool-down Performed on first and last piece of equipment    Resistance Training Performed Yes    VAD Patient? No             Social History   Tobacco Use  Smoking Status Never   Passive exposure: Never  Smokeless Tobacco Never    Goals Met:  Independence with exercise equipment Exercise tolerated well Personal goals reviewed No report of concerns or symptoms today  Goals Unmet:  Not Applicable  Comments:  Ellanore graduated today from  rehab with 13 sessions completed.  Details of the patient's exercise prescription and what She needs to do in order to continue the prescription and progress were discussed with patient.  Patient was given a copy of prescription and goals.  Patient verbalized understanding. Aarin plans to continue to exercise by walking outside at home and looking into joining a gym.    Dr. Oneil Pinal is Medical Director for [facility].  Dr. Fuad Aleskerov is Medical Director for [facility] Pulmonary Rehabilitation.

## 2024-06-15 ENCOUNTER — Encounter

## 2024-06-16 ENCOUNTER — Ambulatory Visit: Admitting: Surgical

## 2024-06-16 DIAGNOSIS — Z923 Personal history of irradiation: Secondary | ICD-10-CM

## 2024-06-16 DIAGNOSIS — C50911 Malignant neoplasm of unspecified site of right female breast: Secondary | ICD-10-CM

## 2024-06-16 DIAGNOSIS — Z9013 Acquired absence of bilateral breasts and nipples: Secondary | ICD-10-CM | POA: Diagnosis not present

## 2024-06-16 DIAGNOSIS — R239 Unspecified skin changes: Secondary | ICD-10-CM | POA: Diagnosis not present

## 2024-06-16 DIAGNOSIS — D0511 Intraductal carcinoma in situ of right breast: Secondary | ICD-10-CM

## 2024-06-16 NOTE — Progress Notes (Signed)
 Angelica Dougherty is a 53 year old female status post bilateral breast reconstruction.  Recently completed radiation to her left breast.  At her last appointment she continued to have some scabbing/skin changes of the right mastectomy flap.  Discussed patient case with Dr. Lowery, recommends follow-up with Dr. Lowery for evaluation.  In regards to returning to work, she feels as if things are going well, she does notice that she is noticing some fatigue after work and being more tired than normal, but attributes this to returning back to a work routine.  She reports she has reached out to [employer] in regards to postmastectomy/post reconstruction bras.  She is otherwise doing well.  All her questions were answered to her content.  We will plan to see her in about 1 to 2 months for evaluation of the scabbing of the right side.  If it worsens prior to then, asked patient to please notify us  so we can evaluate sooner.  She feels as if it is improving at this time.  The patient gave consent to have this visit done by telemedicine / virtual visit, two identifiers  were used to identify patient. This is also consent for access the chart and treat the patient via this visit. The patient is located in [place] .  I, the provider, am at the office.  We spent 5 minutes together for the visit.  Joined by phone.

## 2024-06-17 ENCOUNTER — Encounter

## 2024-06-17 ENCOUNTER — Ambulatory Visit
Admission: RE | Admit: 2024-06-17 | Discharge: 2024-06-17 | Disposition: A | Discharge status: Home / Self Care | Source: Ambulatory Visit | Attending: Internal Medicine | Admitting: Internal Medicine

## 2024-06-17 VITALS — BP 111/89 | HR 80 | Temp 98.0°F | Resp 14 | Ht 73.0 in | Wt 189.0 lb

## 2024-06-17 DIAGNOSIS — Z170 Estrogen receptor positive status [ER+]: Secondary | ICD-10-CM | POA: Insufficient documentation

## 2024-06-17 DIAGNOSIS — C50912 Malignant neoplasm of unspecified site of left female breast: Secondary | ICD-10-CM | POA: Insufficient documentation

## 2024-06-17 NOTE — Progress Notes (Signed)
 Radiation Oncology Follow up Note  Name: Angelica Dougherty   Date:   06/17/2024 MRN:  [id number] DOB: 08/03/1971    This 53 y.o. 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  REFERRING PROVIDER: Emilio Kelly DASEN, FNP  HPI: Patient is a 53 year old female now at 1 month having completed whole breast radiation to her left reconstructed breast.  She had stage I ER/PR positive invasive mammary carcinoma seen today in routine follow-up she is doing well.  She specifically denies breast tenderness cough or bone pain..  She has been started on Aromasin  tolerating that well without side effect.  COMPLICATIONS OF TREATMENT: none  FOLLOW UP COMPLIANCE: keeps appointments   PHYSICAL EXAM:  BP 111/89   Pulse 80   Temp 98 F (36.7 C)   Resp 14   Ht 6' 1 (1.854 m)   Wt 189 lb (85.7 kg)   LMP 06/08/2006 (Approximate) Comment: Hysterectomy  BMI 24.94 kg/m  Patient has bilateral reconstructed breasts.  Cosmetic result is good.  Skin is still hyperpigmentation left reconstructed breast.  It is improving.  No dominant masses noted in either breast no axillary or supraclavicular adenopathy is appreciated.  Well-developed well-nourished patient in NAD. HEENT reveals PERLA, EOMI, discs not visualized.  Oral cavity is clear. No oral mucosal lesions are identified. Neck is clear without evidence of cervical or supraclavicular adenopathy. Lungs are clear to A&P. Cardiac examination is essentially unremarkable with regular rate and rhythm without murmur rub or thrill. Abdomen is benign with no organomegaly or masses noted. Motor sensory and DTR levels are equal and symmetric in the upper and lower extremities. Cranial nerves II through XII are grossly intact. Proprioception is intact. No peripheral adenopathy or edema is identified. No motor or  sensory levels are noted. Crude visual fields are within normal range.  RADIOLOGY RESULTS: No current films to review  PLAN: Present time the patient is doing well 1 month out from whole breast radiation she started Aromasin  and is tolerating it well.  I have asked to see her back in 6 months for follow-up.  Patient knows to call with any concerns.  I would like to take this opportunity to thank you for allowing me to participate in the care of your patient.SABRA Marcey Penton, MD

## 2024-06-21 ENCOUNTER — Other Ambulatory Visit: Payer: Self-pay

## 2024-06-21 MED ORDER — EXEMESTANE 25 MG PO TABS: 25.0000 mg | ORAL_TABLET | Freq: Every day | ORAL | 0 refills | Status: DC

## 2024-06-22 ENCOUNTER — Encounter

## 2024-06-24 ENCOUNTER — Encounter

## 2024-06-29 ENCOUNTER — Encounter

## 2024-07-01 ENCOUNTER — Encounter

## 2024-08-16 ENCOUNTER — Telehealth: Payer: Self-pay

## 2024-08-16 DIAGNOSIS — Z1211 Encounter for screening for malignant neoplasm of colon: Secondary | ICD-10-CM

## 2024-08-16 NOTE — Telephone Encounter (Signed)
 Pt contacted office to reschedule her colonoscopy from 08/23/24 to a Friday.  Informed her that we do not have any Friday's available and my next date available will be 11/30/24.  She has been rescheduled to 11/30/24.  Endoscopy dept notified.  Instructions updated.  Thanks,  [place], CMA

## 2024-08-31 ENCOUNTER — Inpatient Hospital Stay: Attending: Oncology

## 2024-08-31 ENCOUNTER — Inpatient Hospital Stay (HOSPITAL_BASED_OUTPATIENT_CLINIC_OR_DEPARTMENT_OTHER): Admitting: Oncology

## 2024-08-31 ENCOUNTER — Encounter: Payer: Self-pay | Admitting: Oncology

## 2024-08-31 VITALS — BP 126/74 | HR 75 | Temp 96.8°F | Resp 18 | Wt 188.2 lb

## 2024-08-31 DIAGNOSIS — Z79811 Long term (current) use of aromatase inhibitors: Secondary | ICD-10-CM

## 2024-08-31 DIAGNOSIS — Z1721 Progesterone receptor positive status: Secondary | ICD-10-CM | POA: Insufficient documentation

## 2024-08-31 DIAGNOSIS — C50212 Malignant neoplasm of upper-inner quadrant of left female breast: Secondary | ICD-10-CM | POA: Insufficient documentation

## 2024-08-31 DIAGNOSIS — Z170 Estrogen receptor positive status [ER+]: Secondary | ICD-10-CM | POA: Insufficient documentation

## 2024-08-31 DIAGNOSIS — C50912 Malignant neoplasm of unspecified site of left female breast: Secondary | ICD-10-CM

## 2024-08-31 DIAGNOSIS — C50911 Malignant neoplasm of unspecified site of right female breast: Secondary | ICD-10-CM

## 2024-08-31 DIAGNOSIS — Z9013 Acquired absence of bilateral breasts and nipples: Secondary | ICD-10-CM | POA: Diagnosis not present

## 2024-08-31 DIAGNOSIS — Z90710 Acquired absence of both cervix and uterus: Secondary | ICD-10-CM | POA: Diagnosis not present

## 2024-08-31 DIAGNOSIS — Z17411 Hormone receptor positive with human epidermal growth factor receptor 2 negative status: Secondary | ICD-10-CM | POA: Diagnosis not present

## 2024-08-31 LAB — CBC WITH DIFFERENTIAL (CANCER CENTER ONLY)
Abs Immature Granulocytes: 0.02 K/uL (ref 0.00–0.07)
Basophils Absolute: 0 K/uL (ref 0.0–0.1)
Basophils Relative: 0 %
Eosinophils Absolute: 0.1 K/uL (ref 0.0–0.5)
Eosinophils Relative: 3 %
HCT: 39.9 % (ref 36.0–46.0)
Hemoglobin: 13.6 g/dL (ref 12.0–15.0)
Immature Granulocytes: 0 %
Lymphocytes Relative: 34 %
Lymphs Abs: 1.8 K/uL (ref 0.7–4.0)
MCH: 30.8 pg (ref 26.0–34.0)
MCHC: 34.1 g/dL (ref 30.0–36.0)
MCV: 90.5 fL (ref 80.0–100.0)
Monocytes Absolute: 0.4 K/uL (ref 0.1–1.0)
Monocytes Relative: 8 %
Neutro Abs: 2.9 K/uL (ref 1.7–7.7)
Neutrophils Relative %: 55 %
Platelet Count: 275 K/uL (ref 150–400)
RBC: 4.41 MIL/uL (ref 3.87–5.11)
RDW: 12.8 % (ref 11.5–15.5)
WBC Count: 5.3 K/uL (ref 4.0–10.5)
nRBC: 0 % (ref 0.0–0.2)

## 2024-08-31 LAB — CMP (CANCER CENTER ONLY)
ALT: 18 U/L (ref 0–44)
AST: 26 U/L (ref 15–41)
Albumin: 4.5 g/dL (ref 3.5–5.0)
Alkaline Phosphatase: 57 U/L (ref 38–126)
Anion gap: 7 (ref 5–15)
BUN: 15 mg/dL (ref 6–20)
CO2: 25 mmol/L (ref 22–32)
Calcium: 9.5 mg/dL (ref 8.9–10.3)
Chloride: 105 mmol/L (ref 98–111)
Creatinine: 0.96 mg/dL (ref 0.44–1.00)
GFR, Estimated: >60 mL/min (ref >60)
Glucose, Bld: 86 mg/dL (ref 70–99)
Potassium: 4.1 mmol/L (ref 3.5–5.1)
Sodium: 137 mmol/L (ref 135–145)
Total Bilirubin: 0.7 mg/dL (ref 0.0–1.2)
Total Protein: 7.8 g/dL (ref 6.5–8.1)

## 2024-08-31 MED ORDER — EXEMESTANE 25 MG PO TABS: 25.0000 mg | ORAL_TABLET | Freq: Every day | ORAL | 1 refills | Status: AC

## 2024-08-31 NOTE — Assessment & Plan Note (Addendum)
-   s/p right breast mastectomy.  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  No role of adjuvant chemotherapy. - Continue endocrine therapy with Aromasin  25 mg daily.

## 2024-08-31 NOTE — Assessment & Plan Note (Signed)
 Baseline DEXA May 2025 - normal.  Recommend calcium 1200mg  and Vitamin D  supplementation.

## 2024-08-31 NOTE — Assessment & Plan Note (Addendum)
-  s/p bilateral mastectomies with immediate reconstruction by Dr. Marinda and Dr. Lowery on 12/23/2023.   Left breast IDC focal lobular features.  6 lymph nodes removed.  1 lymph node positive for individual tumor cells.  Tumor size 16 mm, overall grade 3, anterior superior margins involved, ER 90% positive, PR 90% positive, HER2 1+ IHC negative.  pT1 pN0 (I+), - Oncotype DX score 13.  No role for adjuvant chemo.   - close margin, not able to re-excise per Surgery.  - s/p postmastectomy radiation on the left side.  - Postmenopausal state - FSH>40, Estradiol  <6   Adjuvant endocrine therapy with AI She was initially prescribed with Arimidex  which she cannot tolerate.  Switched to Aromasin  25 mg daily and she tolerates well.  Continue current regimen.

## 2024-08-31 NOTE — Progress Notes (Signed)
 Hematology/Oncology Progress note [phone] [phone]        REFERRING PROVIDER: Emilio Dougherty DASEN, FNP    CHIEF COMPLAINTS/PURPOSE OF CONSULTATION:  History of bilateral stage I breast cancer.   ASSESSMENT & PLAN:   Breast cancer, left [facility]) -s/p bilateral mastectomies with immediate reconstruction by Dr. Marinda and Dr. Lowery on 12/23/2023.   Left breast IDC focal lobular features.  6 lymph nodes removed.  1 lymph node positive for individual tumor cells.  Tumor size 16 mm, overall grade 3, anterior superior margins involved, ER 90% positive, PR 90% positive, HER2 1+ IHC negative.  pT1 pN0 (I+), - Oncotype DX score 13.  No role for adjuvant chemo.   - close margin, not able to re-excise per Surgery.  - s/p postmastectomy radiation on the left side.  - Postmenopausal state - FSH>40, Estradiol  <6   Adjuvant endocrine therapy with AI She was initially prescribed with Arimidex  which she cannot tolerate.  Switched to Aromasin  25 mg daily and she tolerates well.  Continue current regimen.    Aromatase inhibitor use Baseline DEXA May 2025 - normal.  Recommend calcium 1200mg  and Vitamin D  supplementation.  Breast cancer, right [facility]) - s/p right breast mastectomy.  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  No role of adjuvant chemotherapy. - Continue endocrine therapy with Aromasin  25 mg daily.   Orders Placed This Encounter  Procedures   CBC with Differential (Cancer Center Only)    Standing Status:   Future    Expected Date:   02/28/2025    Expiration Date:   05/29/2025   CMP (Cancer Center only)    Standing Status:   Future    Expected Date:   02/28/2025    Expiration Date:   05/29/2025   Follow up in 3 months  All questions were answered. The patient knows to call the clinic with any problems, questions or concerns.  Zelphia Cap,  MD, PhD [facility] Hematology Oncology 08/31/2024    HISTORY OF PRESENTING ILLNESS:  Angelica Dougherty 53 y.o. female presents to establish care for breast cancer.  Patient previously followed up with Dr.Agrawal. Patient switched care to me on 05/24/2024  Extensive medical record review was performed by me   I have reviewed her chart and materials related to her cancer extensively and collaborated history with the patient. Summary of oncologic history is as follows: Oncology History  Breast cancer, left (HCC)  08/26/2023 Mammogram   Screening mammogram IMPRESSION: Further evaluation is suggested for possible calcifications in the right breast.   Further evaluation is suggested for possible mass in the left breast.  Diagnostic mammogram and ultrasound FINDINGS: RIGHT breast: In the upper central right breast at middle depth, there are amorphous and round calcifications in a linear distribution spanning 1.9 cm. These are new compared to most recent mammogram 12/31/2017. No suspicious mass or other findings in the right breast.   LEFT breast: The previously noted possible mass seen in the upper posterior left breast on MLO view only, overlying the pectoralis muscle, persists on additional views as a 1.8 cm irregular mass with indistinct margins in the associated architectural distortion. On tomosynthesis series, the mass localizes to the medial breast. No definite mammographic correlate seen on [facility] M view, likely due to the far posterior location. No suspicious calcifications or other suspicious findings in the left  breast.   Targeted ultrasound of the left breast at the 11:30 position 18 cm from the nipple demonstrates a 2.1 x 1.2 x 1.2 cm irregular hypoechoic mass with indistinct and angular margins, corresponding to the mammographic finding.   Targeted ultrasound of the left axilla demonstrates lymph nodes with normal morphology.   IMPRESSION: 1. Highly suspicious 2.1  cm mass at the left breast 11:30 position. 2. Suspicious linear calcifications in the upper central right breast. 3. No left axillary lymphadenopathy.     09/25/2023 Pathology Results   FINAL DIAGNOSIS       1. Breast, right, needle core biopsy, upper middle depth, ribbon clip :      - DUCTAL CARCINOMA IN SITU, INTERMEDIATE GRADE (2)      - CANNOT RULE OUT FOCAL MICROINVASION      - NECROSIS: PRESENT, COMEDO-TYPE      - CALCIFICATIONS: PRESENT      - DCIS LENGTH: 0.22 CM      - SEE NOTE       2. 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      - TUBULE FORMATION: SCORE 3      - NUCLEAR PLEOMORPHISM: SCORE 3      - MITOTIC COUNT: SCORE 2      - TOTAL SCORE: 8      - OVERALL GRADE: 3      - LYMPHOVASCULAR INVASION: NOT IDENTIFIED      - CANCER LENGTH: 1.1 CM      - CALCIFICATIONS: NOT IDENTIFIED      - SEE NOTE  Results: IMMUNOHISTOCHEMICAL AND MORPHOMETRIC ANALYSIS PERFORMED MANUALLY Estrogen Receptor:  95%, POSITIVE, STRONG STAINING INTENSITY REFERENCE RANGE ESTROGEN RECEPTOR NEGATIVE     0% POSITIVE       =>1% All controls stained appropriately Stuart Come, Zhaoli, Sports administrator, International aid/development worker ( Signed 10 21 2024) Breast, left, needle core biopsy, 11:30 o'clock, 18cmfn savi scout PROGNOSTIC INDICATORS  Results: IMMUNOHISTOCHEMICAL AND MORPHOMETRIC ANALYSIS PERFORMED MANUALLY The tumor cells are negative for Her2 (1+). Estrogen Receptor:  90%, POSITIVE, STRONG STAINING INTENSITY Progesterone Receptor:  90%, POSITIVE, STRONG STAINING INTENSITY REFERENCE RANGE ESTROGEN RECEPTOR NEGATIVE     0% POSITIVE       =>1% REFERENCE RANGE PROGESTERONE RECEPTOR NEGATIVE     0% POSITIVE        =>1%    09/30/2023 Cancer Staging   Staging form: Breast, AJCC 8th Edition - Clinical: Stage IIA (cT2, cN0, cM0, G3, ER+, PR+, HER2-) - Signed by Agrawal, Kavita, MD on 09/30/2023 Histologic  grading system: 3 grade system    Genetic Testing   Negative genetic testing on the [facility] BRCAPlus+CancerNext-Expanded+RNA panel. The final report date is 10/27/2023.  The CancerNext-Expanded gene panel offered by [facility] and includes sequencing, rearrangement, and RNA analysis for the following 71 genes: AIP, ALK, APC, ATM, BAP1, BARD1, BMPR1A, BRCA1, BRCA2, BRIP1, CDC73, CDH1, CDK4, CDKN1B, CDKN2A, CHEK2, DICER1, FH, FLCN, KIF1B, LZTR1, MAX, MEN1, MET, MLH1, MSH2, MSH6, MUTYH, NF1, NF2, NTHL1, PALB2,  PHOX2B, PMS2, POT1, PRKAR1A, PTCH1, PTEN, RAD51C, RAD51D, RB1, RET, SDHA, SDHAF2, SDHB, SDHC, SDHD, SMAD4, SMARCA4, SMARCB1, SMARCE1, STK11, SUFU, TMEM127, TP53, TSC1, TSC2 and VHL (sequencing and deletion/duplication); AXIN2, CTNNA1, EGFR, EGLN1, HOXB13, KIT, MITF, MSH3, PDGFRA, POLD1 and POLE (sequencing only); EPCAM and GREM1 (deletion/duplication only).   10/08/2023 Breast MRI   MRI bilateral breast-  IMPRESSION: 1. Biopsy-proven DCIS involving the UPPER  INNER QUADRANT of the RIGHT breast at middle depth and biopsy-proven IDC with lobular features involving the UPPER INNER QUADRANT of the LEFT breast at posterior depth. 2. Suspicious 1.1 cm mass in the upper RIGHT breast at posterior depth, directly posterior to the biopsy-proven DCIS. 3. Indeterminate 2.0 cm non-mass enhancement involving the LOWER OUTER QUADRANT of the RIGHT breast at anterior depth. 4. Indeterminate 1.2 cm mass in the lower LEFT breast at posterior depth, adjacent to the chest wall. 5. 2 foci of indeterminate linear non-mass enhancement in the LEFT breast; 1.9 cm NME in the LOWER INNER QUADRANT middle depth and 2.2 cm NME in the lower breast at middle depth. 6. Solitary suspicious enhancing lesion involving the lower sternum. 7. Benign enhancing mass in the outer LEFT breast at the near 3 o'clock location (stable over prior mammograms dating back to 2019).   RECOMMENDATION: 1. MRI-directed second-look  ultrasound of both breasts to see if the masses described above can be identified and possibly biopsied if suspicious in appearance. 2. MRI guided core needle biopsy of the indeterminate non-mass enhancement involving the LOWER OUTER QUADRANT of the RIGHT breast and the 2 foci of non-mass enhancement in the LOWER INNER QUADRANT of the LEFT breast and the lower LEFT breast.     12/22/2023 Cancer Staging   Staging form: Breast, AJCC 8th Edition - Pathologic stage from 12/22/2023: Stage IA (pT1c, pN0(i+)(sn), cM0, G3, ER+, PR+, HER2-) - Signed by Agrawal, Kavita, MD on 01/20/2024 Stage prefix: Initial diagnosis Method of lymph node assessment: Sentinel lymph node biopsy Multigene prognostic tests performed: Oncotype DX Histologic grading system: 3 grade system    Pathology Results            12/23/2023 Definitive Surgery   S/p bilateral mastectomies with immediate reconstruction with Dr. Marinda and Dr. Lowery    Pathology Results   FINAL DIAGNOSIS       1. Breast, simple mastectomy, left :      - INVASIVE DUCTAL CARCINOMA WITH FOCAL LOBULAR FEATURES      - SEE ONCOLOGY TABLE AND NOTE       2. Lymph node, sentinel, biopsy, #2 right :      - ONE LYMPH NODE, NEGATIVE FOR METASTATIC CARCINOMA (0/1)      - SEE ONCOLOGY TABLE AND NOTE       3. Lymph node, sentinel, biopsy, #3 right :      - ONE LYMPH NODE, NEGATIVE FOR METASTATIC CARCINOMA (0/1)      - SEE ONCOLOGY TABLE AND NOTE       4. Lymph node, sentinel, biopsy, #1left :      - FOCAL BREAST PARENCHYMA WITH MARKED THERMAL ARTIFACT      - BENIGN FIBROADIPOSE TISSUE AND FOCAL MUSCLE      - LYMPHOID TISSUE IS NOT IDENTIFIED      - NEGATIVE FOR DEFINITIVE MALIGNANCY      - SEE ONCOLOGY TABLE AND NOTE       5. Lymph node, sentinel, biopsy, #2 left :      - FOCAL BREAST PARENCHYMA WITH SECRETORY CHANGE, ADENOSIS, FIBROCYSTIC CHANGE      AND THERMAL ARTIFACT      - BENIGN FIBROADIPOSE TISSUE AND SCANT MUSCLE      -  LYMPHOID TISSUE IS NOT IDENTIFIED      - NEGATIVE FOR MALIGNANCY      - SEE NOTE AND ONCOLOGY TABLE       6. Lymph node, sentinel, biopsy, #3 left :      -  BREAST PARENCHYMA WITH FIBROCYSTIC CHANGES SECRETORY CHANGE, ADENOSIS AND      MARKED THERMAL ARTIFACT      - BENIGN FIBROADIPOSE TISSUE      - LYMPHOID TISSUE IS NOT IDENTIFIED      - NEGATIVE FOR DEFINITIVE MALIGNANCY      - SEE ONCOLOGY TABLE AND NOTE       7. Lymph node, sentinel, biopsy, #4 left :      - FOCAL BREAST PARENCHYMA WITH SECRETORY CHANGE, ADENOSIS  AND THERMAL ARTIFACT      - BENIGN FIBROADIPOSE TISSUE      - LYMPHOID TISSUE IS NOT IDENTIFIED      - NEGATIVE FOR MALIGNANCY      - SEE NOTE AND ONCOLOGY TABLE       8. Lymph node, sentinel, biopsy, #5 left :      - FOCAL BREAST PARENCHYMA WITH SECRETORY CHANGE AND THERMAL ARTIFACT      - BENIGN FIBROADIPOSE TISSUE AND SCANT MUSCLE      - LYMPHOID TISSUE IS NOT IDENTIFIED      - NEGATIVE FOR MALIGNANCY      - SEE NOTE AND ONCOLOGY TABLE       9. Lymph node, sentinel, biopsy, #6 left :      - ONE LYMPH NODE POSITIVE FOR INDIVIDUAL TUMOR CELLS (0/1)      - SEE ONCOLOGY TABLE AND NOTE       10. Breast, simple mastectomy, Right :      - MULTIFOCAL, INVASIVE CARCINOMA WITH MIXED DUCTAL AND LOBULAR FEATURES      - INVASIVE DUCTAL CARCINOMA AND DUCTAL CARCINOMA IN SITU (DCIS)      - SEE ONCOLOGY TABLE AND NOTE       11. Lymph node, sentinel, biopsy, #1right :      - ONE LYMPH NODE, NEGATIVE FOR METASTATIC CARCINOMA (0/1)      - SEE ONCOLOGY TABLE AND NOTE       Diagnosis Note : - 7.      INVASIVE CARCINOMA OF THE BREAST:  Resection      Procedure: Mastectomy      Specimen Laterality: Left      Histologic Type: Invasive ductal carcinoma with focal lobular features      Histologic Grade:      Glandular (Acinar)/Tubular Differentiation: 3      Nuclear Pleomorphism: 3      Mitotic Rate: 2      Overall Grade: 3      Tumor Size: 16 mm      Ductal Carcinoma In  Situ: Not identified      Tumor Extent: N/A      Lymphatic and/or Vascular Invasion: Not identified      Treatment Effect in the Breast: No known presurgical therapy      Margins:      Distance from Closest Margin (mm): Anterior/Superior (Involved); Posterior (< 1      mm)      Specify Closest Margin (required only if <3mm): see above      Regional Lymph Nodes:      Number of Lymph Nodes Examined: 1      Number of Sentinel Nodes Examined: 1      Number of Lymph Nodes with Macrometastases (>2 mm): 0      Number of Lymph Nodes with Micrometastases: 0      Number of Lymph Nodes with Isolated Tumor Cells (=0.2 mm or =200 cells): 1      Size  of Largest Metastatic Deposit (mm): Less than or equal to 0.2 mm, 200 cells      or less      Extranodal Extension: N/A      Distant Metastasis:   Estrogen Receptor: 90%, positive, strong staining intensity       Progesterone Receptor: 90%, positive, strong staining intensity       HER2: Negative (1+)       Ki-67: Not performed       Pathologic Stage Classification (pTNM, AJCC 8th Edition): pT1c, pN0 (i+) (sn)   Specimen Laterality: Right      Histologic Type: Invasive carcinoma with mixed ductal and lobular features (see      comment below)      Histologic Grade:      Glandular (Acinar)/Tubular Differentiation: 3      Nuclear Pleomorphism: 3      Mitotic Rate: 2      Overall Grade: 3      Tumor Size: 12 mm      Ductal Carcinoma In Situ: Present (ribbon clip site, see comment below)      Tumor Extent: N/A      Lymphatic and/or Vascular Invasion: Not identified      Treatment Effect in the Breast: No known presurgical therapy      Margins: All margins negative for invasive carcinoma or carcinoma in-situ      Distance from Closest Margin (mm): 3 mm      Specify Closest Margin (required only if  <94mm): Anterior/Inferior      DCIS Margins: Uninvolved by DCIS      Distance from Closest Margin (mm): 9 mm      Specify Closest Margin (required  only if <3mm): Anterior/superior      Regional Lymph Nodes:      Number of Lymph Nodes Examined: 3      Number of Sentinel Nodes Examined: 3      Number of Lymph Nodes with Macrometastases (>2 mm): 0      Number of Lymph Nodes with Micrometastases: 0      Number of Lymph Nodes with Isolated Tumor Cells (=0.2 mm or =200 cells): 0      Size of Largest Metastatic Deposit (mm): N/A      Extranodal Extension: N/A      Distant Metastasis:      Distant Site(s) Involved: N/A      Breast Biomarker Testing Performed on Previous Biopsy:      Testing Performed on Case Number: [id number] (part 1, ribbon clip) for DCIS      Estrogen Receptor: 95%, positive, strong staining intensity      Progesterone Receptor: Not performed      HER2: Not performed      Ki-67: Not performed      Testing Performed on Case Number: [id number] (part 1, barbell clip)      Estrogen Receptor: 95%, positive, strong staining intensity      Progesterone Receptor: 95%, positive, strong staining intensity      HER2: FISH (negative), equivocal by IHC (2+)      Ki-67: Not performed      Pathologic Stage Classification (pTNM, AJCC 8th Edition): pT1c, pN0 (sn)      Representative Tumor Block:  8D, 8K      Comment(s): Biopsy site changes are present in both clip sites.  Multifocal      invasive carcinoma is present:      Lesion A (Ribbon Clip): Invasive ductal carcinoma, grade 3 (3, 3,  2); ductal      carcinoma in situ (DCIS), cribriform and solid types with focal necrosis, grade      2      Size of invasive component: 8 mm (2 blocks, 4 mm each)      Margins: All margins negative for invasive carcinoma and carcinoma in situ      Closest margin: Anterior/superior is 9.0 mm from DCIS      Lesion B (Barbell Clip): Invasive carcinoma with mixed ductal and lobular      features, grade 3      Atypical lobular hyperplasia (ALH)      Size of invasive component: 12 mm (3 blocks, 4 mm each)      Margins: All margins negative for  invasive carcinoma      Closest margin: Anterior/Inferior is 3.0 mm from invasive carcinoma      Lesion C (no clip identified): Invasive carcinoma with mixed ductal and lobular      features, grade 3      Atypical lobular hyperplasia (ALH)      Size of invasive component: 12 mm (3 blocks, 4 mm each)      Margins: All margins negative for invasive carcinoma      03/31/2024 - 05/04/2024 Radiation Therapy   Left breast post mastectomy radiation.    05/24/2024 -  Anti-estrogen oral therapy   Started on Arimidex  1 mg daily.  Not able to tolerate due to neck pain and headaches. 05/27/2024 switched to Aromasin  25 mg daily.     Breast cancer, right (HCC)  12/22/2023 Cancer Staging   Staging form: Breast, AJCC 8th Edition - Pathologic stage from 12/22/2023: pT1c, pN0, cM0, ER+, PR+, HER2- - Signed by Agrawal, Kavita, MD on 01/20/2024 Stage prefix: Initial diagnosis Nuclear grade: G3 Multigene prognostic tests performed: Oncotype DX   01/20/2024 Initial Diagnosis   Breast cancer, right (HCC)   05/24/2024 -  Anti-estrogen oral therapy   Started on Arimidex  1 mg daily.  Not able to tolerate due to neck pain and headaches. 05/27/2024 switched to Aromasin  25 mg daily.      Today patient reports feeling well.  She is currently on Aromasin  25 mg.  Overall tolerated well.  Some hot flash.    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 [facility]   BREAST BIOPSY Right 09/25/2023    stereo bx, calcs, RIBBON clip-path pending   BREAST BIOPSY Left 09/25/2023   US  bx, savi tag as marker, path pending   BREAST BIOPSY Right 09/25/2023   MM RT BREAST BX W LOC DEV 1ST LESION IMAGE BX SPEC STEREO GUIDE 09/25/2023 [facility]   BREAST BIOPSY Left 09/25/2023   US  LT BREAST BX W LOC DEV 1ST LESION IMG BX SPEC US  GUIDE 09/25/2023 [facility]   BREAST EXCISIONAL BIOPSY Right 1993   benign   BREAST LUMPECTOMY Right 1991   benign   BREAST RECONSTRUCTION WITH PLACEMENT OF TISSUE EXPANDER AND FLEX HD (ACELLULAR HYDRATED DERMIS) Bilateral 12/22/2023  Procedure: BREAST RECONSTRUCTION WITH PLACEMENT OF TISSUE EXPANDER AND FLEX HD (ACELLULAR HYDRATED DERMIS);  Surgeon: Lowery [name], DO;  Location: [facility];  Service: Plastics;  Laterality: Bilateral;   CESAREAN SECTION  12/16/2005   MASTECTOMY W/ SENTINEL NODE BIOPSY Bilateral 12/22/2023   Procedure: MASTECTOMY WITH SENTINEL LYMPH NODE BIOPSY, simple mastectomy;  Surgeon: Marinda Jayson KIDD, MD;  Location: [facility];  Service: General;  Laterality: Bilateral;   REMOVAL OF TISSUE EXPANDER AND PLACEMENT OF IMPLANT Bilateral 02/18/2024   Procedure: REMOVAL, TISSUE EXPANDER, BREAST, WITH IMPLANT INSERTION;  Surgeon: Lowery [name], DO;  Location: [facility];  Service: Plastics;  Laterality: Bilateral;    SOCIAL HISTORY: Social History   Socioeconomic History   Marital status: Single    Spouse name: Not on file   Number of children: 1   Years of education: Not on file   Highest education level: Some college, no degree  Occupational History   Not on file  Tobacco Use   Smoking status: Never    Passive exposure: Never   Smokeless tobacco: Never  Vaping Use   Vaping status: Never Used  Substance and Sexual Activity   Alcohol use: No   Drug use: No   Sexual activity: Not Currently  Other Topics Concern   Not on file  Social History Narrative   Not on file   Social Drivers of Health   Financial  Resource Strain: Low Risk  (08/15/2023)   Overall Financial Resource Strain (CARDIA)    Difficulty of Paying Living Expenses: Not very hard  Food Insecurity: No Food Insecurity (12/22/2023)   Hunger Vital Sign    Worried About Running Out of Food in the Last Year: Never true    Ran Out of Food in the Last Year: Never true  Transportation Needs: No Transportation Needs (12/22/2023)   PRAPARE - Administrator, Civil Service (Medical): No    Lack of Transportation (Non-Medical): No  Physical Activity: Insufficiently Active (08/15/2023)   Exercise Vital Sign    Days of Exercise per Week: 2 days    Minutes of Exercise per Session: 20 min  Stress: No Stress Concern Present (08/15/2023)   [employer] of Occupational Health - Occupational Stress Questionnaire    Feeling of Stress : Not at all  Social Connections: Moderately Integrated (08/15/2023)   Social Connection and Isolation Panel    Frequency of Communication with Friends and Family: More than three times a week    Frequency of Social Gatherings with Friends and Family: Once a week    Attends Religious Services: More than 4 times per year    Active Member of [employer] or Organizations: Yes    Attends Engineer, structural: More than 4 times per year    Marital Status: Never married  Intimate Partner Violence: Not At Risk (12/22/2023)   Humiliation, Afraid, Rape, and Kick questionnaire    Fear of Current or Ex-Partner: No    Emotionally Abused: No    Physically Abused: No    Sexually Abused: No    FAMILY HISTORY: Family History  Problem Relation Age of Onset   Asthma Mother    Cancer Maternal Uncle        back cancer at 46   Breast cancer Paternal Aunt        dx 30s, bilateral   Breast cancer Paternal Aunt        dx 7s   Breast cancer Cousin  dx 40s    ALLERGIES:  is allergic to codeine.  MEDICATIONS:  Current Outpatient Medications  Medication Sig Dispense Refill   acetaminophen  (TYLENOL ) 500 MG  tablet Take 500 mg by mouth every 6 (six) hours as needed.     diphenhydrAMINE (BENADRYL) 25 MG tablet Take 25 mg by mouth daily as needed for allergies.     exemestane  (AROMASIN ) 25 MG tablet Take 1 tablet (25 mg total) by mouth daily after breakfast. 90 tablet 1   No current facility-administered medications for this visit.    Review of Systems  Constitutional:  Negative for appetite change, chills, fatigue and fever.  HENT:   Negative for hearing loss and voice change.   Eyes:  Negative for eye problems.  Respiratory:  Negative for chest tightness and cough.   Cardiovascular:  Negative for chest pain.  Gastrointestinal:  Negative for abdominal distention, abdominal pain and blood in stool.  Endocrine: Positive for hot flashes.  Genitourinary:  Negative for difficulty urinating and frequency.   Musculoskeletal:  Negative for arthralgias.  Skin:  Negative for itching and rash.  Neurological:  Negative for extremity weakness.  Hematological:  Negative for adenopathy.  Psychiatric/Behavioral:  Negative for confusion.      PHYSICAL EXAMINATION: ECOG PERFORMANCE STATUS: 0 - Asymptomatic  Vitals:   08/31/24 1452  BP: 126/74  Pulse: 75  Resp: 18  Temp: (!) 96.8 F (36 C)  SpO2: 98%   Filed Weights   08/31/24 1452  Weight: 188 lb 3.2 oz (85.4 kg)    Physical Exam Constitutional:      General: She is not in acute distress.    Appearance: She is not diaphoretic.  HENT:     Head: Normocephalic and atraumatic.  Eyes:     General: No scleral icterus. Cardiovascular:     Rate and Rhythm: Normal rate and regular rhythm.  Pulmonary:     Effort: Pulmonary effort is normal. No respiratory distress.     Breath sounds: Normal breath sounds.  Abdominal:     General: There is no distension.     Palpations: Abdomen is soft.     Tenderness: There is no abdominal tenderness.  Musculoskeletal:        General: Normal range of motion.     Cervical back: Normal range of motion and  neck supple.  Skin:    General: Skin is warm and dry.     Findings: No erythema.  Neurological:     Mental Status: She is alert and oriented to person, place, and time. Mental status is at baseline.     Motor: No abnormal muscle tone.  Psychiatric:        Mood and Affect: Mood and affect normal.      LABORATORY DATA:  I have reviewed the data as listed    Latest Ref Rng & Units 08/31/2024    2:25 PM 04/30/2024   11:59 AM 04/16/2024   11:51 AM  CBC  WBC 4.0 - 10.5 K/uL 5.3  4.7  5.5   Hemoglobin 12.0 - 15.0 g/dL 86.3  86.7  87.2   Hematocrit 36.0 - 46.0 % 39.9  39.1  37.3   Platelets 150 - 400 K/uL 275  271  259       Latest Ref Rng & Units 08/31/2024    2:25 PM 04/30/2024   11:59 AM 01/20/2024    3:35 PM  CMP  Glucose 70 - 99 mg/dL 86  94  85   BUN 6 -  20 mg/dL 15  11  10    Creatinine 0.44 - 1.00 mg/dL 9.03  9.17  9.35   Sodium 135 - 145 mmol/L 137  138  138   Potassium 3.5 - 5.1 mmol/L 4.1  4.4  4.3   Chloride 98 - 111 mmol/L 105  105  101   CO2 22 - 32 mmol/L 25  25  27    Calcium 8.9 - 10.3 mg/dL 9.5  9.4  9.5   Total Protein 6.5 - 8.1 g/dL 7.8  7.5  7.9   Total Bilirubin 0.0 - 1.2 mg/dL 0.7  0.9  1.1   Alkaline Phos 38 - 126 U/L 57  51  49   AST 15 - 41 U/L 26  21  20    ALT 0 - 44 U/L 18  17  18       RADIOGRAPHIC STUDIES: I have personally reviewed the radiological images as listed and agreed with the findings in the report. No results found.

## 2024-10-20 ENCOUNTER — Ambulatory Visit (INDEPENDENT_AMBULATORY_CARE_PROVIDER_SITE_OTHER)

## 2024-10-20 ENCOUNTER — Ambulatory Visit: Payer: Self-pay

## 2024-10-20 ENCOUNTER — Ambulatory Visit: Admission: RE | Admit: 2024-10-20 | Discharge: 2024-10-20 | Disposition: A | Discharge status: Home / Self Care

## 2024-10-20 ENCOUNTER — Ambulatory Visit
Admission: RE | Admit: 2024-10-20 | Discharge: 2024-10-20 | Disposition: A | Discharge status: Home / Self Care | Source: Ambulatory Visit

## 2024-10-20 VITALS — BP 122/47 | HR 65 | Resp 16 | Ht 73.0 in | Wt 190.0 lb

## 2024-10-20 DIAGNOSIS — G8929 Other chronic pain: Secondary | ICD-10-CM | POA: Diagnosis not present

## 2024-10-20 DIAGNOSIS — E7849 Other hyperlipidemia: Secondary | ICD-10-CM | POA: Diagnosis not present

## 2024-10-20 DIAGNOSIS — M25512 Pain in left shoulder: Secondary | ICD-10-CM | POA: Insufficient documentation

## 2024-10-20 DIAGNOSIS — E559 Vitamin D deficiency, unspecified: Secondary | ICD-10-CM | POA: Insufficient documentation

## 2024-10-20 DIAGNOSIS — M7712 Lateral epicondylitis, left elbow: Secondary | ICD-10-CM | POA: Diagnosis not present

## 2024-10-20 DIAGNOSIS — C50912 Malignant neoplasm of unspecified site of left female breast: Secondary | ICD-10-CM

## 2024-10-20 DIAGNOSIS — C50911 Malignant neoplasm of unspecified site of right female breast: Secondary | ICD-10-CM

## 2024-10-20 DIAGNOSIS — Z131 Encounter for screening for diabetes mellitus: Secondary | ICD-10-CM

## 2024-10-20 NOTE — Patient Instructions (Signed)
 I recommend Voltaren gel for the shoulder and elbow to decrease inflammation.

## 2024-10-20 NOTE — Progress Notes (Signed)
 New patient visit   Patient: Angelica Dougherty   DOB: [birth year]   53 y.o. Female  MRN: [id number] Visit Date: 10/20/2024  Today's healthcare provider: Isaiah DELENA Pepper, MD   Chief Complaint  Patient presents with   Transitions Of Care    TOC/Shoulder pain(LT) No to all vaccines   Subjective    Angelica Dougherty is a 53 y.o. female who presents today as a new patient to establish care.   Discussed the use of AI scribe software for clinical note transcription with the patient, who gave verbal consent to proceed.  History of Present Illness Angelica Dougherty is a 53 year old female who presents with worsening shoulder pain.  She has been experiencing significant shoulder pain that began after returning to work in July following breast surgery and radiation therapy earlier in the year. 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 Initially, the pain was localized from her wrist to her elbow, but it has since worsened and now radiates from her shoulder down her arm, affecting her ability to sleep and perform daily activities. The pain sometimes causes tingling in her fingers, and she has difficulty lifting her arm fully, which she attributes to her previous surgery. The pain is constant and heavy. No recent injuries or falls. Current medications include Tylenol , Benadryl, and exemestane  (Aromasin ) 25 mg. She has not used any topical treatments or anti-inflammatory medications for her shoulder pain yet.   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 [facility]   BREAST BIOPSY Right 09/25/2023   stereo bx, calcs, RIBBON clip-path pending   BREAST BIOPSY Left 09/25/2023   US  bx, savi tag as marker, path pending   BREAST BIOPSY Right 09/25/2023   MM RT BREAST BX W LOC DEV 1ST LESION IMAGE BX SPEC STEREO GUIDE 09/25/2023 [facility]   BREAST BIOPSY Left 09/25/2023   US  LT BREAST BX W LOC DEV 1ST LESION IMG BX SPEC US  GUIDE 09/25/2023 [facility]   BREAST EXCISIONAL BIOPSY Right 1993   benign   BREAST LUMPECTOMY Right 1991   benign   BREAST RECONSTRUCTION WITH PLACEMENT OF TISSUE EXPANDER AND FLEX HD (ACELLULAR HYDRATED DERMIS) Bilateral 12/22/2023   Procedure: BREAST RECONSTRUCTION WITH PLACEMENT OF TISSUE EXPANDER AND FLEX HD (ACELLULAR HYDRATED DERMIS);  Surgeon: Angelica [name], DO;  Location: [facility];  Service: Plastics;  Laterality: Bilateral;   CESAREAN SECTION  12/16/2005   MASTECTOMY W/ SENTINEL NODE BIOPSY Bilateral 12/22/2023   Procedure: MASTECTOMY WITH SENTINEL LYMPH NODE BIOPSY, simple mastectomy;  Surgeon: Angelica Jayson KIDD, MD;  Location: [facility];  Service: General;  Laterality: Bilateral;   REMOVAL OF TISSUE EXPANDER AND PLACEMENT OF IMPLANT Bilateral 02/18/2024   Procedure: REMOVAL, TISSUE EXPANDER, BREAST, WITH IMPLANT INSERTION;  Surgeon: Angelica [name], DO;  Location: [facility];  Service: Plastics;  Laterality: Bilateral;   Family Status  Relation Name Status   Mother  Alive   Father  Alive   Sister  Alive   Brother  Alive   [employer] Aunt  Alive   Mat Uncle  Alive   Pat Aunt  Alive   Pat Aunt  Alive   MGM  Deceased   MGF  Deceased   PGM  Deceased   PGF  Deceased   Cousin paternal Alive  No partnership data on file   Family History  Problem Relation Age of Onset   Asthma Mother    Cancer Maternal Uncle        back cancer at 63   Breast cancer Paternal Aunt        dx 30s, bilateral   Breast  cancer Paternal Aunt        dx 35s   Breast cancer Cousin        dx 81s   Social History   Socioeconomic History   Marital status: Single    Spouse name: Not on file   Number of children: 1   Years of education: Not on file   Highest education level: Some college, no degree  Occupational History   Not on file  Tobacco Use   Smoking status: Never    Passive exposure: Never   Smokeless tobacco: Never  Vaping Use   Vaping status: Never Used  Substance and Sexual Activity   Alcohol use: No   Drug use: No   Sexual activity: Not Currently  Other Topics Concern   Not on file  Social History Narrative   Not on file   Social Drivers of Health   Financial Resource Strain: Low Risk  (10/19/2024)   Overall Financial Resource Strain (CARDIA)    Difficulty of Paying Living Expenses: Not very hard  Food Insecurity: No Food Insecurity (10/19/2024)   Hunger Vital Sign    Worried About Running Out of Food in the Last Year: Never true    Ran Out of Food in the Last Year: Never true  Transportation Needs: No Transportation Needs (10/19/2024)   PRAPARE - Administrator, Civil Service (Medical): No    Lack of Transportation (Non-Medical): No  Physical Activity: Insufficiently Active (10/19/2024)   Exercise Vital Sign    Days of Exercise per Week: 3 days    Minutes of Exercise per Session: 30 min  Stress: No Stress Concern Present (10/19/2024)   Angelica Dougherty [employer] Occupational [employer] - Occupational Stress Questionnaire    Feeling of Stress: Not at all  Social Connections: Unknown (10/19/2024)   Social Connection and Isolation Panel    Frequency of Communication with Friends and Family: More than three times a week    Frequency of Social Gatherings with Friends and Family: Once a week    Attends Religious Services: More than 4 times per year    Active Member of [employer] or Organizations: Not on file    Attends Banker Meetings: Not on file    Marital Status:  Never married   Outpatient Medications Prior to Visit  Medication Sig   acetaminophen  (TYLENOL ) 500 MG tablet Take 500 mg by mouth every 6 (six) hours as needed.   diphenhydrAMINE (BENADRYL) 25 MG tablet Take 25 mg by mouth daily as needed for allergies.   exemestane  (AROMASIN ) 25 MG tablet Take 1 tablet (25 mg total) by mouth daily after breakfast.   No facility-administered medications prior to visit.   Allergies  Allergen Reactions   Codeine Other (See Comments)    Swelling    Reviews of Systems as noted in HPI.      Objective  BP (!) 122/47 (BP Location: Right Arm, Patient Position: Sitting, Cuff Size: Normal)   Pulse 65   Resp 16   Ht 6' 1 (1.854 m)   Wt 190 lb (86.2 kg)   LMP 06/08/2006 (Approximate) Comment: Hysterectomy  SpO2 100%   BMI 25.07 kg/m     Physical Exam Constitutional:      Appearance: Normal appearance.  HENT:     Head: Normocephalic and atraumatic.     Mouth/Throat:     Mouth: Mucous membranes are moist.  Eyes:     Pupils: Pupils are equal, round, and reactive to light.  Cardiovascular:     Rate and Rhythm: Normal rate and regular rhythm.     Heart sounds: Normal heart sounds.  Pulmonary:     Effort: Pulmonary effort is normal.     Breath sounds: Normal breath sounds.  Musculoskeletal:     Left shoulder: Tenderness present. No swelling, deformity, effusion or bony tenderness. Decreased range of motion. Decreased strength.     Left elbow: Tenderness present in lateral epicondyle. No medial epicondyle tenderness.  Skin:    General: Skin is warm.  Neurological:     General: No focal deficit present.     Mental Status: She is alert.     Depression Screen    06/17/2024    2:24 PM 08/15/2023   10:08 AM 11/03/2017    9:19 AM  PHQ 2/9 Scores  PHQ - 2 Score 0 0 0  PHQ- 9 Score  0  0      Data saved with a previous flowsheet row definition   No results found for any visits on 10/20/24.  Assessment & Plan      Problem List Items  Addressed This Visit       Musculoskeletal and Integument   Left lateral epicondylitis     Other   HLD (hyperlipidemia)   Relevant Orders   Lipid panel   Vitamin D  deficiency   Relevant Orders   VITAMIN D  25 Hydroxy (Vit-D Deficiency, Fractures)   Chronic left shoulder pain - Primary   Relevant Orders   DG Shoulder Left (Completed)   Other Visit Diagnoses       Screening for diabetes mellitus (DM)       Relevant Orders   Hemoglobin A1c     Bilateral malignant neoplasm of breast in female, unspecified estrogen receptor status, unspecified site of breast [facility])          Assessment & Plan Left shoulder pain Chronic left shoulder pain, worsened by repetitive movements at work. Ddx includes arthritis, rotator cuff tendinopathy, adhesive capsulitis. May be exacerbated by radiation and mastectomy. - Ordered x-ray of left shoulder. - Recommended physical therapy - Advised ibuprofen or naproxen, can try Voltaren gel instead - Consider referral to orthopedics  Left lateral epicondylitis Patient with chronic L elbow pain worsened by repetitive movements at work. Exam notable for pain at left lateral epicondyle. - Recommend Voltaren gel PRN - Recommend physical therapy - Provided information on lateral epicondylitis management, including icing and stretching. - Discussed potential steroid injection if needed.  Bilateral breast cancer, status post surgery and radiation, on aromatase inhibitor [facility]) Patient with invasive carcinoma in bilateral breasts s/p bilateral mastectomy in January 2025. S/p radiation. Continues on exemestane . - Continue exemestane  25 mg. - Follow up with oncology  Hyperlipidemia Last LDL of 221. Not taking a statin currently. Will recheck fasting lipid panel, may need to start on a statin.  Vitamin D  Deficiency Last vit  D of 11. Will recheck vit D level.  General Health Maintenance Colonoscopy scheduled. Declined flu and shingles vaccines. Discussed  pneumonia and tetanus vaccines. - Proceed with colonoscopy in December. - Consider pneumonia vaccine. - Consider tetanus vaccine, last received in 2015.   Return in about 1 year (around 10/20/2025) for Annual Physical Exam.      Angelica DELENA Pepper, MD  [facility] [phone] (phone) [phone] (fax)

## 2024-10-21 DIAGNOSIS — Z131 Encounter for screening for diabetes mellitus: Secondary | ICD-10-CM | POA: Diagnosis not present

## 2024-10-21 DIAGNOSIS — E559 Vitamin D deficiency, unspecified: Secondary | ICD-10-CM | POA: Diagnosis not present

## 2024-10-21 DIAGNOSIS — E7849 Other hyperlipidemia: Secondary | ICD-10-CM | POA: Diagnosis not present

## 2024-10-22 LAB — HEMOGLOBIN A1C
Est. average glucose Bld gHb Est-mCnc: 111 mg/dL
Hgb A1c MFr Bld: 5.5 % (ref 4.8–5.6)

## 2024-10-22 LAB — LIPID PANEL
Chol/HDL Ratio: 4.9 ratio — ABNORMAL HIGH (ref 0.0–4.4)
Cholesterol, Total: 222 mg/dL — ABNORMAL HIGH (ref 100–199)
HDL: 45 mg/dL (ref >39)
LDL Chol Calc (NIH): 164 mg/dL — ABNORMAL HIGH (ref 0–99)
Triglycerides: 72 mg/dL (ref 0–149)
VLDL Cholesterol Cal: 13 mg/dL (ref 5–40)

## 2024-10-22 LAB — VITAMIN D 25 HYDROXY (VIT D DEFICIENCY, FRACTURES): Vit D, 25-Hydroxy: 17.4 ng/mL — ABNORMAL LOW (ref 30.0–100.0)

## 2024-10-26 ENCOUNTER — Ambulatory Visit: Admitting: Plastic Surgery

## 2024-10-26 DIAGNOSIS — C50911 Malignant neoplasm of unspecified site of right female breast: Secondary | ICD-10-CM

## 2024-10-26 DIAGNOSIS — Z170 Estrogen receptor positive status [ER+]: Secondary | ICD-10-CM | POA: Diagnosis not present

## 2024-10-26 DIAGNOSIS — Z08 Encounter for follow-up examination after completed treatment for malignant neoplasm: Secondary | ICD-10-CM

## 2024-10-26 DIAGNOSIS — Z9013 Acquired absence of bilateral breasts and nipples: Secondary | ICD-10-CM

## 2024-10-26 DIAGNOSIS — Z853 Personal history of malignant neoplasm of breast: Secondary | ICD-10-CM | POA: Diagnosis not present

## 2024-10-26 NOTE — Progress Notes (Signed)
 Patient ID: Angelica Dougherty, female    DOB: 17-Dec-1970, 53 y.o.   MRN: [id number]   No chief complaint on file.   The patient is a 53 year old female here for 1 year follow-up.  In November 2024 she was seen for consultation for breast reconstruction.  She had a invasive mammary carcinoma of the left breast and DCIS of the right breast.  In January 2025 she underwent bilateral immediate breast reconstruction with expanders and Flex HD above the muscle.  In March she had an exchange with 375 cc ultra high-profile gel implants.  Overall she is doing well and she is pleased with her results.  She is having some tightness of her left shoulder and arm.  He is not able to raise her left arm as high as the right arm.  She did have radiation to the left side and it is still noted in the skin changes.    Review of Systems  Constitutional: Negative.   Eyes: Negative.   Respiratory: Negative.    Cardiovascular: Negative.   Gastrointestinal: Negative.   Endocrine: Negative.   Genitourinary: Negative.   Musculoskeletal: Negative.     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 [facility]   BREAST BIOPSY Right 09/25/2023   stereo bx, calcs, RIBBON clip-path pending   BREAST BIOPSY Left 09/25/2023   US  bx, savi tag as marker, path pending   BREAST BIOPSY Right 09/25/2023   MM RT BREAST BX W LOC DEV 1ST LESION IMAGE BX SPEC STEREO GUIDE 09/25/2023 [facility]   BREAST BIOPSY Left 09/25/2023   US  LT BREAST  BX W LOC DEV 1ST LESION IMG BX SPEC US  GUIDE 09/25/2023 [facility]   BREAST EXCISIONAL BIOPSY Right 1993   benign   BREAST LUMPECTOMY Right 1991   benign   BREAST RECONSTRUCTION WITH PLACEMENT OF TISSUE EXPANDER AND FLEX HD (ACELLULAR HYDRATED DERMIS) Bilateral 12/22/2023   Procedure: BREAST RECONSTRUCTION WITH PLACEMENT OF TISSUE EXPANDER AND FLEX HD (ACELLULAR HYDRATED DERMIS);  Surgeon: Lowery [name], DO;  Location: [facility];  Service: Plastics;  Laterality: Bilateral;   CESAREAN SECTION  12/16/2005   MASTECTOMY W/ SENTINEL NODE BIOPSY Bilateral 12/22/2023   Procedure: MASTECTOMY WITH SENTINEL LYMPH NODE BIOPSY, simple mastectomy;  Surgeon: Marinda Jayson KIDD, MD;  Location: [facility];  Service: General;  Laterality: Bilateral;   REMOVAL OF TISSUE EXPANDER AND PLACEMENT OF IMPLANT Bilateral 02/18/2024   Procedure: REMOVAL, TISSUE EXPANDER, BREAST, WITH IMPLANT INSERTION;  Surgeon: Lowery [name], DO;  Location: [facility];  Service: Plastics;  Laterality: Bilateral;      Current Outpatient Medications:    acetaminophen  (TYLENOL ) 500 MG tablet, Take 500 mg by mouth every 6 (six) hours as needed., Disp: , Rfl:    diphenhydrAMINE (BENADRYL) 25 MG tablet, Take 25 mg by mouth daily as needed for allergies., Disp: , Rfl:    exemestane  (AROMASIN ) 25 MG tablet, Take 1 tablet (25 mg total) by mouth daily after breakfast., Disp: 90 tablet, Rfl: 1  Objective:   There were no vitals filed for this visit.  Physical Exam Vitals reviewed.  Constitutional:      Appearance: Normal appearance.  HENT:     Head: Atraumatic.  Cardiovascular:     Rate and Rhythm: Normal rate.     Pulses: Normal pulses.  Pulmonary:     Effort: Pulmonary effort is normal.  Skin:    General: Skin is warm.     Capillary Refill: Capillary refill takes less than 2 seconds.     Coloration: Skin is not jaundiced.     Findings: No bruising or lesion.  Neurological:     Mental Status: She is  alert and oriented to person, place, and time.  Psychiatric:        Mood and Affect: Mood normal.        Behavior: Behavior normal.        Thought Content: Thought content normal.        Judgment: Judgment normal.     Assessment & Plan:  Malignant neoplasm of right breast in female, estrogen receptor positive, unspecified site of breast (HCC)  S/P bilateral mastectomy  Patient is scheduled to see her physical therapist tomorrow.  She knows to ask about dry needling for the left side.  If order is needed I am most happy to write that for her.  I like to see if that would help her loosen that left arm up some.  She is a candidate for fat grafting if the time comes that she wants to do that.  Nipple areola tattooing is also an option.  We will at least plan to see her back in a year but she is welcome to come back earlier if she makes any decisions for further treatment.  Pictures were obtained of the patient and placed in the chart with the patient's or guardian's permission.   [name] Kalei Meda, DO

## 2024-10-27 ENCOUNTER — Inpatient Hospital Stay: Attending: Oncology | Admitting: Occupational Therapy

## 2024-10-27 DIAGNOSIS — M7712 Lateral epicondylitis, left elbow: Secondary | ICD-10-CM

## 2024-10-27 NOTE — Therapy (Signed)
 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 [facility]   BREAST BIOPSY Right 09/25/2023   stereo bx, calcs, RIBBON clip-path pending   BREAST BIOPSY Left 09/25/2023   US  bx, savi tag as marker, path pending   BREAST BIOPSY Right 09/25/2023   MM RT BREAST BX W LOC DEV 1ST LESION IMAGE BX SPEC STEREO GUIDE 09/25/2023 [facility]   BREAST BIOPSY Left 09/25/2023   US  LT BREAST BX W LOC DEV 1ST LESION IMG BX SPEC US  GUIDE 09/25/2023 [facility]   BREAST EXCISIONAL BIOPSY Right 1993   benign   BREAST LUMPECTOMY Right 1991   benign   BREAST RECONSTRUCTION WITH PLACEMENT OF TISSUE EXPANDER AND FLEX HD (ACELLULAR HYDRATED DERMIS) Bilateral 12/22/2023   Procedure: BREAST RECONSTRUCTION WITH PLACEMENT OF TISSUE EXPANDER AND FLEX HD (ACELLULAR HYDRATED DERMIS);  Surgeon: Lowery [name], DO;  Location: [facility];  Service: Plastics;  Laterality: Bilateral;   CESAREAN SECTION  12/16/2005   MASTECTOMY W/ SENTINEL NODE BIOPSY Bilateral 12/22/2023   Procedure: MASTECTOMY WITH SENTINEL LYMPH NODE BIOPSY, simple mastectomy;  Surgeon: Marinda Jayson KIDD, MD;  Location: [facility];  Service: General;  Laterality: Bilateral;   REMOVAL OF TISSUE EXPANDER AND PLACEMENT OF IMPLANT Bilateral  02/18/2024   Procedure: REMOVAL, TISSUE EXPANDER, BREAST, WITH IMPLANT INSERTION;  Surgeon: Lowery [name], DO;  Location: [facility];  Service: Plastics;  Laterality: Bilateral;   Patient Active Problem List   Diagnosis Date Noted   Vitamin D  deficiency 10/20/2024   Chronic left shoulder pain 10/20/2024   Left lateral epicondylitis 10/20/2024   Family history of breast cancer 05/24/2024   Aromatase inhibitor use 05/24/2024   Breast cancer, right (HCC) 01/20/2024   S/P bilateral mastectomy 12/22/2023   Genetic testing 10/28/2023   Breast cancer, left (HCC) 09/30/2023   Annual physical exam 08/15/2023   Screening mammogram for breast cancer 08/15/2023   Screen for colon cancer 08/15/2023   Post-menopausal 08/15/2023   HLD (hyperlipidemia) 08/03/2007    PCP: Emilio NP  REFERRING PROVIDER: Dr Babara  REFERRING DIAG: Breast CA with bil mastectomy with reconstruction  Session today: Patient referred to OT for screening for increased left upper extremity pain. Patient returned back to work around 30 June. Patient has been participating in the exercise program for 12 weeks that ended about 3 weeks ago that involved strengthening cardio flexibility and balance. Patient report the pain in her left arm started about 2 months ago.  Started at the elbow and then sometimes goes up to the shoulder. Patient had double mastectomy with immediate reconstruction with expanders and then switched out to silicone implants. Patient admits she did not keep up with her range of motion exercises and flexibility exercises for bilateral shoulders the last few months.  Upon assessment patient is tender over lateral epicondyle Positive test for wrist extension with resistance in third digit extension pain 8/10 Increased pain 8/10 with gripping.  Worse with extended arm.  Home exercise reviewed with patient.: -Patient to do heat 2-3 times a day followed by massage to lateral upper condyle to 30  minutes Followed by ice massage. -Reviewed with patient modifications to work activities.  Simulated some of her work activities the way she picks up and hold objects.  Recommend for patient to use her palms and forearm and not her extensors.  Keep things close to her body.  And enlarge her grips. -Recommend for patient and educated patient in donning and doffing and wearing appropriately at counterforce strap for lateral epicondylitis with activities that she could not modify. Patient to wear during the day but would not recommend at this time for sleeping.  -Also patient to work on her shoulder flexibility doing her pulley exercises for shoulder flexion and abduction.  Discussed with her open hand or using neutral position to not aggravate lateral epicondyle or extensor brevis.  Patient to do these changes and home program for about 2 to 3 weeks and return back. - pt has increase tightness and tenderness    Ancel Peters, OTR/L,CLT 10/27/2024, 6:58 PM

## 2024-11-30 ENCOUNTER — Encounter: Admission: RE | Disposition: A | Payer: Self-pay | Source: Home / Self Care | Attending: Gastroenterology

## 2024-11-30 ENCOUNTER — Ambulatory Visit: Admitting: Anesthesiology

## 2024-11-30 ENCOUNTER — Ambulatory Visit
Admission: RE | Admit: 2024-11-30 | Discharge: 2024-11-30 | Disposition: A | Discharge status: Home / Self Care | Attending: Gastroenterology | Admitting: Gastroenterology

## 2024-11-30 ENCOUNTER — Other Ambulatory Visit: Payer: Self-pay

## 2024-11-30 ENCOUNTER — Encounter: Payer: Self-pay | Admitting: Gastroenterology

## 2024-11-30 DIAGNOSIS — K635 Polyp of colon: Secondary | ICD-10-CM | POA: Insufficient documentation

## 2024-11-30 DIAGNOSIS — Z1211 Encounter for screening for malignant neoplasm of colon: Secondary | ICD-10-CM | POA: Diagnosis not present

## 2024-11-30 DIAGNOSIS — K640 First degree hemorrhoids: Secondary | ICD-10-CM | POA: Insufficient documentation

## 2024-11-30 HISTORY — PX: POLYPECTOMY: SHX149

## 2024-11-30 HISTORY — PX: COLONOSCOPY: SHX5424

## 2024-11-30 SURGERY — COLONOSCOPY
Anesthesia: General

## 2024-11-30 MED ORDER — SODIUM CHLORIDE 0.9 % IV SOLN: INTRAVENOUS | Status: DC

## 2024-11-30 MED ORDER — PROPOFOL 10 MG/ML IV BOLUS
INTRAVENOUS | Status: DC | PRN
  Administered 2024-11-30: 50 mg via INTRAVENOUS
  Administered 2024-11-30: 100 mg via INTRAVENOUS
  Administered 2024-11-30: 120 ug/kg/min via INTRAVENOUS

## 2024-11-30 MED ORDER — DEXMEDETOMIDINE HCL IN NACL 80 MCG/20ML IV SOLN
INTRAVENOUS | Status: DC | PRN
  Administered 2024-11-30: 12 ug via INTRAVENOUS

## 2024-11-30 NOTE — Transfer of Care (Signed)
 Immediate Anesthesia Transfer of Care Note  Patient: Angelica Dougherty  Procedure(s) Performed: COLONOSCOPY POLYPECTOMY, INTESTINE  Patient Location: PACU and Endoscopy Unit  Anesthesia Type:General  Level of Consciousness: sedated  Airway & Oxygen Therapy: Patient Spontanous Breathing  Post-op Assessment: Report given to RN  Post vital signs: Reviewed and stable  Last Vitals:  Vitals Value Taken Time  BP 123/64 11/30/24 08:53  Temp    Pulse 65 11/30/24 08:53  Resp 17 11/30/24 08:53  SpO2 99 % 11/30/24 08:53  Vitals shown include unfiled device data.  Last Pain:  Vitals:   11/30/24 0812  TempSrc: Temporal  PainSc: 0-No pain         Complications: No notable events documented.

## 2024-11-30 NOTE — Anesthesia Postprocedure Evaluation (Signed)
"   Anesthesia Post Note  Patient: Angelica Dougherty  Procedure(s) Performed: COLONOSCOPY POLYPECTOMY, INTESTINE  Patient location during evaluation: PACU Anesthesia Type: General Level of consciousness: awake Pain management: pain level controlled Vital Signs Assessment: vitals unstable Respiratory status: nonlabored ventilation Cardiovascular status: stable Anesthetic complications: no   No notable events documented.   Last Vitals:  Vitals:   11/30/24 0903 11/30/24 0913  BP: (!) 163/74 (!) 173/89  Pulse: 82 71  Resp: 14   Temp:    SpO2: 99% 100%    Last Pain:  Vitals:   11/30/24 0913  TempSrc:   PainSc: 0-No pain                 VAN STAVEREN,Angelica Dougherty      "

## 2024-11-30 NOTE — H&P (Signed)
 "  Angelica Copping, Angelica Dougherty [facility] [address], [address] [place], [place] [address] Phone: [phone] Fax : [phone]  Primary Care Physician:  Franchot Isaiah LABOR, Angelica Dougherty Primary Gastroenterologist:  Dr. Copping  Pre-Procedure History & Physical: HPI:  Angelica Dougherty is a 53 y.o. female is here for a screening colonoscopy.   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 [place]   BREAST BIOPSY Right 09/25/2023   stereo bx, calcs, RIBBON clip-path pending   BREAST BIOPSY Left 09/25/2023   US  bx, savi tag as marker, path pending   BREAST BIOPSY Right 09/25/2023   MM RT BREAST BX W LOC DEV 1ST LESION IMAGE BX SPEC STEREO GUIDE 09/25/2023 [facility]   BREAST BIOPSY Left 09/25/2023   US  LT BREAST BX W LOC DEV 1ST LESION IMG BX SPEC US  GUIDE 09/25/2023 [facility]   BREAST EXCISIONAL BIOPSY Right 1993   benign   BREAST LUMPECTOMY Right 1991   benign   BREAST RECONSTRUCTION WITH PLACEMENT OF TISSUE EXPANDER AND FLEX HD (ACELLULAR HYDRATED DERMIS) Bilateral 12/22/2023   Procedure: BREAST RECONSTRUCTION WITH PLACEMENT OF TISSUE EXPANDER AND FLEX HD (ACELLULAR HYDRATED DERMIS);  Surgeon: Angelica [name], Angelica Dougherty;  Location: [facility];  Service: Plastics;  Laterality: Bilateral;   CESAREAN SECTION  12/16/2005   MASTECTOMY W/ SENTINEL NODE BIOPSY Bilateral 12/22/2023   Procedure: MASTECTOMY WITH SENTINEL LYMPH NODE BIOPSY, simple mastectomy;  Surgeon: Angelica Jayson KIDD, Angelica Dougherty;  Location: [facility];  Service: General;   Laterality: Bilateral;   REMOVAL OF TISSUE EXPANDER AND PLACEMENT OF IMPLANT Bilateral 02/18/2024   Procedure: REMOVAL, TISSUE EXPANDER, BREAST, WITH IMPLANT INSERTION;  Surgeon: Angelica [name], Angelica Dougherty;  Location: [facility];  Service: Plastics;  Laterality: Bilateral;    Prior to Admission medications  Medication Sig Start Date End Date Taking? Authorizing Provider  exemestane  (AROMASIN ) 25 MG tablet Take 1 tablet (25 mg total) by mouth daily after breakfast. 08/31/24  Yes Angelica Call, Angelica Dougherty  acetaminophen  (TYLENOL ) 500 MG tablet Take 500 mg by mouth every 6 (six) hours as needed.    Provider, Historical, Angelica Dougherty  diphenhydrAMINE (BENADRYL) 25 MG tablet Take 25 mg by mouth daily as needed for allergies.    Provider, Historical, Angelica Dougherty    Allergies as of 04/05/2024 - Review Complete 03/23/2024  Allergen Reaction Noted   Codeine Other (See Comments) 10/21/2006    Family History  Problem Relation Age of Onset   Asthma Mother    Cancer Maternal Uncle        back cancer at 48   Breast cancer Paternal Aunt        dx 30s, bilateral   Breast cancer Paternal Aunt        dx 43s   Breast cancer Cousin        dx 34s    Social History   Socioeconomic History   Marital status: Single    Spouse name: Not on file   Number of children: 1   Years of education: Not on file  Highest education level: Some college, no degree  Occupational History   Not on file  Tobacco Use   Smoking status: Never    Passive exposure: Never   Smokeless tobacco: Never  Vaping Use   Vaping status: Never Used  Substance and Sexual Activity   Alcohol use: No   Drug use: No   Sexual activity: Not Currently  Other Topics Concern   Not on file  Social History Narrative   Not on file   Social Drivers of Health   Tobacco Use: Low Risk (10/20/2024)   Patient History    Smoking Tobacco Use: Never    Smokeless Tobacco Use: Never    Passive Exposure: Never  Financial Resource Strain: Low Risk (10/19/2024)    Overall Financial Resource Strain (CARDIA)    Difficulty of Paying Living Expenses: Not very hard  Food Insecurity: No Food Insecurity (10/19/2024)   Epic    Worried About Programme Researcher, Broadcasting/film/video in the Last Year: Never true    Ran Out of Food in the Last Year: Never true  Transportation Needs: No Transportation Needs (10/19/2024)   Epic    Lack of Transportation (Medical): No    Lack of Transportation (Non-Medical): No  Physical Activity: Insufficiently Active (10/19/2024)   Exercise Vital Sign    Days of Exercise per Week: 3 days    Minutes of Exercise per Session: 30 min  Stress: No Stress Concern Present (10/19/2024)   [employer] of Occupational Health - Occupational Stress Questionnaire    Feeling of Stress: Not at all  Social Connections: Unknown (10/19/2024)   Social Connection and Isolation Panel    Frequency of Communication with Friends and Family: More than three times a week    Frequency of Social Gatherings with Friends and Family: Once a week    Attends Religious Services: More than 4 times per year    Active Member of [employer] or Organizations: Not on file    Attends Banker Meetings: Not on file    Marital Status: Never married  Intimate Partner Violence: Not At Risk (12/22/2023)   Humiliation, Afraid, Rape, and Kick questionnaire    Fear of Current or Ex-Partner: No    Emotionally Abused: No    Physically Abused: No    Sexually Abused: No  Depression (PHQ2-9): Low Risk (06/17/2024)   Depression (PHQ2-9)    PHQ-2 Score: 0  Alcohol Screen: Low Risk (10/19/2024)   Alcohol Screen    Last Alcohol Screening Score (AUDIT): 1  Housing: Unknown (10/19/2024)   Epic    Unable to Pay for Housing in the Last Year: No    Number of Times Moved in the Last Year: Not on file    Homeless in the Last Year: No  Utilities: Not At Risk (12/22/2023)   [employer] Utilities    Threatened with loss of utilities: No  Health Literacy: Not on file    Review of Systems: See  HPI, otherwise negative ROS  Physical Exam: BP 135/83   Pulse 73   Temp (!) 96 F (35.6 C) (Temporal)   Resp 12   Ht 6' 1 (1.854 m)   Wt 85.7 kg   LMP 06/08/2006 Comment: Hysterectomy  SpO2 100%   BMI 24.94 kg/m  General:   Alert,  pleasant and cooperative in NAD Head:  Normocephalic and atraumatic. Neck:  Supple; no masses or thyromegaly. Lungs:  Clear throughout to auscultation.    Heart:  Regular rate and rhythm. Abdomen:  Soft, nontender and  nondistended. Normal bowel sounds, without guarding, and without rebound.   Neurologic:  Alert and  oriented x4;  grossly normal neurologically.  Impression/Plan: Angelica Dougherty is now here to undergo a screening colonoscopy.  Risks, benefits, and alternatives regarding colonoscopy have been reviewed with the patient.  Questions have been answered.  All parties agreeable. "

## 2024-11-30 NOTE — Op Note (Signed)
 [facility] Gastroenterology Patient Name: Angelica Dougherty Procedure Date: 11/30/2024 8:32 AM MRN: [id number] Account #: [id number] Date of Birth: 07/19/1971 Admit Type: Outpatient Age: 53 Room: [facility] ENDO ROOM 4 Gender: Female Note Status: Finalized Instrument Name: Colon Scope [id number] Procedure:             Colonoscopy Indications:           Screening for colorectal malignant neoplasm Providers:             Rogelia Copping MD, MD Referring MD:          Isaiah LABOR. Franchot (Referring MD) Medicines:             Propofol  per Anesthesia Complications:         No immediate complications. Procedure:             Pre-Anesthesia Assessment:                        - Prior to the procedure, a History and Physical was                         performed, and patient medications and allergies were                         reviewed. The patient's tolerance of previous                         anesthesia was also reviewed. The risks and benefits                         of the procedure and the sedation options and risks                         were discussed with the patient. All questions were                         answered, and informed consent was obtained. Prior                         Anticoagulants: The patient has taken no anticoagulant                         or antiplatelet agents. ASA Grade Assessment: II - A                         patient with mild systemic disease. After reviewing                         the risks and benefits, the patient was deemed in                         satisfactory condition to undergo the procedure.                        After obtaining informed consent, the colonoscope was                         passed under direct vision. Throughout the procedure,  the patient's blood pressure, pulse, and oxygen                         saturations were monitored continuously. The                         Colonoscope was introduced  through the anus and                         advanced to the the cecum, identified by appendiceal                         orifice and ileocecal valve. The colonoscopy was                         performed without difficulty. The patient tolerated                         the procedure well. The quality of the bowel                         preparation was excellent. Findings:      The perianal and digital rectal examinations were normal.      Two sessile polyps were found in the sigmoid colon. The polyps were 3 to       5 mm in size. These polyps were removed with a cold snare. Resection and       retrieval were complete.      Non-bleeding internal hemorrhoids were found during retroflexion. The       hemorrhoids were Grade I (internal hemorrhoids that do not prolapse). Impression:            - Two 3 to 5 mm polyps in the sigmoid colon, removed                         with a cold snare. Resected and retrieved.                        - Non-bleeding internal hemorrhoids. Recommendation:        - Discharge patient to home.                        - Resume previous diet.                        - Continue present medications.                        - If the pathology report reveals adenomatous tissue,                         then repeat the colonoscopy for surveillance in 7                         years. Procedure Code(s):     --- Professional ---                        [phone], Colonoscopy, flexible; with removal of  tumor(s), polyp(s), or other lesion(s) by snare                         technique Diagnosis Code(s):     --- Professional ---                        Z12.11, Encounter for screening for malignant neoplasm                         of colon                        D12.5, Benign neoplasm of sigmoid colon CPT copyright 2022 American Medical Association. All rights reserved. The codes documented in this report are preliminary and upon coder review may  be revised to  meet current compliance requirements. Rogelia Copping MD, MD 11/30/2024 8:52:54 AM This report has been signed electronically. Number of Addenda: 0 Note Initiated On: 11/30/2024 8:32 AM Scope Withdrawal Time: 0 hours 6 minutes 30 seconds  Total Procedure Duration: 0 hours 12 minutes 14 seconds  Estimated Blood Loss:  Estimated blood loss: none.      [facility]

## 2024-11-30 NOTE — Anesthesia Preprocedure Evaluation (Signed)
 "                                  Anesthesia Evaluation  Patient identified by MRN, date of birth, ID band Patient awake    Reviewed: Allergy & Precautions, NPO status , Patient's Chart, lab work & pertinent test results  History of Anesthesia Complications (+) PONV and history of anesthetic complications  Airway Mallampati: II  TM Distance: >3 FB Neck ROM: full    Dental  (+) Teeth Intact   Pulmonary neg pulmonary ROS   Pulmonary exam normal        Cardiovascular Exercise Tolerance: Good negative cardio ROS Normal cardiovascular exam Rhythm:Regular Rate:Normal     Neuro/Psych negative neurological ROS  negative psych ROS   GI/Hepatic negative GI ROS, Neg liver ROS,,,  Endo/Other  negative endocrine ROS    Renal/GU negative Renal ROS  negative genitourinary   Musculoskeletal negative musculoskeletal ROS (+)    Abdominal Normal abdominal exam  (+)   Peds negative pediatric ROS (+)  Hematology negative hematology ROS (+)   Anesthesia Other Findings Past Medical History: No date: Allergy 09/2023: Ductal carcinoma in situ (DCIS) of right breast No date: Fibroid uterus No date: Hyperlipidemia 09/2023: Malignant neoplasm of left breast in female, estrogen  receptor positive (HCC) 09/2023: Malignant neoplasm of right breast in female, estrogen  receptor positive (HCC) No date: PONV (postoperative nausea and vomiting)  Past Surgical History: 06/2006: ABDOMINAL HYSTERECTOMY     Comment:  Total hysterctomy excessive bleeding and fibroids per               patient report. [place] 09/25/2023: BREAST BIOPSY; Right     Comment:  stereo bx, calcs, RIBBON clip-path pending 09/25/2023: BREAST BIOPSY; Left     Comment:  US  bx, savi tag as marker, path pending 09/25/2023: BREAST BIOPSY; Right     Comment:  MM RT BREAST BX W LOC DEV 1ST LESION IMAGE BX SPEC               STEREO GUIDE 09/25/2023 [facility] 09/25/2023: BREAST BIOPSY; Left      Comment:  US  LT BREAST BX W LOC DEV 1ST LESION IMG BX SPEC US                GUIDE 09/25/2023 [facility] 1993: BREAST EXCISIONAL BIOPSY; Right     Comment:  benign 1991: BREAST LUMPECTOMY; Right     Comment:  benign 12/22/2023: BREAST RECONSTRUCTION WITH PLACEMENT OF TISSUE EXPANDER  AND FLEX HD (ACELLULAR HYDRATED DERMIS); Bilateral     Comment:  Procedure: BREAST RECONSTRUCTION WITH PLACEMENT OF               TISSUE EXPANDER AND FLEX HD (ACELLULAR HYDRATED DERMIS);               Surgeon: Lowery [name], DO;  Location: [facility];                Service: Plastics;  Laterality: Bilateral; 12/16/2005: CESAREAN SECTION 12/22/2023: MASTECTOMY W/ SENTINEL NODE BIOPSY; Bilateral     Comment:  Procedure: MASTECTOMY WITH SENTINEL LYMPH NODE BIOPSY,               simple mastectomy;  Surgeon: Marinda Jayson KIDD, MD;                Location: [facility];  Service: General;  Laterality:  Bilateral; 02/18/2024: REMOVAL OF TISSUE EXPANDER AND PLACEMENT OF IMPLANT;  Bilateral     Comment:  Procedure: REMOVAL, TISSUE EXPANDER, BREAST, WITH               IMPLANT INSERTION;  Surgeon: Lowery [name], DO;                Location: [facility];  Service: Plastics;               Laterality: Bilateral;  BMI    Body Mass Index: 24.94 kg/m      Reproductive/Obstetrics negative OB ROS                              Anesthesia Physical Anesthesia Plan  ASA: 2  Anesthesia Plan: General   Post-op Pain Management:    Induction: Intravenous  PONV Risk Score and Plan: Propofol  infusion and TIVA  Airway Management Planned: Natural Airway and Nasal Cannula  Additional Equipment:   Intra-op Plan:   Post-operative Plan:   Informed Consent: I have reviewed the patients History and Physical, chart, labs and discussed the procedure including the risks, benefits and alternatives for the proposed anesthesia with the patient or authorized  representative who has indicated his/her understanding and acceptance.     Dental Advisory Given  Plan Discussed with: CRNA  Anesthesia Plan Comments:         Anesthesia Quick Evaluation  "

## 2024-12-01 LAB — SURGICAL PATHOLOGY

## 2024-12-03 ENCOUNTER — Ambulatory Visit: Payer: Self-pay | Admitting: Gastroenterology

## 2024-12-13 ENCOUNTER — Telehealth: Payer: Self-pay | Admitting: Radiation Oncology

## 2024-12-13 NOTE — Telephone Encounter (Signed)
 Pt called to r/s appt for 1/8 - transferred call to [place] team - [facility]

## 2024-12-16 ENCOUNTER — Ambulatory Visit: Admitting: Radiation Oncology

## 2024-12-23 ENCOUNTER — Ambulatory Visit: Admitting: General Surgery

## 2024-12-30 ENCOUNTER — Encounter: Payer: Self-pay | Admitting: General Surgery

## 2024-12-30 ENCOUNTER — Ambulatory Visit: Admitting: General Surgery

## 2024-12-30 NOTE — Patient Instructions (Signed)
 How to Do a Breast Self-Exam Doing breast self-exams can help you stay healthy. They're one way to know what's normal for your breasts. They can help you catch a problem while it's still small and can be treated. You need to: Check your breasts often. Tell your doctor about any changes. You should do breast self-exams even if you have breast implants. What you need: A mirror. A well-lit room. A pillow or other soft object. How to do a breast self-exam Look for changes  Take off all the clothes above your waist. Stand in front of a mirror in a room with good lighting. Put your hands down at your sides. Compare your breasts in the mirror. Look for difference between them, such as: Differences in shape. Differences in size. Wrinkles, dips, and bumps in one breast and not the other. Look at each breast for skin changes, such as: Redness. Scaly spots. Spots where your skin is thicker. Dimpling. Open sores. Look for changes in your nipples, such as: Fluid coming out of a nipple. Fluid around a nipple. Bleeding. Dimpling. Redness. A nipple that looks pushed in or that has changed position. Feel for changes Lie on your back. Feel each breast. To do this: Pick a breast to feel. Place a pillow under the shoulder closest to that breast. Put the arm closest to that breast behind your head. Feel the breast using the hand of your other arm. Use the pads of your three middle fingers to make small circles starting near the nipple. Use light, medium, and firm pressure. Keep making circles, moving down over the breast. Stop when you feel your ribs. Start making circles with your fingers again, this time going up until you reach your collarbone. Then, make circles out across your breast and into your armpit area. Squeeze your nipple. Check for fluid and lumps. Do these steps again to check your other breast. Sit or stand in the tub or shower. With soapy water on your skin, feel each breast  the same way you did when you were lying down. Write down what you find Writing down what you find can help you keep track of what you want to tell your doctor. Write down: What's normal for each breast. Any changes you find. Write down: The kind of change. If your breast feels tender or painful. Any lump you find. Write down its size and where it is. When you last had your period. General tips If you're breastfeeding, the best time to check your breasts is after you feed your baby or after you use a breast pump. If you get a period, the best time to check your breasts is 5-7 days after your period ends. With time, you'll get more used to doing the self-exam. You'll also start to know if there are changes in your breasts. Contact a doctor if: You see a change in the shape or size of your breasts or nipples. You see a change in the skin of your breast or nipples. You have fluid coming from your nipples that isn't normal. You find a new lump or thick area. You have breast pain. You have any concerns about your breast health. This information is not intended to replace advice given to you by your health care provider. Make sure you discuss any questions you have with your health care provider. Document Revised: 02/04/2024 Document Reviewed: 02/04/2024 Elsevier Patient Education  2025 [employer].

## 2025-01-17 ENCOUNTER — Ambulatory Visit: Admitting: Radiation Oncology

## 2025-01-26 ENCOUNTER — Ambulatory Visit: Admitting: Radiation Oncology

## 2025-02-28 ENCOUNTER — Ambulatory Visit: Admitting: Oncology

## 2025-02-28 ENCOUNTER — Other Ambulatory Visit

## 2025-10-25 ENCOUNTER — Encounter

## 2025-10-25 ENCOUNTER — Ambulatory Visit: Admitting: Plastic Surgery
